# Patient Record
Sex: Female | Born: 1990 | Race: White | Hispanic: No | Marital: Married | Smoking: Former smoker
Health system: Southern US, Community
[De-identification: ages and names within clinical notes are randomized; demographics above are authoritative.]

## PROBLEM LIST (undated history)

## (undated) DIAGNOSIS — O24419 Gestational diabetes mellitus in pregnancy, unspecified control: Secondary | ICD-10-CM

## (undated) DIAGNOSIS — O09299 Supervision of pregnancy with other poor reproductive or obstetric history, unspecified trimester: Secondary | ICD-10-CM

## (undated) DIAGNOSIS — O139 Gestational [pregnancy-induced] hypertension without significant proteinuria, unspecified trimester: Secondary | ICD-10-CM

## (undated) DIAGNOSIS — L309 Dermatitis, unspecified: Secondary | ICD-10-CM

## (undated) DIAGNOSIS — Z8759 Personal history of other complications of pregnancy, childbirth and the puerperium: Secondary | ICD-10-CM

## (undated) HISTORY — DX: Dermatitis, unspecified: L30.9

---

## 2016-06-07 LAB — OB RESULTS CONSOLE ABO/RH: RH Type: POSITIVE

## 2016-06-07 LAB — OB RESULTS CONSOLE ANTIBODY SCREEN: ANTIBODY SCREEN: NEGATIVE

## 2016-06-07 LAB — OB RESULTS CONSOLE PLATELET COUNT: Platelets: 198 10*3/uL

## 2016-06-07 LAB — OB RESULTS CONSOLE GC/CHLAMYDIA
CHLAMYDIA, DNA PROBE: NEGATIVE
Gonorrhea: NEGATIVE

## 2016-06-07 LAB — OB RESULTS CONSOLE HEPATITIS B SURFACE ANTIGEN: HEP B S AG: NEGATIVE

## 2016-06-07 LAB — OB RESULTS CONSOLE HIV ANTIBODY (ROUTINE TESTING): HIV: NONREACTIVE

## 2016-06-07 LAB — OB RESULTS CONSOLE HGB/HCT, BLOOD
HCT: 37 %
HEMOGLOBIN: 12.8 g/dL

## 2016-06-07 LAB — OB RESULTS CONSOLE RUBELLA ANTIBODY, IGM: Rubella: NON-IMMUNE/NOT IMMUNE

## 2016-06-07 LAB — OB RESULTS CONSOLE RPR: RPR: NONREACTIVE

## 2016-09-12 ENCOUNTER — Encounter: Payer: Self-pay | Admitting: Certified Nurse Midwife

## 2016-09-12 ENCOUNTER — Ambulatory Visit (INDEPENDENT_AMBULATORY_CARE_PROVIDER_SITE_OTHER): Payer: 59 | Admitting: Certified Nurse Midwife

## 2016-09-12 VITALS — BP 110/62 | Ht 62.0 in | Wt 160.0 lb

## 2016-09-12 DIAGNOSIS — Z283 Underimmunization status: Secondary | ICD-10-CM

## 2016-09-12 DIAGNOSIS — O9989 Other specified diseases and conditions complicating pregnancy, childbirth and the puerperium: Secondary | ICD-10-CM

## 2016-09-12 DIAGNOSIS — O09899 Supervision of other high risk pregnancies, unspecified trimester: Secondary | ICD-10-CM

## 2016-09-12 DIAGNOSIS — Z3402 Encounter for supervision of normal first pregnancy, second trimester: Secondary | ICD-10-CM

## 2016-09-12 NOTE — Progress Notes (Signed)
New Obstetric Patient H&P    Chief Complaint: "[facility]ring  [facility]"   History of Present Illness: Patient is a 26 y.o. G1P0 Caucasian female, LMP 04/11/2016 presents with husband [name] to [facility] [place]. They will be moving to [place] in the near future. Patient is a Customer service managerreal estate agent.. Based on her  LMP, her EDD is 01/16/17 and her EGA is 6675w0d. Her EDC was confirmed with an 8 week ultrasound.  She had a normal anatomy scan at 18 weeks. Review of her records was accomplished. Labs remarkable for A POS blood type, Rubella non immune, and a negative first trimester test.  CF, SMA, and Fragile X testing was also negative.  Her prenatal care has been uncomplicated thus far.  Her past medical history is notable for eczema   Since her LMP, she admits to the use of tobacco products  No. She is a former smoker. Stopped smoking last year. She claims she has gained   20 pounds since the start of her pregnancy.  There are cats in the home in the home  She admits close contact with children on a regular basis  no  She has had chicken pox in the past yes She has had Tuberculosis exposures, symptoms, or previously tested positive for TB   no Current or past history of domestic violence. no  Genetic Screening/Teratology Counseling: (Includes patient, baby's father, or anyone in either family with:)   1. [facility]'s [facility]= [facility]  no 2. Thalassemia ([place], [place], Mediterranean, or Asian background): MCV<80  no 3. Neural tube defect (meningomyelocele, spina bifida, anencephaly)  no 4. Congenital heart defect  no  5. Down syndrome  no 6. Tay-Sachs (Jewish, [place])  no 7. Canavan's Disease  no 8. Sickle cell disease or trait (African)  no  9. Hemophilia or other blood disorders  no  10. Muscular dystrophy  no  11. Cystic fibrosis  no  12. Huntington's Chorea  no  13. Mental retardation/autism  no 14. Other inherited  genetic or chromosomal disorder  no 15. Maternal metabolic disorder (DM, PKU, etc)  no 16. Patient or FOB with a child with a birth defect not listed above no  16a. Patient or FOB with a birth defect themselves no 17. Recurrent pregnancy loss, or stillbirth  no  18. Any medications since LMP other than prenatal vitamins (include vitamins, supplements, OTC meds, drugs, alcohol)  no 19. Any other genetic/environmental exposure to discuss  no  Infection History:   1. Lives with someone with TB or TB exposed  no  2. Patient or partner has history of genital herpes  no 3. Rash or viral illness since LMP  no 4. History of STI (GC, CT, HPV, syphilis, HIV)  no 5. History of recent travel :  no  Other pertinent information:  yes [name] is husband    Review of Systems:10 point review of systems negative unless otherwise noted in HPI  Past Medical History:  Past Medical History:  Diagnosis Date  . Eczema     Past Surgical History:  Past Surgical History:  Procedure Laterality Date  . NO PAST SURGERIES      Gynecologic History: Patient's last menstrual period was 04/11/2016 (lmp unknown).  Obstetric History: G1P0  Family History:  Family History  Problem Relation Age of Onset  . Breast cancer Neg Hx   . Ovarian cancer Neg Hx  Social History:  Social History   Social History  . Marital status: Married    Spouse name: Deniece Portela  . Number of children: N/A  . Years of education: 57   Occupational History  . Real estate    Social History Main Topics  . Smoking status: Former Smoker    Years: 2.00  . Smokeless tobacco: Never Used     Comment: 1 pack/week-quit 2017  . Alcohol use Yes     Comment: prior to pregnancy  . Drug use: No  . Sexual activity: Yes    Partners: Male   Other Topics Concern  . Not on file   Social History Narrative  . No narrative on file    Allergies:  No Known Allergies  Medications: Prior to Admission medications   Medication Sig  Start Date End Date Taking? Authorizing Provider  Prenatal Vit-Fe Fumarate-FA (MULTIVITAMIN-PRENATAL) 27-0.8 MG TABS tablet Take 1 tablet by mouth daily at 12 noon.   Yes [provider]    Physical Exam Vitals: Blood pressure 110/62, height 5\' 2"  (1.575 m), weight 160 lb (72.6 kg), last menstrual period 04/11/2016.  General: NAD Abdomen: soft, non-tender, FH at 23 cm. FHTs 145. FM palpable Extremities: no edema, erythema, or tenderness Neurologic: Grossly intact Psychiatric: mood appropriate, affect full   Assessment: 26 y.o. G1P0 at [redacted]w[redacted]d presenting to transfer  prenatal care to [facility] Low risk pregnancy Plan: 1) Avoid alcoholic beverages. 2) Patient encouraged not to smoke.  3) Discontinue the use of all non-medicinal drugs and chemicals.  4) Take prenatal vitamins daily.  5) Hospital and practice style delivering at [facility] discussed  6) Patient is asked about travel to areas at risk for the Zika virus, and counseled to avoid travel and exposure to mosquitoes or sexual partners who may have themselves been exposed to the virus. Testing is discussed, and will be ordered as appropriate.  7) Given schedule of childbirth classes at [facility] 8) RTO in 4 weeks for ROB    Patient ID: Michelle Schultz, female   DOB: Apr 22, 1991, 26 y.o.   MRN: [id number]

## 2016-09-12 NOTE — Progress Notes (Signed)
[facility]

## 2016-09-14 DIAGNOSIS — Z98891 History of uterine scar from previous surgery: Secondary | ICD-10-CM | POA: Insufficient documentation

## 2016-09-14 DIAGNOSIS — O09899 Supervision of other high risk pregnancies, unspecified trimester: Secondary | ICD-10-CM | POA: Insufficient documentation

## 2016-09-14 DIAGNOSIS — Z2839 Other underimmunization status: Secondary | ICD-10-CM | POA: Insufficient documentation

## 2016-09-14 DIAGNOSIS — Z283 Underimmunization status: Secondary | ICD-10-CM | POA: Insufficient documentation

## 2016-09-14 DIAGNOSIS — O9989 Other specified diseases and conditions complicating pregnancy, childbirth and the puerperium: Secondary | ICD-10-CM

## 2016-09-14 DIAGNOSIS — O34219 Maternal care for unspecified type scar from previous cesarean delivery: Secondary | ICD-10-CM | POA: Insufficient documentation

## 2016-09-14 NOTE — Progress Notes (Signed)
[place] at [redacted] weeks gestation. See progress note. Prenatal records reviewed: Low risk pregnancy ROB in 4 weeks.

## 2016-09-29 ENCOUNTER — Ambulatory Visit (INDEPENDENT_AMBULATORY_CARE_PROVIDER_SITE_OTHER): Payer: 59 | Admitting: Obstetrics & Gynecology

## 2016-09-29 VITALS — BP 120/80 | Wt 166.0 lb

## 2016-09-29 DIAGNOSIS — Z3A24 24 weeks gestation of pregnancy: Secondary | ICD-10-CM

## 2016-09-29 DIAGNOSIS — Z3402 Encounter for supervision of normal first pregnancy, second trimester: Secondary | ICD-10-CM

## 2016-09-29 NOTE — Progress Notes (Signed)
PNV, FMC, Glucola nv. Breast feeding.  Plans pill or Depo.

## 2016-09-29 NOTE — Patient Instructions (Signed)

## 2016-10-27 ENCOUNTER — Ambulatory Visit (INDEPENDENT_AMBULATORY_CARE_PROVIDER_SITE_OTHER): Payer: 59 | Admitting: Obstetrics and Gynecology

## 2016-10-27 ENCOUNTER — Other Ambulatory Visit: Payer: 59

## 2016-10-27 VITALS — BP 110/70 | Wt 173.0 lb

## 2016-10-27 DIAGNOSIS — Z3A28 28 weeks gestation of pregnancy: Secondary | ICD-10-CM

## 2016-10-27 DIAGNOSIS — Z3402 Encounter for supervision of normal first pregnancy, second trimester: Secondary | ICD-10-CM

## 2016-10-27 DIAGNOSIS — Z3A24 24 weeks gestation of pregnancy: Secondary | ICD-10-CM

## 2016-10-27 NOTE — Progress Notes (Signed)
Pos PNVs, No VB, LOF. Doing well. 28 wks labs today.

## 2016-10-28 ENCOUNTER — Other Ambulatory Visit: Payer: Self-pay | Admitting: Obstetrics & Gynecology

## 2016-10-28 DIAGNOSIS — O24419 Gestational diabetes mellitus in pregnancy, unspecified control: Secondary | ICD-10-CM

## 2016-10-28 LAB — 28 WEEK RH+PANEL
BASOS ABS: 0 10*3/uL (ref 0.0–0.2)
BASOS: 0 %
EOS (ABSOLUTE): 0.1 10*3/uL (ref 0.0–0.4)
EOS: 1 %
GESTATIONAL DIABETES SCREEN: 172 mg/dL — AB (ref 65–139)
HEMATOCRIT: 32.5 % — AB (ref 34.0–46.6)
HIV SCREEN 4TH GENERATION: NONREACTIVE
Hemoglobin: 11 g/dL — ABNORMAL LOW (ref 11.1–15.9)
Immature Grans (Abs): 0.1 10*3/uL (ref 0.0–0.1)
Immature Granulocytes: 1 %
LYMPHS ABS: 1.6 10*3/uL (ref 0.7–3.1)
Lymphs: 19 %
MCH: 29.6 pg (ref 26.6–33.0)
MCHC: 33.8 g/dL (ref 31.5–35.7)
MCV: 87 fL (ref 79–97)
MONOCYTES: 4 %
Monocytes Absolute: 0.3 10*3/uL (ref 0.1–0.9)
NEUTROS ABS: 6.2 10*3/uL (ref 1.4–7.0)
Neutrophils: 75 %
PLATELETS: 136 10*3/uL — AB (ref 150–379)
RBC: 3.72 x10E6/uL — AB (ref 3.77–5.28)
RDW: 13.5 % (ref 12.3–15.4)
RPR: NONREACTIVE
WBC: 8.3 10*3/uL (ref 3.4–10.8)

## 2016-10-28 NOTE — Progress Notes (Signed)
Schedule 3 hour GTT due to abnormal screening lab from this week.  This is to rule out diabetes.  Thank you.

## 2016-10-31 ENCOUNTER — Telehealth: Payer: Self-pay | Admitting: Obstetrics & Gynecology

## 2016-10-31 NOTE — Telephone Encounter (Signed)
Pt is schedule 11/07/16 for lab

## 2016-10-31 NOTE — Telephone Encounter (Signed)
-----   Message from Nadara Mustardobert P Harris, MD sent at 10/28/2016  9:43 AM EDT ----- Schedule 3 hour GTT due to abnormal screening lab from this week.  This is to rule out diabetes.  Thank you.

## 2016-11-07 ENCOUNTER — Other Ambulatory Visit: Payer: 59

## 2016-11-07 DIAGNOSIS — O24419 Gestational diabetes mellitus in pregnancy, unspecified control: Secondary | ICD-10-CM

## 2016-11-08 LAB — GESTATIONAL GLUCOSE TOLERANCE
GLUCOSE 1 HOUR GTT: 162 mg/dL (ref 65–179)
GLUCOSE 2 HOUR GTT: 133 mg/dL (ref 65–154)
GLUCOSE FASTING: 86 mg/dL (ref 65–94)
Glucose, GTT - 3 Hour: 104 mg/dL (ref 65–139)

## 2016-11-11 ENCOUNTER — Ambulatory Visit (INDEPENDENT_AMBULATORY_CARE_PROVIDER_SITE_OTHER): Payer: 59 | Admitting: Obstetrics and Gynecology

## 2016-11-11 VITALS — BP 114/70 | Wt 176.0 lb

## 2016-11-11 DIAGNOSIS — Z3403 Encounter for supervision of normal first pregnancy, third trimester: Secondary | ICD-10-CM

## 2016-11-11 DIAGNOSIS — Z3A30 30 weeks gestation of pregnancy: Secondary | ICD-10-CM

## 2016-11-11 DIAGNOSIS — Z283 Underimmunization status: Secondary | ICD-10-CM

## 2016-11-11 DIAGNOSIS — O9989 Other specified diseases and conditions complicating pregnancy, childbirth and the puerperium: Secondary | ICD-10-CM

## 2016-11-11 DIAGNOSIS — O09899 Supervision of other high risk pregnancies, unspecified trimester: Secondary | ICD-10-CM

## 2016-11-11 NOTE — Progress Notes (Signed)
Prenatal Visit Note Date: 11/11/2016 Clinic: [facility]  Subjective:  Michelle Schultz is a 26 y.o. G1P0 at 7239w4d being seen today for ongoing prenatal care.  She is currently monitored for the following issues for this low-risk pregnancy and has Supervision of low-risk first pregnancy and Rubella non-immune status, antepartum on her problem list.  Patient reports no bleeding, no contractions and no leaking.   Contractions: Not present. Vag. Bleeding: None.  Movement: Present. Denies leaking of fluid.   The following portions of the patient's history were reviewed and updated as appropriate: allergies, current medications, past family history, past medical history, past social history, past surgical history and problem list. Problem list updated.  Objective:   Vitals:   11/11/16 1544  BP: 114/70  Weight: 176 lb (79.8 kg)    Fetal Status: Fetal Heart Rate (bpm): 145 Fundal Height: 30 cm Movement: Present     General:  Alert, oriented and cooperative. Patient is in no acute distress.  Skin: Skin is warm and dry. No rash noted.   Cardiovascular: Normal heart rate noted  Respiratory: Normal respiratory effort, no problems with respiration noted  Abdomen: Soft, gravid, appropriate for gestational age. Pain/Pressure: Absent     Pelvic:  Cervical exam deferred        Extremities: Normal range of motion.  Edema: None  Mental Status: Normal mood and affect. Normal behavior. Normal judgment and thought content.   Urinalysis: Urine Protein: Negative Urine Glucose: Negative  Assessment and Plan:  Pregnancy: G1P0 at 2439w4d  1. Encounter for supervision of low-risk first pregnancy in third trimester 2. Rubella non-immune status, antepartum 3. [redacted] weeks gestation of pregnancy  Preterm labor symptoms and general obstetric precautions including but not limited to vaginal bleeding, contractions, leaking of fluid and fetal movement were reviewed in detail with the patient. Please refer to After  Visit Summary for other counseling recommendations.  Return in about 2 weeks (around 11/25/2016) for Routine Prenatal Appointment.  Thomasene MohairStephen Sharea Guinther, MD 11/11/2016 4:10 PM

## 2016-11-25 ENCOUNTER — Encounter: Payer: 59 | Admitting: Advanced Practice Midwife

## 2016-11-25 ENCOUNTER — Ambulatory Visit (INDEPENDENT_AMBULATORY_CARE_PROVIDER_SITE_OTHER): Payer: 59 | Admitting: Advanced Practice Midwife

## 2016-11-25 VITALS — BP 118/74 | Wt 174.0 lb

## 2016-11-25 DIAGNOSIS — Z3A32 32 weeks gestation of pregnancy: Secondary | ICD-10-CM

## 2016-11-25 NOTE — Progress Notes (Signed)
Doing well. Questions regarding circumcision, TDAP for family members, postdates protocol, size of baby answered. Breastfeeding and [facility]. No LOF, VB.

## 2016-12-09 ENCOUNTER — Ambulatory Visit (INDEPENDENT_AMBULATORY_CARE_PROVIDER_SITE_OTHER): Payer: 59 | Admitting: Obstetrics and Gynecology

## 2016-12-09 VITALS — BP 108/68 | Wt 181.0 lb

## 2016-12-09 DIAGNOSIS — Z3A34 34 weeks gestation of pregnancy: Secondary | ICD-10-CM

## 2016-12-09 DIAGNOSIS — Z3403 Encounter for supervision of normal first pregnancy, third trimester: Secondary | ICD-10-CM

## 2016-12-09 DIAGNOSIS — Z23 Encounter for immunization: Secondary | ICD-10-CM

## 2016-12-09 NOTE — Progress Notes (Signed)
Routine Prenatal Care Visit  Subjective  Michelle Schultz is a 26 y.o. G1P0 at 57w4dbeing seen today for ongoing prenatal care.  She is currently monitored for the following issues for this low-risk pregnancy and has Supervision of low-risk first pregnancy and Rubella non-immune status, antepartum on her problem list.  ----------------------------------------------------------------------------------- Patient reports .   Contractions: Not present. Vag. Bleeding: None.  Movement: Present. Denies leaking of fluid.  ----------------------------------------------------------------------------------- The following portions of the patient's history were reviewed and updated as appropriate: allergies, current medications, past family history, past medical history, past social history, past surgical history and problem list. Problem list updated.  Objective  Blood pressure 108/68, weight 181 lb (82.1 kg), last menstrual period 04/11/2016. Pregravid weight 140 lb (63.5 kg) Total Weight Gain 41 lb (18.6 kg) Urinalysis: Urine Protein: Negative Urine Glucose: Negative  Fetal Status: Fetal Heart Rate (bpm): 135 Fundal Height: 34 cm Movement: Present     General:  Alert, oriented and cooperative. Patient is in no acute distress.  Skin: Skin is warm and dry. No rash noted.   Cardiovascular: Normal heart rate noted  Respiratory: Normal respiratory effort, no problems with respiration noted  Abdomen: Soft, gravid, appropriate for gestational age. Pain/Pressure: Absent     Pelvic:  Cervical exam deferred        Extremities: Normal range of motion.     ental Status: Normal mood and affect. Normal behavior. Normal judgment and thought content.   Assessment   26y.o. G1P0 at 310w4dy  01/16/2017, by Last Menstrual Period presenting for routine prenatal visit  Plan   pregnancy #1 Problems (from 04/17/16 to present)    Problem Noted Resolved   Supervision of low-risk first pregnancy 09/14/2016 by  GuDalia HeadingCNM No   Overview Addendum 12/09/2016  2:11 PM by JaWill BonnetMD     Clinic  Prenatal Labs  Dating LMP=8wk ultrasound Blood type: A/Positive/-- (02/06 0000)   Genetic Screen 1 Screen: neg   AFP:     Quad:     NIPS: Antibody:Negative (02/06 0000)  Anatomic [place]/18/2018 WNL, posterior placenta Rubella: Nonimmune (02/06 0000)  GTT Third trimester: 172, passed 3h gtt all values normal RPR: Nonreactive (02/06 0000)   Flu vaccine  HBsAg: Negative (02/06 0000)   TDaP vaccine  12/09/16                            Rhogam: HIV: Non-reactive (02/06 0000)   Baby Food                                               GBS: (For PCN allergy, check sensitivities)  Contraception  Pap:  Circumcision    Pediatrician    Support Person              Rubella non-immune status, antepartum 09/14/2016 by GuDalia HeadingCNM No   Overview Signed 09/14/2016  8:15 PM by GuDalia HeadingCNM    MMR postpartum        Preterm labor symptoms and general obstetric precautions including but not limited to vaginal bleeding, contractions, leaking of fluid and fetal movement were reviewed in detail with the patient. Please refer to After Visit Summary for other counseling recommendations.   Return in about 2 weeks (around 12/23/2016) for Routine Prenatal Appointment.  [name]  12/09/2016 2:11 PM

## 2016-12-22 ENCOUNTER — Encounter: Payer: 59 | Admitting: Advanced Practice Midwife

## 2016-12-23 ENCOUNTER — Ambulatory Visit (INDEPENDENT_AMBULATORY_CARE_PROVIDER_SITE_OTHER): Payer: 59 | Admitting: Maternal Newborn

## 2016-12-23 VITALS — BP 120/80 | Wt 184.0 lb

## 2016-12-23 DIAGNOSIS — Z283 Underimmunization status: Secondary | ICD-10-CM

## 2016-12-23 DIAGNOSIS — O9989 Other specified diseases and conditions complicating pregnancy, childbirth and the puerperium: Secondary | ICD-10-CM

## 2016-12-23 DIAGNOSIS — Z3A36 36 weeks gestation of pregnancy: Secondary | ICD-10-CM

## 2016-12-23 DIAGNOSIS — Z3403 Encounter for supervision of normal first pregnancy, third trimester: Secondary | ICD-10-CM

## 2016-12-23 DIAGNOSIS — Z2839 Other underimmunization status: Secondary | ICD-10-CM

## 2016-12-23 NOTE — Addendum Note (Signed)
Addended by: Marcelyn Bruins on: 12/23/2016 01:41 PM   Modules accepted: Orders

## 2016-12-23 NOTE — Progress Notes (Signed)
    Routine Prenatal Care Visit  Subjective  Kaleiah Kutzer is a 26 y.o. G1P0 at 60w4dbeing seen today for ongoing prenatal care.  She is currently monitored for the following issues for this low-risk pregnancy and has Supervision of low-risk first pregnancy and Rubella non-immune status, antepartum on her problem list.  ----------------------------------------------------------------------------------- Patient reports no complaints.   Denies leaking of fluid, vaginal bleeding, contractions. Good fetal movement. ----------------------------------------------------------------------------------- The following portions of the patient's history were reviewed and updated as appropriate: allergies, current medications, past family history, past medical history, past social history, past surgical history and problem list. Problem list updated.   Objective  Last menstrual period 04/11/2016. Pregravid weight 140 lb (63.5 kg) Total Weight Gain 44 lb (20 kg) Urinalysis: Urine Protein: Negative Urine Glucose: Negative  Fetal Status: Fetal Heart Rate (bpm): 160 Fundal Height: 35 cm Movement: Present     General:  Alert, oriented and cooperative. Patient is in no acute distress.  Skin: Skin is warm and dry. No rash noted.   Cardiovascular: Normal heart rate noted  Respiratory: Normal respiratory effort, no problems with respiration noted  Abdomen: Soft, gravid, appropriate for gestational age. Pain/Pressure: Absent     Pelvic:  Cervical exam deferred        Extremities: Normal range of motion.  Edema: None  ental Status: Normal mood and affect. Normal behavior. Normal judgment and thought content.     Assessment   26y.o. G1P0 at 330w4dy  01/16/2017, by Last Menstrual Period presenting for routine prenatal visit  Plan   Pregnancy #1 Problems (from 04/17/16 to present)    Problem Noted Resolved   Supervision of low-risk first pregnancy 09/14/2016 by GuDalia HeadingCNM No   Overview  Addendum 12/09/2016  2:11 PM by JaWill BonnetMD     Clinic [facility] Prenatal Labs  Dating LMP=8wk ultrasound Blood type: A/Positive/-- (02/06 0000)   Genetic Screen 1 Screen: neg   AFP:     Quad:     NIPS: Antibody:Negative (02/06 0000)  Anatomic [place]/18/2018 WNL, posterior placenta Rubella: Nonimmune (02/06 0000)  GTT Third trimester: 172, passed 3h gtt all values normal RPR: Nonreactive (02/06 0000)   Flu vaccine  HBsAg: Negative (02/06 0000)   TDaP vaccine  12/09/16                            Rhogam: HIV: Non-reactive (02/06 0000)   Baby Food Breast                                    GBS: (For PCN allergy, check sensitivities)  Contraception Pill/depo Pap:  Circumcision    Pediatrician    Support Person              Rubella non-immune status, antepartum 09/14/2016 by GuDalia HeadingCNM No   Overview Signed 09/14/2016  8:15 PM by GuDalia HeadingCNM    MMR postpartum          Preterm labor symptoms and general obstetric precautions including but not limited to vaginal bleeding, contractions, leaking of fluid and fetal movement were reviewed in detail with the patient. Please refer to After Visit Summary for other counseling recommendations.   Return in about 1 week (around 12/30/2016) for [place]  JaRexene Agent

## 2016-12-25 LAB — STREP GP B NAA: Strep Gp B NAA: POSITIVE — AB

## 2016-12-30 ENCOUNTER — Ambulatory Visit (INDEPENDENT_AMBULATORY_CARE_PROVIDER_SITE_OTHER): Payer: 59 | Admitting: Advanced Practice Midwife

## 2016-12-30 VITALS — BP 128/80 | Wt 186.0 lb

## 2016-12-30 DIAGNOSIS — Z3A37 37 weeks gestation of pregnancy: Secondary | ICD-10-CM

## 2016-12-30 NOTE — Progress Notes (Signed)
ROB

## 2016-12-30 NOTE — Progress Notes (Signed)
Good fetal movement, no complaints. Denies LOF, VB, CTX's. Discussed GBS positive status and protocol. Patient and husband aware of labor precautions. ROB in 1 week.

## 2017-01-06 ENCOUNTER — Ambulatory Visit (INDEPENDENT_AMBULATORY_CARE_PROVIDER_SITE_OTHER): Payer: 59 | Admitting: Maternal Newborn

## 2017-01-06 VITALS — BP 120/80 | Wt 187.0 lb

## 2017-01-06 DIAGNOSIS — Z3403 Encounter for supervision of normal first pregnancy, third trimester: Secondary | ICD-10-CM

## 2017-01-06 DIAGNOSIS — Z3A38 38 weeks gestation of pregnancy: Secondary | ICD-10-CM

## 2017-01-06 DIAGNOSIS — Z283 Underimmunization status: Secondary | ICD-10-CM

## 2017-01-06 DIAGNOSIS — Z2839 Other underimmunization status: Secondary | ICD-10-CM

## 2017-01-06 DIAGNOSIS — O9989 Other specified diseases and conditions complicating pregnancy, childbirth and the puerperium: Secondary | ICD-10-CM

## 2017-01-06 NOTE — Progress Notes (Signed)
Routine Prenatal Care Visit  Subjective  Michelle Schultz is a 26 y.o. G1P0 at [redacted]w[redacted]d being seen today for ongoing prenatal care.  She is currently monitored for the following issues for this low-risk pregnancy and has Supervision of low-risk first pregnancy; Rubella non-immune status, antepartum; and [redacted] weeks gestation of pregnancy on her problem list.  ----------------------------------------------------------------------------------- Patient reports no complaints.   Contractions: Not present. Vag. Bleeding: None.  Movement: Present. Denies leaking of fluid.  ----------------------------------------------------------------------------------- The following portions of the patient's history were reviewed and updated as appropriate: allergies, current medications, past family history, past medical history, past social history, past surgical history and problem list. Problem list updated.   Objective  Last menstrual period 04/11/2016. Pregravid weight 140 lb (63.5 kg) Total Weight Gain 47 lb (21.3 kg)  Fetal Status: Fetal Heart Rate (bpm): 145 Fundal Height: 38 cm Movement: Present     General:  Alert, oriented and cooperative. Patient is in no acute distress.  Skin: Skin is warm and dry. No rash noted.   Cardiovascular: Normal heart rate noted  Respiratory: Normal respiratory effort, no problems with respiration noted  Abdomen: Soft, gravid, appropriate for gestational age. Pain/Pressure: Present     Pelvic:  Cervical exam performed Dilation: Closed Effacement (%): Thick Station: Ballotable  Extremities: Normal range of motion.     ental Status: Normal mood and affect. Normal behavior. Normal judgment and thought content.     Assessment   26 y.o. G1P0 at [redacted]w[redacted]d by  01/16/2017, by Last Menstrual Period presenting for routine prenatal visit  Plan   pregnancy #1 Problems (from 04/17/16 to present)    Problem Noted Resolved   Supervision of low-risk first pregnancy 09/14/2016 by  Gutierrez, Colleen, CNM No   Overview Addendum 01/06/2017 11:12 AM by Schmid, Jacelyn Y, CNM     Clinic [facility] Prenatal Labs  Dating LMP=8wk ultrasound Blood type: A/Positive/-- (02/06 0000)   Genetic Screen 1 Screen: neg   AFP:     Quad:     NIPS: Antibody:Negative (02/06 0000)  Anatomic US 08/17/2016 WNL, posterior placenta Rubella: Nonimmune (02/06 0000)  GTT Third trimester: 172, passed 3h gtt all values normal RPR: Non Reactive (06/28 0940)   Flu vaccine  HBsAg: Negative (02/06 0000)   TDaP vaccine  12/09/16                            Rhogam: N/A HIV: Non-reactive (02/06 0000)   Baby Food Breast                                      GBS:Positive (08/24 1515)(For PCN allergy, check sensitivities)  Contraception Pill/depo Pap:  Circumcision    Pediatrician    Support Person              Rubella non-immune status, antepartum 09/14/2016 by Gutierrez, Colleen, CNM No   Overview Signed 09/14/2016  8:15 PM by Gutierrez, Colleen, CNM    MMR postpartum          Term labor symptoms and general obstetric precautions including but not limited to vaginal bleeding, contractions, leaking of fluid and fetal movement were reviewed in detail with the patient.  Please refer to After Visit Summary for other counseling recommendations.   Return in about 1 week (around 01/13/2017) for ROB.   Jacelyn Schmid, CNM 01/06/2017  1:46 PM 

## 2017-01-12 ENCOUNTER — Ambulatory Visit (INDEPENDENT_AMBULATORY_CARE_PROVIDER_SITE_OTHER): Payer: 59 | Admitting: Maternal Newborn

## 2017-01-12 VITALS — BP 120/80 | Wt 186.0 lb

## 2017-01-12 DIAGNOSIS — Z3A39 39 weeks gestation of pregnancy: Secondary | ICD-10-CM

## 2017-01-12 DIAGNOSIS — Z3403 Encounter for supervision of normal first pregnancy, third trimester: Secondary | ICD-10-CM

## 2017-01-12 NOTE — Progress Notes (Signed)
  Routine Prenatal Care Visit  Subjective  Michelle Schultz is a 26 y.o. G1P0 at [redacted]w[redacted]d being seen today for ongoing prenatal care.  She is currently monitored for the following issues for this low-risk pregnancy and has Supervision of low-risk first pregnancy and Rubella non-immune status, antepartum on her problem list.  ----------------------------------------------------------------------------------- Patient reports no complaints.   Contractions: Not present. Vag. Bleeding: None.  Movement: Present. Denies leaking of fluid.  ----------------------------------------------------------------------------------- The following portions of the patient's history were reviewed and updated as appropriate: allergies, current medications, past family history, past medical history, past social history, past surgical history and problem list. Problem list updated.   Objective  Blood pressure 120/80, weight 186 lb (84.4 kg), last menstrual period 04/11/2016. Pregravid weight 140 lb (63.5 kg) Total Weight Gain 46 lb (20.9 kg) Urinalysis: Urine Protein: Negative Urine Glucose: Negative  Fetal Status: Fetal Heart Rate (bpm): 150 Fundal Height: 39 cm Movement: Present     General:  Alert, oriented and cooperative. Patient is in no acute distress.  Skin: Skin is warm and dry. No rash noted.   Cardiovascular: Normal heart rate noted  Respiratory: Normal respiratory effort, no problems with respiration noted  Abdomen: Soft, gravid, appropriate for gestational age. Pain/Pressure: Absent     Pelvic:  Cervical exam performed Dilation: Closed Effacement (%): 20 Station: -3  Extremities: Normal range of motion.  Edema: Trace  Mental Status: Normal mood and affect. Normal behavior. Normal judgment and thought content.     Assessment   26 y.o. G1P0 at [redacted]w[redacted]d by  01/16/2017, by Last Menstrual Period presenting for routine prenatal visit  Plan   pregnancy #1 Problems (from 04/17/16 to present)    Problem  Noted Resolved   Supervision of low-risk first pregnancy 09/14/2016 by Gutierrez, Colleen, CNM No   Overview Addendum 01/06/2017 11:12 AM by Schmid, Jacelyn Y, CNM     Clinic [facility] Prenatal Labs  Dating LMP=8wk ultrasound Blood type: A/Positive/-- (02/06 0000)   Genetic Screen 1 Screen: neg   AFP:     Quad:     NIPS: Antibody:Negative (02/06 0000)  Anatomic US 08/17/2016 WNL, posterior placenta Rubella: Nonimmune (02/06 0000)  GTT Third trimester: 172, passed 3h gtt all values normal RPR: Non Reactive (06/28 0940)   Flu vaccine  HBsAg: Negative (02/06 0000)   TDaP vaccine  12/09/16                            Rhogam: N/A HIV: Non-reactive (02/06 0000)   Baby Food Breast                                      GBS:Positive (08/24 1515)(For PCN allergy, check sensitivities)  Contraception Pill/depo Pap:  Circumcision    Pediatrician    Support Person              Rubella non-immune status, antepartum 09/14/2016 by Gutierrez, Colleen, CNM No   Overview Signed 09/14/2016  8:15 PM by Gutierrez, Colleen, CNM    MMR postpartum          Term labor symptoms and general obstetric precautions including but not limited to vaginal bleeding, contractions, leaking of fluid and fetal movement were reviewed in detail with the patient.  Average length of first time pregnancy and indications/timing of IOL discussed.  Return in about 1 week (around 01/19/2017) for ROB.  Jacelyn   Patrcia Dolly, CNM 01/12/2017  10:47 AM

## 2017-01-13 ENCOUNTER — Encounter: Payer: 59 | Admitting: Maternal Newborn

## 2017-01-19 ENCOUNTER — Inpatient Hospital Stay (HOSPITAL_COMMUNITY): Payer: 59 | Admitting: Anesthesiology

## 2017-01-19 ENCOUNTER — Ambulatory Visit (INDEPENDENT_AMBULATORY_CARE_PROVIDER_SITE_OTHER): Payer: 59 | Admitting: Maternal Newborn

## 2017-01-19 ENCOUNTER — Encounter (HOSPITAL_COMMUNITY): Payer: Self-pay | Admitting: *Deleted

## 2017-01-19 ENCOUNTER — Inpatient Hospital Stay (HOSPITAL_COMMUNITY)
Admission: AD | Admit: 2017-01-19 | Discharge: 2017-01-24 | DRG: 765 | Disposition: A | Discharge status: Home / Self Care | Payer: 59 | Source: Ambulatory Visit | Attending: Obstetrics & Gynecology | Admitting: Obstetrics & Gynecology

## 2017-01-19 ENCOUNTER — Inpatient Hospital Stay
Admission: EM | Admit: 2017-01-19 | Discharge: 2017-01-19 | DRG: 781 | Disposition: A | Discharge status: Nursing Home (Includes ICF) | Payer: 59 | Attending: Obstetrics and Gynecology | Admitting: Obstetrics and Gynecology

## 2017-01-19 ENCOUNTER — Other Ambulatory Visit: Payer: Self-pay | Admitting: Obstetrics and Gynecology

## 2017-01-19 VITALS — BP 156/120 | Wt 189.0 lb

## 2017-01-19 DIAGNOSIS — O99820 Streptococcus B carrier state complicating pregnancy: Secondary | ICD-10-CM | POA: Diagnosis present

## 2017-01-19 DIAGNOSIS — O1423 HELLP syndrome (HELLP), third trimester: Secondary | ICD-10-CM

## 2017-01-19 DIAGNOSIS — Z87891 Personal history of nicotine dependence: Secondary | ICD-10-CM

## 2017-01-19 DIAGNOSIS — O411230 Chorioamnionitis, third trimester, not applicable or unspecified: Secondary | ICD-10-CM | POA: Diagnosis not present

## 2017-01-19 DIAGNOSIS — R0602 Shortness of breath: Secondary | ICD-10-CM

## 2017-01-19 DIAGNOSIS — O163 Unspecified maternal hypertension, third trimester: Secondary | ICD-10-CM

## 2017-01-19 DIAGNOSIS — Z3A40 40 weeks gestation of pregnancy: Secondary | ICD-10-CM

## 2017-01-19 DIAGNOSIS — D62 Acute posthemorrhagic anemia: Secondary | ICD-10-CM | POA: Diagnosis not present

## 2017-01-19 DIAGNOSIS — Z2839 Other underimmunization status: Secondary | ICD-10-CM

## 2017-01-19 DIAGNOSIS — O98919 Unspecified maternal infectious and parasitic disease complicating pregnancy, unspecified trimester: Secondary | ICD-10-CM | POA: Diagnosis not present

## 2017-01-19 DIAGNOSIS — Z98891 History of uterine scar from previous surgery: Secondary | ICD-10-CM

## 2017-01-19 DIAGNOSIS — Z880 Allergy status to penicillin: Secondary | ICD-10-CM

## 2017-01-19 DIAGNOSIS — O09899 Supervision of other high risk pregnancies, unspecified trimester: Secondary | ICD-10-CM

## 2017-01-19 DIAGNOSIS — D6959 Other secondary thrombocytopenia: Secondary | ICD-10-CM | POA: Diagnosis not present

## 2017-01-19 DIAGNOSIS — O99824 Streptococcus B carrier state complicating childbirth: Secondary | ICD-10-CM | POA: Diagnosis present

## 2017-01-19 DIAGNOSIS — O9989 Other specified diseases and conditions complicating pregnancy, childbirth and the puerperium: Secondary | ICD-10-CM

## 2017-01-19 DIAGNOSIS — Z3403 Encounter for supervision of normal first pregnancy, third trimester: Secondary | ICD-10-CM

## 2017-01-19 DIAGNOSIS — O9912 Other diseases of the blood and blood-forming organs and certain disorders involving the immune mechanism complicating childbirth: Secondary | ICD-10-CM | POA: Diagnosis present

## 2017-01-19 DIAGNOSIS — O1424 HELLP syndrome, complicating childbirth: Principal | ICD-10-CM | POA: Diagnosis present

## 2017-01-19 DIAGNOSIS — O9081 Anemia of the puerperium: Secondary | ICD-10-CM | POA: Diagnosis not present

## 2017-01-19 DIAGNOSIS — R7989 Other specified abnormal findings of blood chemistry: Secondary | ICD-10-CM | POA: Diagnosis not present

## 2017-01-19 DIAGNOSIS — R030 Elevated blood-pressure reading, without diagnosis of hypertension: Secondary | ICD-10-CM | POA: Diagnosis present

## 2017-01-19 DIAGNOSIS — Z283 Underimmunization status: Secondary | ICD-10-CM

## 2017-01-19 DIAGNOSIS — O34219 Maternal care for unspecified type scar from previous cesarean delivery: Secondary | ICD-10-CM

## 2017-01-19 DIAGNOSIS — O324XX0 Maternal care for high head at term, not applicable or unspecified: Secondary | ICD-10-CM | POA: Diagnosis present

## 2017-01-19 LAB — PROTEIN / CREATININE RATIO, URINE
Creatinine, Urine: 48 mg/dL
PROTEIN CREATININE RATIO: 0.27 mg/mg{creat} — AB (ref 0.00–0.15)
Total Protein, Urine: 13 mg/dL

## 2017-01-19 LAB — COMPREHENSIVE METABOLIC PANEL
ALBUMIN: 3.1 g/dL — AB (ref 3.5–5.0)
ALK PHOS: 230 U/L — AB (ref 38–126)
ALT: 16 U/L (ref 14–54)
ALT: 17 U/L (ref 14–54)
AST: 22 U/L (ref 15–41)
AST: 22 U/L (ref 15–41)
Albumin: 3.2 g/dL — ABNORMAL LOW (ref 3.5–5.0)
Alkaline Phosphatase: 201 U/L — ABNORMAL HIGH (ref 38–126)
Anion gap: 10 (ref 5–15)
Anion gap: 12 (ref 5–15)
BILIRUBIN TOTAL: 0.5 mg/dL (ref 0.3–1.2)
BUN: 9 mg/dL (ref 6–20)
BUN: 7 mg/dL (ref 6–20)
CALCIUM: 9 mg/dL (ref 8.9–10.3)
CALCIUM: 8 mg/dL — AB (ref 8.9–10.3)
CHLORIDE: 109 mmol/L (ref 101–111)
CO2: 17 mmol/L — ABNORMAL LOW (ref 22–32)
CO2: 18 mmol/L — AB (ref 22–32)
CREATININE: 0.73 mg/dL (ref 0.44–1.00)
CREATININE: 0.67 mg/dL (ref 0.44–1.00)
Chloride: 108 mmol/L (ref 101–111)
GFR calc Af Amer: >60 mL/min (ref >60)
GFR calc non Af Amer: >60 mL/min (ref >60)
GLUCOSE: 97 mg/dL (ref 65–99)
Glucose, Bld: 88 mg/dL (ref 65–99)
Potassium: 3.7 mmol/L (ref 3.5–5.1)
Potassium: 3.8 mmol/L (ref 3.5–5.1)
SODIUM: 138 mmol/L (ref 135–145)
Sodium: 136 mmol/L (ref 135–145)
TOTAL PROTEIN: 6.7 g/dL (ref 6.5–8.1)
Total Bilirubin: 0.5 mg/dL (ref 0.3–1.2)
Total Protein: 6.7 g/dL (ref 6.5–8.1)

## 2017-01-19 LAB — CBC WITH DIFFERENTIAL/PLATELET
BASOS ABS: 0 10*3/uL (ref 0–0.1)
Basophils Relative: 0 %
EOS PCT: 1 %
Eosinophils Absolute: 0.1 10*3/uL (ref 0–0.7)
HEMATOCRIT: 35.6 % (ref 35.0–47.0)
Hemoglobin: 12.3 g/dL (ref 12.0–16.0)
LYMPHS PCT: 18 %
Lymphs Abs: 1.5 10*3/uL (ref 1.0–3.6)
MCH: 29.6 pg (ref 26.0–34.0)
MCHC: 34.4 g/dL (ref 32.0–36.0)
MCV: 85.9 fL (ref 80.0–100.0)
Monocytes Absolute: 0.5 10*3/uL (ref 0.2–0.9)
Monocytes Relative: 6 %
NEUTROS ABS: 6.2 10*3/uL (ref 1.4–6.5)
Neutrophils Relative %: 75 %
PLATELETS: 93 10*3/uL — AB (ref 150–440)
RBC: 4.15 MIL/uL (ref 3.80–5.20)
RDW: 13.6 % (ref 11.5–14.5)
WBC: 8.3 10*3/uL (ref 3.6–11.0)

## 2017-01-19 LAB — CBC
HEMATOCRIT: 34.6 % — AB (ref 36.0–46.0)
Hemoglobin: 11.7 g/dL — ABNORMAL LOW (ref 12.0–15.0)
MCH: 29.3 pg (ref 26.0–34.0)
MCHC: 33.8 g/dL (ref 30.0–36.0)
MCV: 86.5 fL (ref 78.0–100.0)
Platelets: 122 10*3/uL — ABNORMAL LOW (ref 150–400)
RBC: 4 MIL/uL (ref 3.87–5.11)
RDW: 13.7 % (ref 11.5–15.5)
WBC: 10.5 10*3/uL (ref 4.0–10.5)

## 2017-01-19 LAB — TYPE AND SCREEN
ABO/RH(D): A POS
Antibody Screen: NEGATIVE

## 2017-01-19 LAB — PROTIME-INR
INR: 1.02
Prothrombin Time: 13.4 seconds (ref 11.4–15.2)

## 2017-01-19 LAB — FIBRINOGEN: Fibrinogen: 510 mg/dL — ABNORMAL HIGH (ref 210–475)

## 2017-01-19 LAB — APTT: aPTT: 22 seconds — ABNORMAL LOW (ref 24–36)

## 2017-01-19 LAB — ABO/RH: ABO/RH(D): A POS

## 2017-01-19 MED ORDER — OXYCODONE-ACETAMINOPHEN 5-325 MG PO TABS: 2.0000 | ORAL_TABLET | ORAL | Status: DC | PRN

## 2017-01-19 MED ORDER — FENTANYL CITRATE (PF) 100 MCG/2ML IJ SOLN
50.0000 ug | INTRAMUSCULAR | Status: DC | PRN
Start: 2017-01-19 — End: 2017-01-22

## 2017-01-19 MED ORDER — OXYTOCIN BOLUS FROM INFUSION: 500.0000 mL | Freq: Once | INTRAVENOUS | Status: DC

## 2017-01-19 MED ORDER — ONDANSETRON HCL 4 MG/2ML IJ SOLN: 4.0000 mg | Freq: Four times a day (QID) | INTRAMUSCULAR | Status: DC | PRN

## 2017-01-19 MED ORDER — ACETAMINOPHEN 325 MG PO TABS: 650.0000 mg | ORAL_TABLET | ORAL | Status: DC | PRN

## 2017-01-19 MED ORDER — LIDOCAINE HCL (PF) 1 % IJ SOLN
INTRAMUSCULAR | Status: DC | PRN
  Administered 2017-01-19: 5 mL

## 2017-01-19 MED ORDER — MISOPROSTOL 25 MCG QUARTER TABLET: 25.0000 ug | ORAL_TABLET | ORAL | Status: DC | PRN

## 2017-01-19 MED ORDER — LACTATED RINGERS IV SOLN: 500.0000 mL | INTRAVENOUS | Status: DC | PRN

## 2017-01-19 MED ORDER — SOD CITRATE-CITRIC ACID 500-334 MG/5ML PO SOLN: 30.0000 mL | ORAL | Status: DC | PRN

## 2017-01-19 MED ORDER — LACTATED RINGERS IV SOLN
INTRAVENOUS | Status: DC
  Administered 2017-01-20 – 2017-01-21 (×3): via INTRAVENOUS

## 2017-01-19 MED ORDER — CALCIUM GLUCONATE 10 % IV SOLN
INTRAVENOUS | Status: AC
  Filled 2017-01-19: qty 10

## 2017-01-19 MED ORDER — ONDANSETRON HCL 4 MG/2ML IJ SOLN
4.0000 mg | Freq: Four times a day (QID) | INTRAMUSCULAR | Status: DC | PRN
  Administered 2017-01-20 – 2017-01-21 (×2): 4 mg via INTRAVENOUS
  Filled 2017-01-19 (×2): qty 2

## 2017-01-19 MED ORDER — PENICILLIN G POT IN DEXTROSE 60000 UNIT/ML IV SOLN
3.0000 10*6.[IU] | INTRAVENOUS | Status: DC
  Administered 2017-01-19 – 2017-01-21 (×11): 3 10*6.[IU] via INTRAVENOUS
  Filled 2017-01-19 (×14): qty 50

## 2017-01-19 MED ORDER — EPHEDRINE 5 MG/ML INJ: 10.0000 mg | INTRAVENOUS | Status: DC | PRN

## 2017-01-19 MED ORDER — FENTANYL CITRATE (PF) 100 MCG/2ML IJ SOLN: 100.0000 ug | INTRAMUSCULAR | Status: DC | PRN

## 2017-01-19 MED ORDER — MAGNESIUM SULFATE 40 G IN LACTATED RINGERS - SIMPLE
2.0000 g/h | INTRAVENOUS | Status: DC
  Administered 2017-01-20 – 2017-01-21 (×2): 2 g/h via INTRAVENOUS
  Filled 2017-01-19: qty 40
  Filled 2017-01-19 (×2): qty 500

## 2017-01-19 MED ORDER — DIPHENHYDRAMINE HCL 50 MG/ML IJ SOLN: 12.5000 mg | INTRAMUSCULAR | Status: DC | PRN

## 2017-01-19 MED ORDER — AMMONIA AROMATIC IN INHA
0.3000 mL | Freq: Once | RESPIRATORY_TRACT | Status: DC | PRN
  Filled 2017-01-19: qty 10

## 2017-01-19 MED ORDER — LACTATED RINGERS IV SOLN
500.0000 mL | Freq: Once | INTRAVENOUS | Status: AC
  Administered 2017-01-19: 500 mL via INTRAVENOUS

## 2017-01-19 MED ORDER — MISOPROSTOL 200 MCG PO TABS
800.0000 ug | ORAL_TABLET | Freq: Once | ORAL | Status: DC | PRN
  Filled 2017-01-19: qty 4

## 2017-01-19 MED ORDER — PENICILLIN G POTASSIUM 5000000 UNITS IJ SOLR: 5.0000 10*6.[IU] | Freq: Once | INTRAVENOUS | Status: DC

## 2017-01-19 MED ORDER — OXYTOCIN 40 UNITS IN LACTATED RINGERS INFUSION - SIMPLE MED: 2.5000 [IU]/h | INTRAVENOUS | Status: DC

## 2017-01-19 MED ORDER — TERBUTALINE SULFATE 1 MG/ML IJ SOLN: 0.2500 mg | Freq: Once | INTRAMUSCULAR | Status: DC | PRN

## 2017-01-19 MED ORDER — MISOPROSTOL 50MCG HALF TABLET
50.0000 ug | ORAL_TABLET | ORAL | Status: DC | PRN
  Administered 2017-01-19 – 2017-01-20 (×2): 50 ug via ORAL
  Filled 2017-01-19 (×2): qty 1

## 2017-01-19 MED ORDER — LACTATED RINGERS IV SOLN
INTRAVENOUS | Status: DC
  Administered 2017-01-19: 13:00:00 via INTRAVENOUS

## 2017-01-19 MED ORDER — LIDOCAINE HCL (PF) 1 % IJ SOLN
30.0000 mL | INTRAMUSCULAR | Status: DC | PRN
  Filled 2017-01-19: qty 30

## 2017-01-19 MED ORDER — FENTANYL 2.5 MCG/ML BUPIVACAINE 1/10 % EPIDURAL INFUSION (WH - ANES)
14.0000 mL/h | INTRAMUSCULAR | Status: DC | PRN
  Administered 2017-01-19: 14 mL/h via EPIDURAL
  Administered 2017-01-20: 12 mL/h via EPIDURAL
  Administered 2017-01-20 – 2017-01-21 (×3): 14 mL/h via EPIDURAL
  Filled 2017-01-19 (×3): qty 100

## 2017-01-19 MED ORDER — LABETALOL HCL 5 MG/ML IV SOLN: 20.0000 mg | INTRAVENOUS | Status: DC | PRN

## 2017-01-19 MED ORDER — PENICILLIN G POT IN DEXTROSE 60000 UNIT/ML IV SOLN
3.0000 10*6.[IU] | INTRAVENOUS | Status: DC
  Administered 2017-01-19: 3 10*6.[IU] via INTRAVENOUS
  Filled 2017-01-19 (×7): qty 50

## 2017-01-19 MED ORDER — PENICILLIN G POT IN DEXTROSE 60000 UNIT/ML IV SOLN
3.0000 10*6.[IU] | INTRAVENOUS | Status: DC
  Filled 2017-01-19: qty 50

## 2017-01-19 MED ORDER — LACTATED RINGERS IV SOLN: INTRAVENOUS | Status: DC

## 2017-01-19 MED ORDER — PENICILLIN G POTASSIUM 5000000 UNITS IJ SOLR
5.0000 10*6.[IU] | Freq: Once | INTRAVENOUS | Status: AC
  Administered 2017-01-19: 5 10*6.[IU] via INTRAVENOUS
  Filled 2017-01-19 (×2): qty 5

## 2017-01-19 MED ORDER — FENTANYL 2.5 MCG/ML BUPIVACAINE 1/10 % EPIDURAL INFUSION (WH - ANES)
INTRAMUSCULAR | Status: AC
  Administered 2017-01-19: 14 mL/h via EPIDURAL
  Filled 2017-01-19: qty 100

## 2017-01-19 MED ORDER — SOD CITRATE-CITRIC ACID 500-334 MG/5ML PO SOLN
30.0000 mL | ORAL | Status: DC | PRN
  Administered 2017-01-21: 30 mL via ORAL
  Filled 2017-01-19: qty 15

## 2017-01-19 MED ORDER — PHENYLEPHRINE 40 MCG/ML (10ML) SYRINGE FOR IV PUSH (FOR BLOOD PRESSURE SUPPORT)
PREFILLED_SYRINGE | INTRAVENOUS | Status: AC
  Filled 2017-01-19: qty 20

## 2017-01-19 MED ORDER — PHENYLEPHRINE 40 MCG/ML (10ML) SYRINGE FOR IV PUSH (FOR BLOOD PRESSURE SUPPORT)
80.0000 ug | PREFILLED_SYRINGE | INTRAVENOUS | Status: DC | PRN
  Filled 2017-01-19 (×2): qty 10

## 2017-01-19 MED ORDER — MAGNESIUM SULFATE 50 % IJ SOLN
2.0000 g/h | INTRAVENOUS | Status: DC
  Filled 2017-01-19: qty 80

## 2017-01-19 MED ORDER — OXYCODONE-ACETAMINOPHEN 5-325 MG PO TABS: 1.0000 | ORAL_TABLET | ORAL | Status: DC | PRN

## 2017-01-19 MED ORDER — PHENYLEPHRINE 40 MCG/ML (10ML) SYRINGE FOR IV PUSH (FOR BLOOD PRESSURE SUPPORT): 80.0000 ug | PREFILLED_SYRINGE | INTRAVENOUS | Status: DC | PRN

## 2017-01-19 MED ORDER — ACETAMINOPHEN 325 MG PO TABS
650.0000 mg | ORAL_TABLET | ORAL | Status: DC | PRN
  Administered 2017-01-20 – 2017-01-21 (×2): 650 mg via ORAL
  Filled 2017-01-19 (×3): qty 2

## 2017-01-19 MED ORDER — MAGNESIUM SULFATE BOLUS VIA INFUSION
4.0000 g | Freq: Once | INTRAVENOUS | Status: AC
  Administered 2017-01-19: 4 g via INTRAVENOUS
  Filled 2017-01-19: qty 500

## 2017-01-19 MED ORDER — OXYTOCIN 40 UNITS IN LACTATED RINGERS INFUSION - SIMPLE MED
1.0000 m[IU]/min | INTRAVENOUS | Status: DC
  Filled 2017-01-19: qty 1000

## 2017-01-19 MED ORDER — LIDOCAINE HCL (PF) 1 % IJ SOLN: 30.0000 mL | INTRAMUSCULAR | Status: DC | PRN

## 2017-01-19 MED ORDER — PENICILLIN G POTASSIUM 5000000 UNITS IJ SOLR
5.0000 10*6.[IU] | Freq: Once | INTRAVENOUS | Status: DC
  Filled 2017-01-19: qty 5

## 2017-01-19 MED ORDER — BUTORPHANOL TARTRATE 2 MG/ML IJ SOLN: 1.0000 mg | INTRAMUSCULAR | Status: DC | PRN

## 2017-01-19 MED ORDER — HYDRALAZINE HCL 20 MG/ML IJ SOLN: 10.0000 mg | Freq: Once | INTRAMUSCULAR | Status: DC | PRN

## 2017-01-19 NOTE — Consult Note (Signed)
I spoke with Dr. Bonney Aid and then with the patient and her husband. Because of her medical conditions and her current laboratory results she may not be a candidate to receive an epidural or a spinal at this facility from our team at this time. I feel that she would benefit from being transferred to a higher level of care obstetric facility.  She may be a candidate for an epidural or a spinal at a higher level of care obstetric facility. They all voiced understanding.  Angela Cox MD

## 2017-01-19 NOTE — Anesthesia Procedure Notes (Signed)
Epidural Patient location during procedure: OB  Staffing Anesthesiologist: Phillips Grout Performed: anesthesiologist   Preanesthetic Checklist Completed: patient identified, site marked, surgical consent, pre-op evaluation, timeout performed, IV checked, risks and benefits discussed and monitors and equipment checked  Epidural Patient position: sitting Prep: DuraPrep Patient monitoring: heart rate, continuous pulse ox and blood pressure Approach: right paramedian Location: L3-L4 Injection technique: LOR saline  Needle:  Needle type: Tuohy  Needle gauge: 17 G Needle length: 9 cm and 9 Needle insertion depth: 5 cm Catheter type: closed end flexible Catheter size: 20 Guage Catheter at skin depth: 9 cm Test dose: negative  Assessment Events: blood not aspirated, injection not painful, no injection resistance, negative IV test and no paresthesia  Additional Notes Patient identified. Risks/Benefits/Options discussed with patient including but not limited to bleeding, infection, nerve damage, paralysis, failed block, incomplete pain control, headache, blood pressure changes, nausea, vomiting, reactions to medication both or allergic, itching and postpartum back pain. Confirmed with bedside nurse the patient's most recent platelet count. Confirmed with patient that they are not currently taking any anticoagulation, have any bleeding history or any family history of bleeding disorders. Patient expressed understanding and wished to proceed. All questions were answered. Sterile technique was used throughout the entire procedure. Please see nursing notes for vital signs. Test dose was given through epidural needle and negative. Single pass to LOR.  Oozing of blood from insertion site noted. Dressing applied.

## 2017-01-19 NOTE — Progress Notes (Deleted)
  [facility] IN[facility]CTION ASSESSMENT Michelle Schultz 1991-02-04 Medical record #: [id number] Phone #:   Home Phone [phone]  Mobile [phone]    Prenatal Provider:{Blank single:19197::""[facility]","[facility]","[facility]","[facility]","[facility]","[facility]","***"} Delivering Group:{Blank single:19197::"[facility]","[facility]","[facility]"} Proposed admission date/time:*** Method of induction:{Blank single:19197::"Cytotec","Pitocin"}  Weight: {Blank single:19197::"There were no vitals filed for this visit.","***"} BMI {Blank single:19197::"There is no height or weight on file to calculate BMI.,"***"} HIV {Blank single:19197::"Positive","Negative"} HSV {Blank single:19197::"Positive","Negative"} EDC {Blank single:19197::"Estimated Date of Delivery: 01/16/17","***"}based on:{Blank single:19197::"LMP","US at *** wks","+HCG for 36 weeks","***"}  Gestational age on admission: *** Gravidity/parity:{Blank single:19197::"G1P0","***"}  Cervix Score   0 Position Posterior Midposition Anterior   Consistency Firm Medium Soft   Effacement (%) 0-30 40-50 60-70 >80  Dilation (cm) Closed 1-2 3-4 >5  Baby's station -3 -2 -1 +1, +2   Bishop Score:{NUMBERS;0-15 BY 1:408015}   Medical induction of labor  select indication(s) below Elective induction ?39 weeks multiparous patient ?39 weeks primiparous patient with Bishop score ?7 ?40 weeks primiparous patient   Medical Indications Adapted from ACOG Committee Opinion #560, "Medically Indicated Late Preterm and Early Term Deliveries," 2013.  PLACENTAL / UTERINE ISSUES FETAL ISSUES MATERNAL ISSUES  ? Placenta previa (36.0-37.6) ? Isoimmunization (37.0-38.6) ? Preeclampsia without severe features or gestational HTN (37.0)  ? Suspected accreta (34.0-35.6) ? Growth Restriction Mason Jim) ? Preeclampsia with severe features (34.0)  ? Prior classical CD, uterine window, rupture (36.0-37.6) ? Isolated  (38.0-39.6) ? Chronic HTN (38.0-39.6)  ? Prior myomectomy (37.0-38.6) ? Concurrent findings (34.0-37.6) ? Cholestasis (37.0)  ? Umbilical vein varix (37.0) ? Growth Restriction (Twins) ? Diabetes  ? Placental abruption (chronic) ? Di-Di Isolated (36.0-37.6) ? Pregestational, controlled (39.0)  OBSTETRIC ISSUES ? Di-Di concurrent findings (32.0-34.6) ? Pregestational, uncontrolled (37.0-39.0)  ? Postdates ? (41 weeks) ? Mo-Di isolated (32.0-34.6) ? Pregestational, vascular compromise (37.0- 39.0)  ? PPROM (34.0) ? Multiple Gestation ? Gestational, diet controlled (40.0)  ? Hx of IUFD (39.0 weeks) ? Di-Di (38.0-38.6) ? Gestational, med controlled (39.0)  ? Polyhydramnios, mild/moderate; SDV 8-16 or AFI 25-35 (39.0) ? Mo-Di (36.0-37.6) ? Gestational, uncontrolled (38.0-39.0)  ? Oligohydramnios (36.0-37.6); MVP <2 cm  For indications not listed above, delivery recommendations from maternal-fetal medicine consultant occurred on: Date:*** with Dr. Marland Kitchen for indication of:***  Provider Signature: Oswaldo Conroy Scheduled by:*** Date:01/19/2017 8:30 AM   Call [phone] to finalize the induction date/time  [id number] (07/17)

## 2017-01-19 NOTE — H&P (Signed)
Obstetrics Admission History & Physical  01/19/2017 - 8:15 PM Primary OBGYN: [facility]  Chief Complaint: transfer of care for HELLP  History of Present Illness  26 y.o. G1 @ [redacted]w[redacted]d, with the above CC. Pregnancy complicated by: GBS pos, BMI 30s, gestational thrombocytopenia (136k) at 28wks, normal 3hr  Seen for regular PNV today and noted to have elevated BPs so sent to [facility] for evaluation. There, she ruled in for gestational hypertension and had plts at 43. PC ratio 270 with normal CMP. Anesthesia there felt more comfortable with pt transfer to tertiary care center.   Ms. Michelle Schultz states that she has no s/s of pre-eclampsia. She does feels some contractions  Review of Systems:  as noted in the History of Present Illness.  PMHx:  Past Medical History:  Diagnosis Date  . Eczema    PSHx:  Past Surgical History:  Procedure Laterality Date  . NO PAST SURGERIES     Medications:  No prescriptions prior to admission.     Allergies: has No Known Allergies. OBHx:  OB History  Gravida Para Term Preterm AB Living  1            SAB TAB Ectopic Multiple Live Births               # Outcome Date GA Lbr Len/2nd Weight Sex Delivery Anes PTL Lv  1 Current                         FHx:  Family History  Problem Relation Age of Onset  . Hypertension Mother   . Heart attack Maternal Grandfather   . Heart attack Paternal Grandfather   . Breast cancer Neg Hx   . Ovarian cancer Neg Hx   . Diabetes Neg Hx    Soc Hx:  Social History   Social History  . Marital status: Married    Spouse name: Deniece Portela  . Number of children: N/A  . Years of education: 59   Occupational History  . Real estate    Social History Main Topics  . Smoking status: Former Smoker    Years: 2.00  . Smokeless tobacco: Never Used     Comment: 1 pack/week-quit 2017  . Alcohol use Yes     Comment: prior to pregnancy  . Drug use: No  . Sexual activity: Yes    Partners: Male    Birth control/  protection: None   Other Topics Concern  . Not on file   Social History Narrative  . No narrative on file    Objective    Current Vital Signs 24h Vital Sign Ranges  T 98.1 F (36.7 C) Temp  Avg: 98 F (36.7 C)  Min: 97.8 F (36.6 C)  Max: 98.1 F (36.7 C)  BP (!) 149/102 BP  Min: 130/96  Max: 156/120  HR 89 Pulse  Avg: 91.5  Min: 80  Max: 102  RR 16 Resp  Avg: 17.5  Min: 16  Max: 18  SaO2   Not Delivered SpO2  Avg: 94.4 %  Min: 92 %  Max: 96 %       24 Hour I/O Current Shift I/O  Time Ins Outs No intake/output data recorded. No intake/output data recorded.   Vitals:   01/19/17 1946 01/19/17 1954 01/19/17 2004 01/19/17 2005  BP: (!) 155/101  (!) 149/102 (!) 149/102  Pulse: 90  89 89  Resp: 16     Temp:  98.1 F (  36.7 C)      Temp:  [97.8 F (36.6 C)-98.1 F (36.7 C)] 98.1 F (36.7 C) (09/20 1954) Pulse Rate:  [80-102] 89 (09/20 2005) Resp:  [16-18] 16 (09/20 1946) BP: (130-156)/(94-120) 149/102 (09/20 2005) SpO2:  [92 %-96 %] 95 % (09/20 1850) Weight:  [189 lb (85.7 kg)] 189 lb (85.7 kg) (09/20 1143) No intake/output data recorded. No intake/output data recorded. No intake or output data in the 24 hours ending 01/19/17 2017   Current Vital Signs 24h Vital Sign Ranges  T 98.1 F (36.7 C) Temp  Avg: 98 F (36.7 C)  Min: 97.8 F (36.6 C)  Max: 98.1 F (36.7 C)  BP (!) 149/102 BP  Min: 130/96  Max: 156/120  HR 89 Pulse  Avg: 91.5  Min: 80  Max: 102  RR 16 Resp  Avg: 17.5  Min: 16  Max: 18  SaO2   Not Delivered SpO2  Avg: 94.4 %  Min: 92 %  Max: 96 %       24 Hour I/O Current Shift I/O  Time Ins Outs No intake/output data recorded. No intake/output data recorded.   Patient Vitals for the past 24 hrs:  BP Temp Pulse Resp  01/19/17 2005 (!) 149/102 - 89 -  01/19/17 2004 (!) 149/102 - 89 -  01/19/17 1954 - 98.1 F (36.7 C) - -  01/19/17 1946 (!) 155/101 - 90 16   EFM: 135 baseline, +accels, no decels, mod var  Toco: q5-53m  General: Well nourished,  well developed female in no acute distress.  Skin:  Warm and dry.  Cardiovascular: S1, S2 normal, no murmur, rub or gallop, regular rate and rhythm Respiratory:  Clear to auscultation bilateral. Normal respiratory effort Abdomen: gravid, nttp Neuro/Psych:  Normal mood and affect. 2+brachial GU: foley in place with clear UOP and approx UOP in bag  SVE: FT/long/high-->pt stretched to 1cm. Pelvis feels adequate Leopolds/EFW: cephalic, 3800gm  Labs  A pos/Rub non imm/rpr pending/hiv neg/hepB neg/pap neg 2018/GBS pos/1hr in the 170s with normal 3hr GTT  Recent Labs Lab 01/19/17 1105  WBC 8.3  HGB 12.3  HCT 35.6  PLT 93*    Recent Labs Lab 01/19/17 1105  NA 136  K 3.7  CL 109  CO2 17*  BUN 9  CREATININE 0.73  CALCIUM 9.0  PROT 6.7  BILITOT 0.5  ALKPHOS 201*  ALT 16  AST 22  GLUCOSE 88   PC 270 @ 11am  Radiology none  Assessment & Plan   26 y.o. 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 Will get repeat labs and get serial CBCs and CMPs to ensure stability. Will also get fibrinogen, PTT, PT/INR. Continue Mg at 2/hr, follow UOP and will have anesthesia come see after CBC is back to see about their thresholds for epidural placement, recommendations for "dry" epidural now if plts are okay, etc.  *IOL: cytotec once labs are back *GBS pos: PCN ordered for when pt is in active labor/ROM *Analgesia: no current needs *PPx: SCDs on  Cornelia Copa. MD Attending Center for [facility])

## 2017-01-19 NOTE — Anesthesia Preprocedure Evaluation (Addendum)
Anesthesia Evaluation  Patient identified by MRN, date of birth, ID band Patient awake    Reviewed: Allergy & Precautions, H&P , NPO status , Patient's Chart, lab work & pertinent test results  History of Anesthesia Complications Negative for: history of anesthetic complications  Airway Mallampati: II  TM Distance: >3 FB Neck ROM: full    Dental no notable dental hx. (+) Teeth Intact   Pulmonary neg pulmonary ROS, former smoker,    Pulmonary exam normal breath sounds clear to auscultation       Cardiovascular hypertension, negative cardio ROS Normal cardiovascular exam Rhythm:regular Rate:Normal     Neuro/Psych negative neurological ROS  negative psych ROS   GI/Hepatic negative GI ROS, Neg liver ROS,   Endo/Other  negative endocrine ROS  Renal/GU negative Renal ROS  negative genitourinary   Musculoskeletal   Abdominal   Peds  Hematology negative hematology ROS (+)   Anesthesia Other Findings Will place dry cath for HELLP syndrome  Reproductive/Obstetrics (+) Pregnancy Severe pre eclamsia HELLP syndrome                            Anesthesia Physical Anesthesia Plan  ASA: III and emergent  Anesthesia Plan: Epidural   Post-op Pain Management:    Induction:   PONV Risk Score and Plan: 4 or greater and Ondansetron, Dexamethasone, Midazolam, Scopolamine patch - Pre-op and Propofol infusion  Airway Management Planned: Natural Airway and Nasal Cannula  Additional Equipment:   Intra-op Plan:   Post-operative Plan:   Informed Consent: I have reviewed the patients History and Physical, chart, labs and discussed the procedure including the risks, benefits and alternatives for the proposed anesthesia with the patient or authorized representative who has indicated his/her understanding and acceptance.   Dental advisory given  Plan Discussed with: CRNA, Anesthesiologist and  Surgeon  Anesthesia Plan Comments: (Patient for C/section for arrest of descent/failed vacuum. Will use epidural for C/Section. M. Malen Gauze, MD)       Anesthesia Quick Evaluation

## 2017-01-19 NOTE — Progress Notes (Signed)
C/O No concerns. Desires cx ck.

## 2017-01-19 NOTE — OB Triage Note (Signed)
Michelle Schultz here with elevated BP at office, denies HA, visual changes, swelling, pain, nausea, bleeding. Reports positive fetal movement.

## 2017-01-19 NOTE — Anesthesia Pain Management Evaluation Note (Signed)
  [facility] Pain Management Visit Note  Patient: Michelle Schultz, 26 y.o., female  "Hello I am a member of the anesthesia team at [facility] We have an anesthesia team available at all times to provide care throughout the hospital, including epidural management and anesthesia for C-section. I don't know your plan for the delivery whether it a natural birth, water birth, IV sedation, nitrous supplementation, doula or epidural, but we want to meet your pain goals."   1.Was your pain managed to your expectations on prior hospitalizations?   No prior hospitalizations  2.What is your expectation for pain management during this hospitalization?     Epidural and IV pain meds  3.How can we help you reach that goal? If the pt's platelets are adequate, she would like an epidural.  Record the patient's initial score and the patient's pain goal.   Pain: 0  Pain Goal: 2 [facility] wants you to be able to say your pain was always managed very well.  Hall Birchard 01/19/2017

## 2017-01-19 NOTE — Progress Notes (Signed)
Foley inserted and inflated w/60cc H20.  If pt doesn't have significant response to 1st cyttotec, may d/c foley and SCD's as pt could potentially have an extended IOL and we are able to measure output  Patient Vitals for the past 24 hrs:  BP Temp Pulse Resp SpO2 Height Weight  01/19/17 2306 (!) 147/103 - 86 18 98 % - -  01/19/17 2301 (!) 153/101 - 90 16 98 % - -  01/19/17 2256 (!) 150/100 - 86 16 98 % - -  01/19/17 2254 (!) 156/104 - 89 - - - -  01/19/17 2246 (!) 149/99 - 88 16 97 % - -  01/19/17 2245 (!) 143/96 - 86 - - - -  01/19/17 2242 (!) 147/93 - 88 16 - - -  01/19/17 2240 - - - - 98 % - -  01/19/17 2239 - - - - 98 % - -  01/19/17 2201 (!) 150/108 - 91 18 - - -  01/19/17 2101 (!) 147/100 - 88 16 -  (1.575 m) 85.7 kg (189 lb)  01/19/17 2016 (!) 150/97 - 91 16 - - -  01/19/17 2005 (!) 149/102 - 89 - - - -  01/19/17 2004 (!) 149/102 - 89 - - - -  01/19/17 1954 - 98.1 F (36.7 C) - - - - -  01/19/17 1946 (!) 155/101 - 90 16 - - -

## 2017-01-19 NOTE — Progress Notes (Signed)
Report called to Herbert Seta, charge RN at [facility], pt to be transferred via [facility] to bed 73

## 2017-01-19 NOTE — Progress Notes (Signed)
Routine Prenatal Care Visit  Subjective  Michelle Schultz is a 26 y.o. G1P0 at 53w3dbeing seen today for ongoing prenatal care.  She is currently monitored for the following issues for this low-risk pregnancy and has Supervision of low-risk first pregnancy and Rubella non-immune status, antepartum on her problem list.  ----------------------------------------------------------------------------------- Patient reports pelvic pressure. Denies headaches, visual changes, epigastric pain, worsening edema. Contractions: Not present. Vag. Bleeding: None.  Movement: Present. Denies leaking of fluid.  ----------------------------------------------------------------------------------- The following portions of the patient's history were reviewed and updated as appropriate: allergies, current medications, past family history, past medical history, past social history, past surgical history and problem list. Problem list updated.   Objective  Blood pressure (!) 156/120, weight 189 lb (85.7 kg), last menstrual period 04/11/2016. Pregravid weight 140 lb (63.5 kg) Total Weight Gain 49 lb (22.2 kg) Urinalysis: Urine Protein: Trace Urine Glucose: Negative  Fetal Status: Fetal Heart Rate (bpm): 148   Movement: Present     General:  Alert, oriented and cooperative. Patient is in no acute distress.  Skin: Skin is warm and dry. No rash noted.   Cardiovascular: Normal heart rate noted  Respiratory: Normal respiratory effort, no problems with respiration noted  Abdomen: Soft, gravid, appropriate for gestational age. Pain/Pressure: Present     Pelvic:  Cervical exam performed Dilation: 2.5 Effacement (%): 60 Station: -2  Extremities: Normal range of motion.     Mental Status: Normal mood and affect. Normal behavior. Normal judgment and thought content.     Assessment   26y.o. G1P0 at 460w3dy  01/16/2017, by Last Menstrual Period presenting for routine prenatal visit who has elevated blood pressures today  with no other symptoms of pre-eclampsia.  Plan   pregnancy #1 Problems (from 04/17/16 to present)    Problem Noted Resolved   Supervision of low-risk first pregnancy 09/14/2016 by GuDalia HeadingCNM No   Overview Addendum 01/06/2017 11:12 AM by ScRexene AgentCNManchesterrenatal Labs  Dating LMP=8wk ultrasound Blood type: A/Positive/-- (02/06 0000)   Genetic Screen 1 Screen: neg   AFP:     Quad:     NIPS: Antibody:Negative (02/06 0000)  Anatomic [place]/18/2018 WNL, posterior placenta Rubella: Nonimmune (02/06 0000)  GTT Third trimester: 172, passed 3h gtt all values normal RPR: Non Reactive (06/28 0940)   Flu vaccine  HBsAg: Negative (02/06 0000)   TDaP vaccine  12/09/16                            Rhogam: N/A HIV: Non-reactive (02/06 0000)   Baby Food Breast                                      [id number])(For PCN allergy, check sensitivities)  Contraception Pill/depo Pap:  Circumcision    Pediatrician    Support Person              Rubella non-immune status, antepartum 09/14/2016 by GuDalia HeadingCNM No   Overview Signed 09/14/2016  8:15 PM by GuDalia HeadingCNM    MMR postpartum         Patient sent to L&D for serial BP readings of 140/100 and 156/120.  Induction set for 9/24 at 41 weeks, will follow-up to see if it is still needed or if she is admitted for delivery  today.  Avel Sensor, CNM 01/19/2017  10:13 AM

## 2017-01-19 NOTE — H&P (Signed)
OB History & Physical   History of Present Illness:  Chief Complaint:  "The office sent me because my blood pressure was high!" HPI:  Michelle Schultz is a 26 y.o. G1P0 female with EDC=01/16/2017 at [redacted]w[redacted]d dated by LMP=8week ultrasound.  Her pregnancy has been uncomplicated until she presented in office today with an elevated blood pressure of  156/120, 140/100.  She presents to L&D for evaluation of elevated blood pressure. She denies headaches, visual changes, RUQ abdominal pain, CP, or SOB. Baby has been active. Has been contracting for over a week, but contractions not painful. No vaginal bleeding or leakage of water.   Prenatal care site: Prenatal care begun in [place] and transferred to [facility].    Clinic [facility] Prenatal Labs  Dating LMP=8wk ultrasound Blood type: A/Positive/-- (02/06 0000)   Genetic Screen 1 Screen: neg   AFP:     Quad:     NIPS: Antibody:Negative (02/06 0000)  Anatomic [place] 08/17/2016 WNL, posterior placenta Rubella: Nonimmune (02/06 0000)  GTT Third trimester: 172, passed 3h gtt all values normal RPR: Non Reactive (06/28 0940)   Flu vaccine  HBsAg: Negative (02/06 0000)   TDaP vaccine  12/09/16                            Rhogam: N/A HIV: Non-reactive (02/06 0000)   Baby Food Breast                                      [id number] (08/24 1515)(For PCN allergy, check sensitivities)  Contraception Pill/depo Pap: NIL 06/2016  Circumcision Baby girl   Pediatrician    Support Person            Maternal Medical History:   Past Medical History:  Diagnosis Date  . Eczema     Past Surgical History:  Procedure Laterality Date  . NO PAST SURGERIES      No Known Allergies  Prior to Admission medications   Medication Sig Start Date End Date Taking? Authorizing Provider  Prenatal Vit-Fe Fumarate-FA (MULTIVITAMIN-PRENATAL) 27-0.8 MG TABS tablet Take 1 tablet by mouth daily at 12 noon.    [provider]          Social History: She   reports that she has quit smoking. She quit after 2.00 years of use. She has never used smokeless tobacco. She reports that she drinks alcohol. She reports that she does not use drugs.  Family History: family history includes Heart attack in her maternal grandfather and paternal grandfather; Hypertension in her mother.   Review of Systems: Negative x 10 systems reviewed except as noted in the HPI.      Physical Exam:  Vital Signs: BP (!) 146/100 (BP Location: Left Arm)   Pulse (!) 102   Temp 98 F (36.7 C) (Oral)   Resp 18   Ht  (1.575 m)   Wt 85.7 kg (189 lb)   LMP 04/11/2016 (LMP Unknown)   SpO2 94%   BMI 34.57 kg/m   Range 130-154/96-105  General: WF in no acute distress.  HEENT: normocephalic, atraumatic Heart: regular rate & rhythm.  No murmurs/rubs/gallops Lungs: clear to auscultation bilaterally Abdomen: soft, gravid, non-tender;  EFW: 7 1/2# Pelvic:   External: Normal external female genitalia  Cervix: 0.5/ 60%/-1 to -2  Extremities: non-tender, symmetric, tracepedal edema bilaterally.  DTRs: +3  Neurologic:  Alert & oriented x 3.    Baseline FHR: 145 baseline with accelerations to 170s, moderate variability Toco: mild contractions every 2-8 min apart  Results for orders placed or performed during the hospital encounter of 01/19/17 (from the past 24 hour(s))  Type and screen [facility]     Status: None   Collection Time: 01/19/17 11:05 AM  Result Value Ref Range   ABO/RH(D) A POS    Antibody Screen NEG    Sample Expiration 01/22/2017   CBC with Differential/Platelet     Status: Abnormal   Collection Time: 01/19/17 11:05 AM  Result Value Ref Range   WBC 8.3 3.6 - 11.0 K/uL   RBC 4.15 3.80 - 5.20 MIL/uL   Hemoglobin 12.3 12.0 - 16.0 g/dL   HCT 16.1 09.6 - 04.5 %   MCV 85.9 80.0 - 100.0 fL   MCH 29.6 26.0 - 34.0 pg   MCHC 34.4 32.0 - 36.0 g/dL   RDW 40.9 81.1 - 91.4 %   Platelets 93 (L) 150 - 440 K/uL   Neutrophils Relative % 75 %    Neutro Abs 6.2 1.4 - 6.5 K/uL   Lymphocytes Relative 18 %   Lymphs Abs 1.5 1.0 - 3.6 K/uL   Monocytes Relative 6 %   Monocytes Absolute 0.5 0.2 - 0.9 K/uL   Eosinophils Relative 1 %   Eosinophils Absolute 0.1 0 - 0.7 K/uL   Basophils Relative 0 %   Basophils Absolute 0.0 0 - 0.1 K/uL  Comprehensive metabolic panel     Status: Abnormal   Collection Time: 01/19/17 11:05 AM  Result Value Ref Range   Sodium 136 135 - 145 mmol/L   Potassium 3.7 3.5 - 5.1 mmol/L   Chloride 109 101 - 111 mmol/L   CO2 17 (L) 22 - 32 mmol/L   Glucose, Bld 88 65 - 99 mg/dL   BUN 9 6 - 20 mg/dL   Creatinine, Ser 7.82 0.44 - 1.00 mg/dL   Calcium 9.0 8.9 - 95.6 mg/dL   Total Protein 6.7 6.5 - 8.1 g/dL   Albumin 3.2 (L) 3.5 - 5.0 g/dL   AST 22 15 - 41 U/L   ALT 16 14 - 54 U/L   Alkaline Phosphatase 201 (H) 38 - 126 U/L   Total Bilirubin 0.5 0.3 - 1.2 mg/dL   GFR calc non Af Amer >60 >60 mL/min   GFR calc Af Amer >60 >60 mL/min   Anion gap 10 5 - 15  Protein / creatinine ratio, urine     Status: Abnormal   Collection Time: 01/19/17 11:16 AM  Result Value Ref Range   Creatinine, Urine 48 mg/dL   Total Protein, Urine 13 mg/dL   Protein Creatinine Ratio 0.27 (H) 0.00 - 0.15 mg/mg[Cre]   Assessment:  Michelle Schultz is a 26 y.o. G1P0 female at [redacted]w[redacted]d with severe preeclampsia based on platelets<100K. FWB: Cat 1 tracing    Plan:  1. Admit to Labor & Delivery - discussed diagnosis with patient and treatment of same.  Explained that this is an indication to move toward delivery with induction of labor.  Explained need to start magnesium sulfate for seizure prophyllaxis. Questions  Answered. Also advised would probably also not be a candidate for epidural  anesthesia-can use analgesics IV and N2O2 2.  Start magnesium and Pitocin after lunch. 3.  Regular diet for lunch then clear liquids 4. GBS positive-PCN for PPX.   5. Consents obtained. 6. Notified Dr Bonney Aid of admission and  discussed POM with him.    Farrel Conners  01/19/2017 1:58 PM

## 2017-01-19 NOTE — Discharge Summary (Signed)
Obstetric Discharge Summary Reason for Admission: HELLP syndrome (BP and platetlet count) Prenatal Procedures: NST and magnesium sulfate IV Intrapartum Procedures: [facility]  Hemoglobin  Date Value Ref Range Status  01/19/2017 12.3 12.0 - 16.0 g/dL Final  16/01/9603 54.0 (L) 11.1 - 15.9 g/dL Final  98/03/9146 82.9 g/dL Final   HCT  Date Value Ref Range Status  01/19/2017 35.6 35.0 - 47.0 % Final  06/07/2016 37 % Final   Hematocrit  Date Value Ref Range Status  10/27/2016 32.5 (L) 34.0 - 46.6 % Final   26 year old G1P0 at [redacted]w[redacted]d presenting from clinic today for evaluation of elevated blood pressures.  The patient is asymptomatic with no headaches, vision changes, RUQ or epigastric pain.  Her pregnancy thus far has been uncomplicated other than elevated 28 week 1hr with normal 3-hr.  Weight gain this pregnancy  49 lb (22.2 kg) with only 3lbs in the last week no significant edema.  Evaluation on L&D confirmed mild range blood pressures, normal labs other than thrombocytopenia.  Given the diagnosis of HELLP and the inability to readily obtain platelets anesthesia recommended transfer.  Patient was started on PCN for GBS prophylaxis and started on magnesium sulfate.  Patient preference is [facility].  Physical Exam:  General: alert, appears stated age and no distress Uterine Fundus: gravid, non-tender DVT Evaluation: No evidence of DVT seen on physical exam.  Discharge Diagnoses: HELLP syndrome  Discharge Information: Date: 01/19/2017 Diet: NPO Allergies as of 01/19/2017   No Known Allergies     Medication List    STOP taking these medications   multivitamin-prenatal 27-0.8 MG Tabs tablet       Condition: stable [facility]   [place] 01/19/2017, 5:51 PM

## 2017-01-19 NOTE — Progress Notes (Signed)
Chaplain responded to an OR for pt who wanted to complete Advanced Directives. CH met with pt and family at bedside. CH educated pt on how to complete AD. Pt states she needs time to review material and will contact CH when ready to complete AD. CH to follow up with pt as needed.   01/19/17 1500  Clinical Encounter Type  Visited With Patient and family together  Visit Type Initial;Other (Comment)  Referral From Nurse  Consult/Referral To Chaplain  Spiritual Encounters  Spiritual Needs Literature;Other (Comment)   

## 2017-01-20 LAB — CBC
HCT: 34 % — ABNORMAL LOW (ref 36.0–46.0)
HCT: 33.9 % — ABNORMAL LOW (ref 36.0–46.0)
HEMATOCRIT: 34.5 % — AB (ref 36.0–46.0)
HEMOGLOBIN: 11.6 g/dL — AB (ref 12.0–15.0)
HEMOGLOBIN: 11.5 g/dL — AB (ref 12.0–15.0)
Hemoglobin: 11.8 g/dL — ABNORMAL LOW (ref 12.0–15.0)
MCH: 29.6 pg (ref 26.0–34.0)
MCH: 29.4 pg (ref 26.0–34.0)
MCH: 29.5 pg (ref 26.0–34.0)
MCHC: 34.1 g/dL (ref 30.0–36.0)
MCHC: 34.2 g/dL (ref 30.0–36.0)
MCHC: 33.9 g/dL (ref 30.0–36.0)
MCV: 86.7 fL (ref 78.0–100.0)
MCV: 85.8 fL (ref 78.0–100.0)
MCV: 86.9 fL (ref 78.0–100.0)
Platelets: 104 10*3/uL — ABNORMAL LOW (ref 150–400)
Platelets: 106 10*3/uL — ABNORMAL LOW (ref 150–400)
Platelets: 108 10*3/uL — ABNORMAL LOW (ref 150–400)
RBC: 3.92 MIL/uL (ref 3.87–5.11)
RBC: 4.02 MIL/uL (ref 3.87–5.11)
RBC: 3.9 MIL/uL (ref 3.87–5.11)
RDW: 13.8 % (ref 11.5–15.5)
RDW: 13.7 % (ref 11.5–15.5)
RDW: 13.9 % (ref 11.5–15.5)
WBC: 10 10*3/uL (ref 4.0–10.5)
WBC: 10.3 10*3/uL (ref 4.0–10.5)
WBC: 10.9 10*3/uL — ABNORMAL HIGH (ref 4.0–10.5)

## 2017-01-20 LAB — COMPREHENSIVE METABOLIC PANEL
ALBUMIN: 2.9 g/dL — AB (ref 3.5–5.0)
ALK PHOS: 223 U/L — AB (ref 38–126)
ALT: 14 U/L (ref 14–54)
ALT: 15 U/L (ref 14–54)
ALT: 14 U/L (ref 14–54)
ANION GAP: 12 (ref 5–15)
ANION GAP: 9 (ref 5–15)
AST: 21 U/L (ref 15–41)
AST: 23 U/L (ref 15–41)
AST: 22 U/L (ref 15–41)
Albumin: 3 g/dL — ABNORMAL LOW (ref 3.5–5.0)
Albumin: 2.6 g/dL — ABNORMAL LOW (ref 3.5–5.0)
Alkaline Phosphatase: 220 U/L — ABNORMAL HIGH (ref 38–126)
Alkaline Phosphatase: 231 U/L — ABNORMAL HIGH (ref 38–126)
Anion gap: 8 (ref 5–15)
BILIRUBIN TOTAL: 0.6 mg/dL (ref 0.3–1.2)
BUN: 8 mg/dL (ref 6–20)
BUN: 7 mg/dL (ref 6–20)
BUN: 7 mg/dL (ref 6–20)
CALCIUM: 7.8 mg/dL — AB (ref 8.9–10.3)
CHLORIDE: 106 mmol/L (ref 101–111)
CO2: 21 mmol/L — ABNORMAL LOW (ref 22–32)
CO2: 19 mmol/L — AB (ref 22–32)
CO2: 22 mmol/L (ref 22–32)
CREATININE: 0.76 mg/dL (ref 0.44–1.00)
Calcium: 7.6 mg/dL — ABNORMAL LOW (ref 8.9–10.3)
Calcium: 7.4 mg/dL — ABNORMAL LOW (ref 8.9–10.3)
Chloride: 108 mmol/L (ref 101–111)
Chloride: 104 mmol/L (ref 101–111)
Creatinine, Ser: 0.7 mg/dL (ref 0.44–1.00)
Creatinine, Ser: 0.79 mg/dL (ref 0.44–1.00)
GFR calc Af Amer: >60 mL/min (ref >60)
GFR calc Af Amer: >60 mL/min (ref >60)
GFR calc non Af Amer: >60 mL/min (ref >60)
GFR calc non Af Amer: >60 mL/min (ref >60)
GLUCOSE: 100 mg/dL — AB (ref 65–99)
Glucose, Bld: 116 mg/dL — ABNORMAL HIGH (ref 65–99)
Glucose, Bld: 112 mg/dL — ABNORMAL HIGH (ref 65–99)
POTASSIUM: 4.4 mmol/L (ref 3.5–5.1)
Potassium: 4.1 mmol/L (ref 3.5–5.1)
Potassium: 4.4 mmol/L (ref 3.5–5.1)
SODIUM: 137 mmol/L (ref 135–145)
Sodium: 137 mmol/L (ref 135–145)
Sodium: 135 mmol/L (ref 135–145)
TOTAL PROTEIN: 6.5 g/dL (ref 6.5–8.1)
Total Bilirubin: 0.5 mg/dL (ref 0.3–1.2)
Total Bilirubin: 0.6 mg/dL (ref 0.3–1.2)
Total Protein: 5.9 g/dL — ABNORMAL LOW (ref 6.5–8.1)
Total Protein: 6.3 g/dL — ABNORMAL LOW (ref 6.5–8.1)

## 2017-01-20 LAB — RPR
RPR Ser Ql: NONREACTIVE
RPR: NONREACTIVE

## 2017-01-20 LAB — MAGNESIUM: MAGNESIUM: 5.9 mg/dL — AB (ref 1.7–2.4)

## 2017-01-20 MED ORDER — SODIUM BICARBONATE 8.4 % IV SOLN
INTRAVENOUS | Status: DC | PRN
  Administered 2017-01-20: 3 mL via EPIDURAL
  Administered 2017-01-21: 4 mL via EPIDURAL
  Administered 2017-01-21: 5 mL via EPIDURAL
  Administered 2017-01-21: 4 mL via EPIDURAL

## 2017-01-20 MED ORDER — OXYTOCIN 40 UNITS IN LACTATED RINGERS INFUSION - SIMPLE MED
1.0000 m[IU]/min | INTRAVENOUS | Status: DC
  Administered 2017-01-20: 16 m[IU]/min via INTRAVENOUS
  Administered 2017-01-20: 6 m[IU]/min via INTRAVENOUS
  Administered 2017-01-20: 2 m[IU]/min via INTRAVENOUS
  Administered 2017-01-21: 20 m[IU]/min via INTRAVENOUS
  Filled 2017-01-20 (×2): qty 1000

## 2017-01-20 MED ORDER — TERBUTALINE SULFATE 1 MG/ML IJ SOLN: 0.2500 mg | Freq: Once | INTRAMUSCULAR | Status: DC | PRN

## 2017-01-20 MED ORDER — ZOLPIDEM TARTRATE 5 MG PO TABS
5.0000 mg | ORAL_TABLET | Freq: Every evening | ORAL | Status: DC | PRN
  Administered 2017-01-20: 5 mg via ORAL
  Filled 2017-01-20: qty 1

## 2017-01-20 NOTE — Progress Notes (Signed)
Patient ID: Michelle Schultz, female   DOB: [birth year], 26 y.o.   MRN: [id number]  Feeling back pain despite epidural; IUPC has been replaced x 2  BP 132/91, other VSS FHR 150s, min LTV, 10x10accels, no decels Ctx q 2-3 mins w/ 45mu/min Pit Cx 5+/90/-2  Mag level: 5.9 CBC    Component Value Date/Time   WBC 10.9 (H) 01/20/2017 2031   RBC 3.90 01/20/2017 2031   HGB 11.5 (L) 01/20/2017 2031   HGB 11.0 (L) 10/27/2016 0940   HGB 12.8 06/07/2016   HCT 33.9 (L) 01/20/2017 2031   HCT 32.5 (L) 10/27/2016 0940   HCT 37 06/07/2016   PLT 108 (L) 01/20/2017 2031   PLT 136 (L) 10/27/2016 0940   PLT 198 06/07/2016   MCV 86.9 01/20/2017 2031   MCV 87 10/27/2016 0940   MCH 29.5 01/20/2017 2031   MCHC 33.9 01/20/2017 2031   RDW 13.9 01/20/2017 2031   RDW 13.5 10/27/2016 0940   LYMPHSABS 1.5 01/19/2017 1105   LYMPHSABS 1.6 10/27/2016 0940   MONOABS 0.5 01/19/2017 1105   EOSABS 0.1 01/19/2017 1105   EOSABS 0.1 10/27/2016 0940   BASOSABS 0.0 01/19/2017 1105   BASOSABS 0.0 10/27/2016 0940   CMP     Component Value Date/Time   NA 135 01/20/2017 2031   K 4.4 01/20/2017 2031   CL 104 01/20/2017 2031   CO2 22 01/20/2017 2031   GLUCOSE 100 (H) 01/20/2017 2031   BUN 7 01/20/2017 2031   CREATININE 0.79 01/20/2017 2031   CALCIUM 7.4 (L) 01/20/2017 2031   PROT 6.3 (L) 01/20/2017 2031   ALBUMIN 2.9 (L) 01/20/2017 2031   AST 22 01/20/2017 2031   ALT 14 01/20/2017 2031   ALKPHOS 231 (H) 01/20/2017 2031   BILITOT 0.6 01/20/2017 2031   GFRNONAA >60 01/20/2017 2031   GFRAA >60 01/20/2017 2031     IUP@term  HELLP v gHTN w/ ITP Early labor  - IUPC reinserted again due to dampened waveform of previous one; this one is still dampened. Able to see when ctx are happening- will titrate Pit to keep reg ctx  - Anesthesia to eval epidural  Michelle Schultz, [facility]

## 2017-01-20 NOTE — Progress Notes (Signed)
Pt asked to leave her foley in tonight so she can get some sleep.  Epidural dressing saturated with blood shortly after insertion. Pressure 4x4 placed on top of site by RN and anesthesia was notified.  No orders received .  4X4 stable

## 2017-01-20 NOTE — Progress Notes (Signed)
Labor Progress Note  Michelle Schultz is a 26 y.o. G1P0 at [redacted]w[redacted]d  admitted for induction of labor due to ?HELLP vs gHTN and gestational thrombocytopenia.  S: Comfortable. No concerns voiced. No PIH symptoms.  O:  BP 119/88   Pulse 100   Temp 98.2 F (36.8 C) (Oral)   Resp 18   Ht  (1.575 m)   Wt 85.7 kg (189 lb)   LMP 04/11/2016 (LMP Unknown)   SpO2 98%   BMI 34.57 kg/m   Total I/O In: 1805.5 [P.O.:650; I.V.:1055.5; IV Piggyback:100] Out: 1800 [Urine:1800]  FHT:  FHR: 135 bpm, variability: moderate,  accelerations:  Abscent,  decelerations:  Absent UC:   regular, every 2-4 minutes SVE:   Dilation: 5 Effacement (%): 70 Station: -2 Exam by:: S Moyer  Pitocin @ 18 mu/min  Labs: Lab Results  Component Value Date   WBC 10.3 01/20/2017   HGB 11.8 (L) 01/20/2017   HCT 34.5 (L) 01/20/2017   MCV 85.8 01/20/2017   PLT 106 (L) 01/20/2017    Assessment / Plan: 26 y.o. G1P0 [redacted]w[redacted]d  in latent labor Induction of labor due to HELLP,  on pitocin  Labor: On Pitocin continue to increase, AROM ?HELLP: on Mag,had one severe BP but came down without medications, prn labetalol  Fetal Wellbeing:  Category II Pain Control:  Epidural Anticipated MOD:  NSVD  Expectant management   Caryl Ada, DO OB Fellow 01/20/2017, 4:51 PM

## 2017-01-20 NOTE — Progress Notes (Signed)
Vitals:   01/20/17 0501 01/20/17 0601  BP: 134/90 (!) 151/103  Pulse: 82 88  Resp: 16 18  Temp:  97.9 F (36.6 C)  SpO2:     Results for orders placed or performed during the hospital encounter of 01/19/17 (from the past 24 hour(s))  Type and screen [facility] [place]     Status: None   Collection Time: 01/19/17  8:22 PM  Result Value Ref Range   ABO/RH(D) A POS    Antibody Screen NEG    Sample Expiration 01/22/2017   RPR     Status: None   Collection Time: 01/19/17  8:22 PM  Result Value Ref Range   RPR Ser Ql Non Reactive Non Reactive  Comprehensive metabolic panel     Status: Abnormal   Collection Time: 01/19/17  8:22 PM  Result Value Ref Range   Sodium 138 135 - 145 mmol/L   Potassium 3.8 3.5 - 5.1 mmol/L   Chloride 108 101 - 111 mmol/L   CO2 18 (L) 22 - 32 mmol/L   Glucose, Bld 97 65 - 99 mg/dL   BUN 7 6 - 20 mg/dL   Creatinine, Ser 1.61 0.44 - 1.00 mg/dL   Calcium 8.0 (L) 8.9 - 10.3 mg/dL   Total Protein 6.7 6.5 - 8.1 g/dL   Albumin 3.1 (L) 3.5 - 5.0 g/dL   AST 22 15 - 41 U/L   ALT 17 14 - 54 U/L   Alkaline Phosphatase 230 (H) 38 - 126 U/L   Total Bilirubin 0.5 0.3 - 1.2 mg/dL   GFR calc non Af Amer >60 >60 mL/min   GFR calc Af Amer >60 >60 mL/min   Anion gap 12 5 - 15  APTT     Status: Abnormal   Collection Time: 01/19/17  8:22 PM  Result Value Ref Range   aPTT 22 (L) 24 - 36 seconds  Fibrinogen     Status: Abnormal   Collection Time: 01/19/17  8:22 PM  Result Value Ref Range   Fibrinogen 510 (H) 210 - 475 mg/dL  Protime-INR     Status: None   Collection Time: 01/19/17  8:22 PM  Result Value Ref Range   Prothrombin Time 13.4 11.4 - 15.2 seconds   INR 1.02   ABO/Rh     Status: None   Collection Time: 01/19/17  8:22 PM  Result Value Ref Range   ABO/RH(D) A POS   CBC     Status: Abnormal   Collection Time: 01/19/17  8:23 PM  Result Value Ref Range   WBC 10.5 4.0 - 10.5 K/uL   RBC 4.00 3.87 - 5.11 MIL/uL   Hemoglobin 11.7 (L) 12.0 - 15.0  g/dL   HCT 09.6 (L) 04.5 - 40.9 %   MCV 86.5 78.0 - 100.0 fL   MCH 29.3 26.0 - 34.0 pg   MCHC 33.8 30.0 - 36.0 g/dL   RDW 81.1 91.4 - 78.2 %   Platelets 122 (L) 150 - 400 K/uL  Comprehensive metabolic panel     Status: Abnormal   Collection Time: 01/20/17  2:11 AM  Result Value Ref Range   Sodium 137 135 - 145 mmol/L   Potassium 4.1 3.5 - 5.1 mmol/L   Chloride 108 101 - 111 mmol/L   CO2 21 (L) 22 - 32 mmol/L   Glucose, Bld 116 (H) 65 - 99 mg/dL   BUN 8 6 - 20 mg/dL   Creatinine, Ser 9.56 0.44 - 1.00 mg/dL  Calcium 7.8 (L) 8.9 - 10.3 mg/dL   Total Protein 6.5 6.5 - 8.1 g/dL   Albumin 3.0 (L) 3.5 - 5.0 g/dL   AST 21 15 - 41 U/L   ALT 14 14 - 54 U/L   Alkaline Phosphatase 223 (H) 38 - 126 U/L   Total Bilirubin 0.6 0.3 - 1.2 mg/dL   GFR calc non Af Amer >60 >60 mL/min   GFR calc Af Amer >60 >60 mL/min   Anion gap 8 5 - 15  CBC     Status: Abnormal   Collection Time: 01/20/17  2:11 AM  Result Value Ref Range   WBC 10.0 4.0 - 10.5 K/uL   RBC 3.92 3.87 - 5.11 MIL/uL   Hemoglobin 11.6 (L) 12.0 - 15.0 g/dL   HCT 16.1 (L) 09.6 - 04.5 %   MCV 86.7 78.0 - 100.0 fL   MCH 29.6 26.0 - 34.0 pg   MCHC 34.1 30.0 - 36.0 g/dL   RDW 40.9 81.1 - 91.4 %   Platelets 104 (L) 150 - 400 K/uL   Foley fell out around 0600, cx 5/70/-2.  Ctx q 2-6 minutes, FHR Cat 1. Pt wants to eat some cereal, will start pitocin afterwards.

## 2017-01-20 NOTE — Progress Notes (Signed)
Labor Progress Note  Michelle Schultz is a 26 y.o. G1P0 at [redacted]w[redacted]d  admitted for induction of labor due to ?HELLP vs gHTN and gestational thrombocytopenia.  S: Comfortable. No concerns voiced.    O:  BP 125/89   Pulse 84   Temp 98.1 F (36.7 C) (Oral)   Resp 18   Ht  (1.575 m)   Wt 85.7 kg (189 lb)   LMP 04/11/2016 (LMP Unknown)   SpO2 99%   BMI 34.57 kg/m   Total I/O In: 636.2 [P.O.:200; I.V.:386.2; IV Piggyback:50] Out: 575 [Urine:575]  FHT:  FHR: 135 bpm, variability: moderate,  accelerations:  Present,  decelerations:  Absent UC:   regular, every 2-4 minutes SVE:   Dilation: 5 Effacement (%): 70 Station: -2 Exam by:: S Moyer   Pitocin @ 8 mu/min  Labs: Lab Results  Component Value Date   WBC 10.3 01/20/2017   HGB 11.8 (L) 01/20/2017   HCT 34.5 (L) 01/20/2017   MCV 85.8 01/20/2017   PLT 106 (L) 01/20/2017    Assessment / Plan: 26 y.o. G1P0 [redacted]w[redacted]d  in latent labor Induction of labor due to HELLP,  progressing well on pitocin  Labor: Progressing on Pitocin, will continue to increase then AROM  ?HELLP: on Mag, BPs wnl Fetal Wellbeing:  Category I Pain Control:  Labor support without medications, plan for epidural  Anticipated MOD:  NSVD  Expectant management   Caryl Ada, DO OB Fellow 01/20/2017, 10:35 AM

## 2017-01-21 ENCOUNTER — Inpatient Hospital Stay (HOSPITAL_COMMUNITY): Payer: 59

## 2017-01-21 ENCOUNTER — Encounter (HOSPITAL_COMMUNITY): Payer: Self-pay | Admitting: *Deleted

## 2017-01-21 ENCOUNTER — Encounter (HOSPITAL_COMMUNITY)
Admission: AD | Disposition: A | Discharge status: Home / Self Care | Payer: Self-pay | Source: Ambulatory Visit | Attending: Obstetrics & Gynecology

## 2017-01-21 DIAGNOSIS — O99824 Streptococcus B carrier state complicating childbirth: Secondary | ICD-10-CM

## 2017-01-21 DIAGNOSIS — R7989 Other specified abnormal findings of blood chemistry: Secondary | ICD-10-CM | POA: Diagnosis not present

## 2017-01-21 DIAGNOSIS — Z3A40 40 weeks gestation of pregnancy: Secondary | ICD-10-CM

## 2017-01-21 DIAGNOSIS — O1424 HELLP syndrome, complicating childbirth: Secondary | ICD-10-CM

## 2017-01-21 LAB — COMPREHENSIVE METABOLIC PANEL
ALK PHOS: 226 U/L — AB (ref 38–126)
ALT: 13 U/L — AB (ref 14–54)
AST: 28 U/L (ref 15–41)
Albumin: 2.5 g/dL — ABNORMAL LOW (ref 3.5–5.0)
Anion gap: 15 (ref 5–15)
BUN: 9 mg/dL (ref 6–20)
CALCIUM: 7.6 mg/dL — AB (ref 8.9–10.3)
CHLORIDE: 100 mmol/L — AB (ref 101–111)
CO2: 15 mmol/L — AB (ref 22–32)
CREATININE: 2.17 mg/dL — AB (ref 0.44–1.00)
GFR calc non Af Amer: 30 mL/min — ABNORMAL LOW (ref >60)
GFR, EST AFRICAN AMERICAN: 35 mL/min — AB (ref >60)
Glucose, Bld: 120 mg/dL — ABNORMAL HIGH (ref 65–99)
Potassium: 4.4 mmol/L (ref 3.5–5.1)
Sodium: 130 mmol/L — ABNORMAL LOW (ref 135–145)
Total Bilirubin: 0.8 mg/dL (ref 0.3–1.2)
Total Protein: 6.2 g/dL — ABNORMAL LOW (ref 6.5–8.1)

## 2017-01-21 LAB — CBC
HCT: 35.4 % — ABNORMAL LOW (ref 36.0–46.0)
Hemoglobin: 12 g/dL (ref 12.0–15.0)
MCH: 29.6 pg (ref 26.0–34.0)
MCHC: 33.9 g/dL (ref 30.0–36.0)
MCV: 87.4 fL (ref 78.0–100.0)
PLATELETS: 131 10*3/uL — AB (ref 150–400)
RBC: 4.05 MIL/uL (ref 3.87–5.11)
RDW: 14.2 % (ref 11.5–15.5)
WBC: 20 10*3/uL — AB (ref 4.0–10.5)

## 2017-01-21 LAB — PREPARE RBC (CROSSMATCH)

## 2017-01-21 LAB — MAGNESIUM: MAGNESIUM: 12.3 mg/dL — AB (ref 1.7–2.4)

## 2017-01-21 SURGERY — Surgical Case
Anesthesia: Epidural | Site: Abdomen | Wound class: Clean Contaminated

## 2017-01-21 MED ORDER — LACTATED RINGERS IV SOLN
INTRAVENOUS | Status: DC | PRN
  Administered 2017-01-21: 23:00:00 via INTRAVENOUS

## 2017-01-21 MED ORDER — FUROSEMIDE 10 MG/ML IJ SOLN: 20.0000 mg/h | Freq: Once | INTRAVENOUS | Status: DC

## 2017-01-21 MED ORDER — MORPHINE SULFATE (PF) 0.5 MG/ML IJ SOLN
INTRAMUSCULAR | Status: AC
  Filled 2017-01-21: qty 10

## 2017-01-21 MED ORDER — FENTANYL CITRATE (PF) 100 MCG/2ML IJ SOLN
25.0000 ug | INTRAMUSCULAR | Status: DC | PRN
  Administered 2017-01-22 (×2): 50 ug via INTRAVENOUS

## 2017-01-21 MED ORDER — MEPERIDINE HCL 25 MG/ML IJ SOLN: 6.2500 mg | INTRAMUSCULAR | Status: DC | PRN

## 2017-01-21 MED ORDER — FUROSEMIDE 10 MG/ML IJ SOLN
20.0000 mg | Freq: Once | INTRAMUSCULAR | Status: AC
  Administered 2017-01-21: 20 mg via INTRAVENOUS
  Filled 2017-01-21: qty 2

## 2017-01-21 MED ORDER — CALCIUM GLUCONATE 10 % IV SOLN
INTRAVENOUS | Status: AC
  Administered 2017-01-21: 21:00:00
  Filled 2017-01-21: qty 10

## 2017-01-21 MED ORDER — SCOPOLAMINE 1 MG/3DAYS TD PT72
MEDICATED_PATCH | TRANSDERMAL | Status: AC
  Filled 2017-01-21: qty 1

## 2017-01-21 MED ORDER — CEFAZOLIN SODIUM-DEXTROSE 2-4 GM/100ML-% IV SOLN: 2.0000 g | INTRAVENOUS | Status: DC

## 2017-01-21 MED ORDER — OXYTOCIN 10 UNIT/ML IJ SOLN
INTRAMUSCULAR | Status: AC
  Filled 2017-01-21: qty 4

## 2017-01-21 MED ORDER — SOD CITRATE-CITRIC ACID 500-334 MG/5ML PO SOLN: 30.0000 mL | ORAL | Status: DC

## 2017-01-21 MED ORDER — METOCLOPRAMIDE HCL 5 MG/ML IJ SOLN: 10.0000 mg | Freq: Once | INTRAMUSCULAR | Status: DC | PRN

## 2017-01-21 MED ORDER — LACTATED RINGERS IV SOLN: INTRAVENOUS | Status: DC

## 2017-01-21 MED ORDER — GENTAMICIN SULFATE 40 MG/ML IJ SOLN
140.0000 mg | Freq: Three times a day (TID) | INTRAVENOUS | Status: DC
  Administered 2017-01-21: 140 mg via INTRAVENOUS
  Filled 2017-01-21 (×3): qty 3.5

## 2017-01-21 MED ORDER — OXYTOCIN 10 UNIT/ML IJ SOLN
INTRAVENOUS | Status: DC | PRN
  Administered 2017-01-21: 40 [IU] via INTRAVENOUS

## 2017-01-21 MED ORDER — BUPIVACAINE HCL (PF) 0.5 % IJ SOLN
INTRAMUSCULAR | Status: AC
  Filled 2017-01-21: qty 30

## 2017-01-21 MED ORDER — CEFAZOLIN SODIUM-DEXTROSE 2-3 GM-% IV SOLR
INTRAVENOUS | Status: DC | PRN
  Administered 2017-01-21: 2 g via INTRAVENOUS

## 2017-01-21 MED ORDER — DEXTROSE 5 % IV SOLN
500.0000 mg | Freq: Once | INTRAVENOUS | Status: AC
  Administered 2017-01-21: 500 mg via INTRAVENOUS
  Filled 2017-01-21: qty 500

## 2017-01-21 MED ORDER — MORPHINE SULFATE (PF) 0.5 MG/ML IJ SOLN
INTRAMUSCULAR | Status: DC | PRN
  Administered 2017-01-21: 4 mg via EPIDURAL

## 2017-01-21 MED ORDER — CEFAZOLIN SODIUM-DEXTROSE 2-4 GM/100ML-% IV SOLN
INTRAVENOUS | Status: AC
  Filled 2017-01-21: qty 100

## 2017-01-21 MED ORDER — SODIUM CHLORIDE 0.9 % IV SOLN
Freq: Once | INTRAVENOUS | Status: AC
  Administered 2017-01-21: 22:00:00 via INTRAVENOUS

## 2017-01-21 MED ORDER — ONDANSETRON HCL 4 MG/2ML IJ SOLN
INTRAMUSCULAR | Status: AC
  Filled 2017-01-21: qty 2

## 2017-01-21 MED ORDER — SCOPOLAMINE 1 MG/3DAYS TD PT72
MEDICATED_PATCH | TRANSDERMAL | Status: DC | PRN
  Administered 2017-01-21: 1 via TRANSDERMAL

## 2017-01-21 MED ORDER — SODIUM CHLORIDE 0.9 % IV SOLN: 1.0000 g | Freq: Once | INTRAVENOUS | Status: DC

## 2017-01-21 MED ORDER — ONDANSETRON HCL 4 MG/2ML IJ SOLN
INTRAMUSCULAR | Status: DC | PRN
  Administered 2017-01-21: 4 mg via INTRAVENOUS

## 2017-01-21 MED ORDER — BUPIVACAINE HCL (PF) 0.5 % IJ SOLN
INTRAMUSCULAR | Status: DC | PRN
  Administered 2017-01-21: 30 mL

## 2017-01-21 SURGICAL SUPPLY — 33 items
CHLORAPREP W/TINT 26ML (MISCELLANEOUS) ×3 IMPLANT
CLAMP CORD UMBIL (MISCELLANEOUS) IMPLANT
CLOTH BEACON ORANGE TIMEOUT ST (SAFETY) ×3 IMPLANT
DRSG OPSITE POSTOP 4X10 (GAUZE/BANDAGES/DRESSINGS) ×3 IMPLANT
ELECT REM PT RETURN 9FT ADLT (ELECTROSURGICAL) ×3
ELECTRODE REM PT RTRN 9FT ADLT (ELECTROSURGICAL) ×1 IMPLANT
EXTRACTOR VACUUM M CUP 4 TUBE (SUCTIONS) IMPLANT
EXTRACTOR VACUUM M CUP 4' TUBE (SUCTIONS)
GLOVE BIOGEL PI IND STRL 7.0 (GLOVE) ×3 IMPLANT
GLOVE BIOGEL PI INDICATOR 7.0 (GLOVE) ×6
GLOVE ECLIPSE 7.0 STRL STRAW (GLOVE) ×3 IMPLANT
GOWN STRL REUS W/TWL LRG LVL3 (GOWN DISPOSABLE) ×6 IMPLANT
KIT ABG SYR 3ML LUER SLIP (SYRINGE) ×6 IMPLANT
NEEDLE HYPO 22GX1.5 SAFETY (NEEDLE) ×3 IMPLANT
NEEDLE HYPO 25X5/8 SAFETYGLIDE (NEEDLE) ×6 IMPLANT
NS IRRIG 1000ML POUR BTL (IV SOLUTION) ×3 IMPLANT
PACK C SECTION WH (CUSTOM PROCEDURE TRAY) ×3 IMPLANT
PAD ABD 7.5X8 STRL (GAUZE/BANDAGES/DRESSINGS) ×3 IMPLANT
PAD ABD 8X10 STRL (GAUZE/BANDAGES/DRESSINGS) ×3 IMPLANT
PAD OB MATERNITY 4.3X12.25 (PERSONAL CARE ITEMS) ×3 IMPLANT
PENCIL SMOKE EVAC W/HOLSTER (ELECTROSURGICAL) ×3 IMPLANT
RTRCTR C-SECT PINK 25CM LRG (MISCELLANEOUS) ×3 IMPLANT
SPONGE GAUZE 4X4 12PLY (GAUZE/BANDAGES/DRESSINGS) ×3 IMPLANT
SUT PDS AB 0 CTX 36 PDP370T (SUTURE) IMPLANT
SUT PLAIN 2 0 (SUTURE) ×2
SUT PLAIN 2 0 XLH (SUTURE) IMPLANT
SUT PLAIN ABS 2-0 CT1 27XMFL (SUTURE) ×1 IMPLANT
SUT VIC AB 0 CTX 36 (SUTURE) ×6
SUT VIC AB 0 CTX36XBRD ANBCTRL (SUTURE) ×3 IMPLANT
SUT VIC AB 4-0 KS 27 (SUTURE) ×3 IMPLANT
SYR CONTROL 10ML LL (SYRINGE) ×3 IMPLANT
TOWEL OR 17X24 6PK STRL BLUE (TOWEL DISPOSABLE) ×3 IMPLANT
TRAY FOLEY BAG SILVER LF 14FR (SET/KITS/TRAYS/PACK) ×3 IMPLANT

## 2017-01-21 NOTE — Progress Notes (Signed)
Lab Addendum  Results for orders placed or performed during the hospital encounter of 01/19/17 (from the past 24 hour(s))  Prepare RBC     Status: None   Collection Time: 01/21/17  5:30 PM  Result Value Ref Range   Order Confirmation ORDER PROCESSED BY BLOOD BANK   Magnesium     Status: Abnormal   Collection Time: 01/21/17  7:45 PM  Result Value Ref Range   Magnesium 12.3 (HH) 1.7 - 2.4 mg/dL  Comprehensive metabolic panel     Status: Abnormal   Collection Time: 01/21/17  7:45 PM  Result Value Ref Range   Sodium 130 (L) 135 - 145 mmol/L   Potassium 4.4 3.5 - 5.1 mmol/L   Chloride 100 (L) 101 - 111 mmol/L   CO2 15 (L) 22 - 32 mmol/L   Glucose, Bld 120 (H) 65 - 99 mg/dL   BUN 9 6 - 20 mg/dL   Creatinine, Ser 1.61 (H) 0.44 - 1.00 mg/dL   Calcium 7.6 (L) 8.9 - 10.3 mg/dL   Total Protein 6.2 (L) 6.5 - 8.1 g/dL   Albumin 2.5 (L) 3.5 - 5.0 g/dL   AST 28 15 - 41 U/L   ALT 13 (L) 14 - 54 U/L   Alkaline Phosphatase 226 (H) 38 - 126 U/L   Total Bilirubin 0.8 0.3 - 1.2 mg/dL   GFR calc non Af Amer 30 (L) >60 mL/min   GFR calc Af Amer 35 (L) >60 mL/min   Anion gap 15 5 - 15  CBC     Status: Abnormal   Collection Time: 01/21/17  9:44 PM  Result Value Ref Range   WBC 20.0 (H) 4.0 - 10.5 K/uL   RBC 4.05 3.87 - 5.11 MIL/uL   Hemoglobin 12.0 12.0 - 15.0 g/dL   HCT 09.6 (L) 04.5 - 40.9 %   MCV 87.4 78.0 - 100.0 fL   MCH 29.6 26.0 - 34.0 pg   MCHC 33.9 30.0 - 36.0 g/dL   RDW 81.1 91.4 - 78.2 %   Platelets 131 (L) 150 - 400 K/uL   Cr 2.17, consistent with effects of magnesium sulfate toxicity. Will continue to hold for now.  Recheck tomorrow morning.  Platelets improved to 131K, this is reassuring for upcoming cesarean section.    Jaynie Collins, MD, FACOG Attending Obstetrician & Gynecologist, [facility] for [employer], [facility]

## 2017-01-21 NOTE — Op Note (Signed)
Michelle Schultz PROCEDURE DATE: 01/21/2017  PREOPERATIVE DIAGNOSES: Intrauterine pregnancy at [redacted]w[redacted]d weeks gestation; failure to progress: arrest of descent and failed attempt of vaccum delivery; HELLP syndrome; s/p treatment for high serum magnesium level  POSTOPERATIVE DIAGNOSES: The same  PROCEDURE: Primary Low Transverse Cesarean Section  SURGEON:  Dr. Jaynie Collins  ASSISTANT:  Dr. Raynelle Fanning Degele  ANESTHESIOLOGY TEAM: Anesthesiologist: Phillips Grout, MD; Mal Amabile, MD; Odette Fraction, MD CRNA: Armanda Heritage, CRNA  INDICATIONS: Michelle Schultz is a 26 y.o. G1P1001 at [redacted]w[redacted]d here for cesarean section secondary to the indications listed under preoperative diagnoses; please see preoperative note for further details.  The risks of cesarean section were discussed with the patient including but were not limited to: bleeding which may require transfusion or reoperation; infection which may require antibiotics; injury to bowel, bladder, ureters or other surrounding organs; injury to the fetus; need for additional procedures including hysterectomy in the event of a life-threatening hemorrhage; placental abnormalities wth subsequent pregnancies, incisional problems, thromboembolic phenomenon and other postoperative/anesthesia complications.   The patient concurred with the proposed plan, giving informed written consent for the procedure.    FINDINGS:  Viable female infant in cephalic presentation.  Apgars 5 and 7. Nuchal cord x 1.  Clear amniotic fluid.  Intact placenta, three vessel cord.  Normal uterus, fallopian tubes and ovaries bilaterally.  ANESTHESIA: Epidural  INTRAVENOUS FLUIDS: 600 ml   ESTIMATED BLOOD LOSS: 1130 ml URINE OUTPUT:  150 ml SPECIMENS: Placenta sent to pathology COMPLICATIONS: None immediate  PROCEDURE IN DETAIL:  The patient preoperatively received intravenous antibiotics and had sequential compression devices applied to her lower extremities.  She was then taken  to the operating room where the epidural anesthesia was dosed up to surgical level and was found to be adequate. She was then placed in a dorsal supine position with a leftward tilt, and prepped and draped in a sterile manner.  A foley catheter was placed into her bladder and attached to constant gravity.  After an adequate timeout was performed, a Pfannenstiel skin incision was made with scalpel and carried through to the underlying layer of fascia. The fascia was incised in the midline, and this incision was extended bilaterally using the Mayo scissors.  Kocher clamps were applied to the superior aspect of the fascial incision and the underlying rectus muscles were dissected off bluntly.  A similar process was carried out on the inferior aspect of the fascial incision. The rectus muscles were separated in the midline bluntly and the peritoneum was entered bluntly. Attention was turned to the lower uterine segment where a low transverse hysterotomy was made with a scalpel and extended bilaterally bluntly.  The infant was successfully delivered, the cord was immediately clamped and cut due to concern about infant's status, and the infant was handed over to the awaiting neonatology team. Uterine massage was then administered, and the placenta delivered intact with a three-vessel cord. The uterus was then cleared of clots and debris.  The hysterotomy was closed with 0 Vicryl in a running locked fashion, and an imbricating layer was also placed with 0 Vicryl.  Figure-of-eight 0 Vicryl serosal stitches were placed to help with hemostasis.  The pelvis was cleared of all clot and debris. Hemostasis was confirmed on all surfaces.  The peritoneum was closed with a 0 Vicryl running stitch. The fascia was then closed using 0 Vicryl in a running fashion.  The subcutaneous layer was irrigated, then reapproximated with 2-0 plain gut interrupted stitches, and 30 ml of 0.5%  Marcaine was injected subcutaneously around the incision.   The skin was closed with a 4-0 Vicryl subcuticular stitch. The patient tolerated the procedure well. Sponge, lap, instrument and needle counts were correct x 3.  She was taken to the recovery room in stable condition.    Jaynie Collins, MD, FACOG Attending Obstetrician & Gynecologist Faculty Practice, [facility]

## 2017-01-21 NOTE — Progress Notes (Signed)
Pushing x30 min. Baby likely direct OP. Good maternal effort, but slow movement downward. FHR 150, mod to min - good accels with scalp stim, occ variables, no lates, q3-4 min. Will attempt pushing on side to help roll baby. Burna Cash, MD Family Medicine Resident, PGY-3 01/21/2017 7:35 PM

## 2017-01-21 NOTE — Progress Notes (Signed)
RN received call from Lab of critical lab value.  Order put in for stat mag level blood draw.  Lab was drawn at 1945.  At 2041 Lab still not resulted.  RN called lab to inquire about stat lab value.  2049 Lab called RN back to report critical magnesium value of 12.3.  Looking back at labs, result viewer shows labs resulted at 1945, but were not showing a value until after call was received by RN at 2049

## 2017-01-21 NOTE — Progress Notes (Signed)
Pharmacy Antibiotic Note  Sandee Bernath is a 26 y.o. female admitted on 01/19/2017 with chorioamnionitis.  Pharmacy has been consulted for gentamicin dosing.  Plan: Gentamicin  IV q8hrs.   Height:  (157.5 cm) Weight: 189 lb (85.7 kg) IBW/kg (Calculated) : 50.1  Temp (24hrs), Avg:98.9 F (37.2 C), Min:97.9 F (36.6 C), Max:99.7 F (37.6 C)   Recent Labs Lab 01/19/17 1105 01/19/17 2022 01/19/17 2023 01/20/17 0211 01/20/17 0754 01/20/17 2031  WBC 8.3  --  10.5 10.0 10.3 10.9*  CREATININE 0.73 0.67  --  0.76 0.70 0.79    Estimated Creatinine Clearance: 107.2 mL/min (by C-G formula based on SCr of 0.79 mg/dL).    No Known Allergies  Antimicrobials this admission: Pen G 5 million units IV x1, then  3 million units iv q4hrs   Thank you for allowing pharmacy to be a part of this patient's care.  Wendie Simmer, PharmD, BCPS Clinical Pharmacist

## 2017-01-21 NOTE — Progress Notes (Signed)
Patient called out for increased pressure. Attempted a practice push with anterior lip, but cervix not reducible. Continue labor down. Pit at 38. Hopeful for progression to complete and then will start pushing. Burna Cash, MD Family Medicine Resident, PGY-3 01/21/2017 6:21 PM

## 2017-01-21 NOTE — Progress Notes (Signed)
Indication for operative vaginal delivery:  Failure of cervical descent, maternal exhaustion  Risks of vacuum assistance were discussed in detail, including but not limited to, bleeding, infection, damage to maternal tissues, fetal cephalohematoma, inability to effect vaginal delivery of the head or shoulder dystocia that cannot be resolved by established maneuvers and need for emergency cesarean section.  Patient gave verbal consent.  Patient was examined and found to be fully dilated with fetal station of +2. The soft vacuum soft cup was positioned over the sagittal suture 3 cm anterior to posterior fontanelle.  Pressure was then increased to 500 mmHg, and the patient was instructed to push.  Pulling was administered along the pelvic curve.  3 pulls were administered during 3 pushes, one popoff. There was no significant further descent, so the procedure was aborted.  Cesarean section recommended.  The risks of cesarean section discussed with the patient included but were not limited to: bleeding which may require transfusion or reoperation; infection which may require antibiotics; injury to bowel, bladder, ureters or other surrounding organs; injury to the fetus; need for additional procedures including hysterectomy in the event of a life-threatening hemorrhage; placental abnormalities wth subsequent pregnancies, incisional problems, thromboembolic phenomenon and other postoperative/anesthesia complications. The patient concurred with the proposed plan, giving informed written consent for the procedure.   Anesthesia and OR aware. Preoperative prophylactic antibiotics and SCDs ordered on call to the OR.  To OR when ready.   Jaynie Collins, MD, FACOG Attending Obstetrician & Gynecologist, [facility] for [employer], [facility]

## 2017-01-21 NOTE — Progress Notes (Addendum)
  Patient with increased work of breathing and exhaustion.   BP (!) 156/94   Pulse (!) 103   Temp 99.3 F (37.4 C) (Oral)   Resp 16   Ht  (1.575 m)   Wt 189 lb (85.7 kg)   LMP 04/11/2016 (LMP Unknown)   SpO2 98%   BMI 34.57 kg/m   On exam, mildly coarse breath sounds at the bases. Heart mildly tachycardic, normal rhythm, systolic murmur.  SVE 10/100/1-2+  Magnesium level 12.3   --IV mag was stopped --IV calcium gluconate and IV Lasix 20 mg given --Repeat CBC and CMP ordered --CXR ordered -- showing vascular congestion --Will continue to monitor off mag and recheck labs in about  4 hours. --Dr. Macon Large discussed management options of delivery including vacuum-assisted delivery and cesarean sections. Risk and benefits fo each discussed. Pt opted for vacuum-assisted delivery.   Raynelle Fanning P. Denym Christenberry, MD OB Fellow

## 2017-01-21 NOTE — Progress Notes (Signed)
Patient ID: Summerlynn Glauser, female   DOB: 15-Mar-1991, 26 y.o.   MRN: [id number]  Comfortable w/ epidural; had Pit break x 4h in the night with restart @ 0430; on mag  BP 137/84, other VSS FHR 130s, +accels, no decels Ctx irreg 2-6 mins w/ dampened IUPC waveform on Pit @ 62mu/min Cx deferred, but was 6/90/-1 at restart of Pit  IUP@term  HELLP v gHTN w/ ITP Latent phase vs protracted active phase  -Will continue to increase Pit to get ctx reg; unfortunately we are unable to show adequate labor/MVUs due to IUPC waveform using 3 different catheters - plan to examine cx once ctx more reg  Cam Hai 01/21/2017

## 2017-01-21 NOTE — Progress Notes (Signed)
Comfortable without any sensation of pressure. Family supportive at bedside. VS showing mild range pressures, remains asymptomatic. SVE deferred. Pit running at 20 (had IUPC x3 placed O/N). FHR alternates cat 1 and 2 (occ minimal variability and limited accels), ctx's q4-8 min. Continuing mag; level O/N 5.9. Continue current management. Burna Cash, MD Family Medicine Resident, PGY-3 01/21/2017 12:50 PM

## 2017-01-21 NOTE — Transfer of Care (Signed)
Immediate Anesthesia Transfer of Care Note  Patient: Michelle Schultz  Procedure(s) Performed: Procedure(s): CESAREAN SECTION (N/A)  Patient Location: PACU  Anesthesia Type:Epidural  Level of Consciousness: awake, alert  and oriented  Airway & Oxygen Therapy: Patient Spontanous Breathing  Post-op Assessment: Report given to RN and Post -op Vital signs reviewed and stable  Post vital signs: Reviewed and stable  Last Vitals:  Vitals:   01/21/17 1843 01/21/17 1900  BP:  (!) 156/94  Pulse:  (!) 103  Resp:    Temp: 37.4 C   SpO2:      Last Pain:  Vitals:   01/21/17 1843  TempSrc: Oral  PainSc:       Patients Stated Pain Goal: 3 (01/21/17 0900)  Complications: No apparent anesthesia complications

## 2017-01-21 NOTE — Progress Notes (Signed)
Family remains supportive at bedside. SVE 9.5//100/0 to +1. FHR 130, min to mod - great accel during SVE and scalp stim, occ accels, no decels, q2. Pit running at 32 > increase to 34. Burna Cash, MD Family Medicine Resident, PGY-3 01/21/2017 4:32 PM

## 2017-01-21 NOTE — Progress Notes (Signed)
Labor Progress Note Karlena Luebke is a 26 y.o. G1P0 at [redacted]w[redacted]d presented for questionable HELLP vs gHTN and gestational thrombocytopenia  S: Patient is feeling tired but no complaints otherwise  O:  BP 126/76   Pulse 85   Temp 98 F (36.7 C) (Oral)   Resp 18   Ht  (1.575 m)   Wt 85.7 kg (189 lb)   LMP 04/11/2016 (LMP Unknown)   SpO2 98%   BMI 34.57 kg/m    CVE: Dilation: 7 Effacement (%): 50 Cervical Position: Middle Station: -1 Presentation: Vertex Exam by:: L.mears,RN   A&P: 26 y.o. G1P0 [redacted]w[redacted]d here for IOL for questionable HELLP vs gHTN and gestational thrombocytopenia #Labor: Progressing after restarting pitocin this morning around 0430. Continue to monitor. Recheck in few hours. #Pain: epidural #GBS positive   Rolm Bookbinder, DO 10:37 AM

## 2017-01-22 ENCOUNTER — Encounter (HOSPITAL_COMMUNITY): Payer: Self-pay | Admitting: Obstetrics & Gynecology

## 2017-01-22 LAB — DIC (DISSEMINATED INTRAVASCULAR COAGULATION) PANEL
APTT: 31 s (ref 24–36)
FIBRINOGEN: 479 mg/dL — AB (ref 210–475)
SMEAR REVIEW: NONE SEEN

## 2017-01-22 LAB — BPAM RBC
BLOOD PRODUCT EXPIRATION DATE: 201809252359
Blood Product Expiration Date: 201809252359
UNIT TYPE AND RH: 6200
Unit Type and Rh: 6200

## 2017-01-22 LAB — CBC
HCT: 23.5 % — ABNORMAL LOW (ref 36.0–46.0)
HEMATOCRIT: 26.8 % — AB (ref 36.0–46.0)
HEMOGLOBIN: 9.3 g/dL — AB (ref 12.0–15.0)
Hemoglobin: 8.1 g/dL — ABNORMAL LOW (ref 12.0–15.0)
MCH: 30.4 pg (ref 26.0–34.0)
MCH: 30.1 pg (ref 26.0–34.0)
MCHC: 34.7 g/dL (ref 30.0–36.0)
MCHC: 34.5 g/dL (ref 30.0–36.0)
MCV: 87.6 fL (ref 78.0–100.0)
MCV: 87.4 fL (ref 78.0–100.0)
PLATELETS: 117 10*3/uL — AB (ref 150–400)
Platelets: 131 10*3/uL — ABNORMAL LOW (ref 150–400)
RBC: 3.06 MIL/uL — ABNORMAL LOW (ref 3.87–5.11)
RBC: 2.69 MIL/uL — AB (ref 3.87–5.11)
RDW: 14.4 % (ref 11.5–15.5)
RDW: 14.4 % (ref 11.5–15.5)
WBC: 16.6 10*3/uL — ABNORMAL HIGH (ref 4.0–10.5)
WBC: 12.3 10*3/uL — AB (ref 4.0–10.5)

## 2017-01-22 LAB — TYPE AND SCREEN
ABO/RH(D): A POS
ANTIBODY SCREEN: NEGATIVE
UNIT DIVISION: 0
Unit division: 0

## 2017-01-22 LAB — DIC (DISSEMINATED INTRAVASCULAR COAGULATION)PANEL
D-Dimer, Quant: 17.76 ug/mL-FEU — ABNORMAL HIGH (ref 0.00–0.50)
INR: 1.25
Platelets: 131 10*3/uL — ABNORMAL LOW (ref 150–400)
Prothrombin Time: 15.6 seconds — ABNORMAL HIGH (ref 11.4–15.2)

## 2017-01-22 LAB — COMPREHENSIVE METABOLIC PANEL
ALBUMIN: 2.1 g/dL — AB (ref 3.5–5.0)
ALBUMIN: 2 g/dL — AB (ref 3.5–5.0)
ALK PHOS: 178 U/L — AB (ref 38–126)
ALT: 11 U/L — ABNORMAL LOW (ref 14–54)
ALT: 9 U/L — AB (ref 14–54)
ANION GAP: 13 (ref 5–15)
AST: 30 U/L (ref 15–41)
AST: 23 U/L (ref 15–41)
Alkaline Phosphatase: 153 U/L — ABNORMAL HIGH (ref 38–126)
Anion gap: 9 (ref 5–15)
BILIRUBIN TOTAL: 0.3 mg/dL (ref 0.3–1.2)
BUN: 10 mg/dL (ref 6–20)
BUN: 13 mg/dL (ref 6–20)
CHLORIDE: 101 mmol/L (ref 101–111)
CO2: 18 mmol/L — ABNORMAL LOW (ref 22–32)
CO2: 22 mmol/L (ref 22–32)
CREATININE: 2.19 mg/dL — AB (ref 0.44–1.00)
Calcium: 7.6 mg/dL — ABNORMAL LOW (ref 8.9–10.3)
Calcium: 7.4 mg/dL — ABNORMAL LOW (ref 8.9–10.3)
Chloride: 99 mmol/L — ABNORMAL LOW (ref 101–111)
Creatinine, Ser: 2.29 mg/dL — ABNORMAL HIGH (ref 0.44–1.00)
GFR calc non Af Amer: 30 mL/min — ABNORMAL LOW (ref >60)
GFR, EST AFRICAN AMERICAN: 33 mL/min — AB (ref >60)
GFR, EST AFRICAN AMERICAN: 34 mL/min — AB (ref >60)
GFR, EST NON AFRICAN AMERICAN: 28 mL/min — AB (ref >60)
GLUCOSE: 172 mg/dL — AB (ref 65–99)
GLUCOSE: 94 mg/dL (ref 65–99)
POTASSIUM: 4.8 mmol/L (ref 3.5–5.1)
Potassium: 4.9 mmol/L (ref 3.5–5.1)
SODIUM: 132 mmol/L — AB (ref 135–145)
Sodium: 130 mmol/L — ABNORMAL LOW (ref 135–145)
TOTAL PROTEIN: 5.1 g/dL — AB (ref 6.5–8.1)
Total Bilirubin: 0.3 mg/dL (ref 0.3–1.2)
Total Protein: 4.8 g/dL — ABNORMAL LOW (ref 6.5–8.1)

## 2017-01-22 LAB — MAGNESIUM
MAGNESIUM: 6.7 mg/dL — AB (ref 1.7–2.4)
Magnesium: 8.3 mg/dL (ref 1.7–2.4)

## 2017-01-22 LAB — PREPARE RBC (CROSSMATCH)

## 2017-01-22 MED ORDER — NALBUPHINE HCL 10 MG/ML IJ SOLN: 5.0000 mg | Freq: Once | INTRAMUSCULAR | Status: DC | PRN

## 2017-01-22 MED ORDER — FERROUS SULFATE 325 (65 FE) MG PO TABS
325.0000 mg | ORAL_TABLET | Freq: Two times a day (BID) | ORAL | Status: DC
  Administered 2017-01-22 – 2017-01-24 (×5): 325 mg via ORAL
  Filled 2017-01-22 (×6): qty 1

## 2017-01-22 MED ORDER — NALBUPHINE HCL 10 MG/ML IJ SOLN: 5.0000 mg | INTRAMUSCULAR | Status: DC | PRN

## 2017-01-22 MED ORDER — SODIUM CHLORIDE 0.9% FLUSH
3.0000 mL | INTRAVENOUS | Status: DC | PRN
  Administered 2017-01-23: 3 mL via INTRAVENOUS
  Filled 2017-01-22: qty 3

## 2017-01-22 MED ORDER — SENNOSIDES-DOCUSATE SODIUM 8.6-50 MG PO TABS
2.0000 | ORAL_TABLET | ORAL | Status: DC
  Administered 2017-01-22 – 2017-01-24 (×2): 2 via ORAL
  Filled 2017-01-22 (×2): qty 2

## 2017-01-22 MED ORDER — HYDROMORPHONE HCL 1 MG/ML IJ SOLN
1.0000 mg | INTRAMUSCULAR | Status: DC | PRN
  Administered 2017-01-22: 1 mg via INTRAVENOUS

## 2017-01-22 MED ORDER — OXYCODONE-ACETAMINOPHEN 5-325 MG PO TABS
2.0000 | ORAL_TABLET | ORAL | Status: DC | PRN
  Administered 2017-01-24: 2 via ORAL
  Filled 2017-01-22: qty 2

## 2017-01-22 MED ORDER — IBUPROFEN 600 MG PO TABS: 600.0000 mg | ORAL_TABLET | Freq: Four times a day (QID) | ORAL | Status: DC

## 2017-01-22 MED ORDER — NALOXONE HCL 2 MG/2ML IJ SOSY
1.0000 ug/kg/h | PREFILLED_SYRINGE | INTRAVENOUS | Status: DC | PRN
  Filled 2017-01-22: qty 2

## 2017-01-22 MED ORDER — OXYCODONE-ACETAMINOPHEN 5-325 MG PO TABS
1.0000 | ORAL_TABLET | ORAL | Status: DC | PRN
  Administered 2017-01-22 – 2017-01-24 (×2): 1 via ORAL
  Filled 2017-01-22 (×2): qty 1

## 2017-01-22 MED ORDER — DIPHENHYDRAMINE HCL 25 MG PO CAPS: 25.0000 mg | ORAL_CAPSULE | Freq: Four times a day (QID) | ORAL | Status: DC | PRN

## 2017-01-22 MED ORDER — ONDANSETRON HCL 4 MG/2ML IJ SOLN: 4.0000 mg | Freq: Three times a day (TID) | INTRAMUSCULAR | Status: DC | PRN

## 2017-01-22 MED ORDER — MEASLES, MUMPS & RUBELLA VAC ~~LOC~~ INJ
0.5000 mL | INJECTION | Freq: Once | SUBCUTANEOUS | Status: DC
  Filled 2017-01-22 (×2): qty 0.5

## 2017-01-22 MED ORDER — MISOPROSTOL 200 MCG PO TABS
1000.0000 ug | ORAL_TABLET | Freq: Once | ORAL | Status: AC
  Administered 2017-01-22: 1000 ug via RECTAL

## 2017-01-22 MED ORDER — WITCH HAZEL-GLYCERIN EX PADS: 1.0000 "application " | MEDICATED_PAD | CUTANEOUS | Status: DC | PRN

## 2017-01-22 MED ORDER — LACTATED RINGERS IV SOLN
INTRAVENOUS | Status: DC
  Administered 2017-01-22: 08:00:00 via INTRAVENOUS

## 2017-01-22 MED ORDER — ZOLPIDEM TARTRATE 5 MG PO TABS: 5.0000 mg | ORAL_TABLET | Freq: Every evening | ORAL | Status: DC | PRN

## 2017-01-22 MED ORDER — MENTHOL 3 MG MT LOZG: 1.0000 | LOZENGE | OROMUCOSAL | Status: DC | PRN

## 2017-01-22 MED ORDER — MISOPROSTOL 200 MCG PO TABS
ORAL_TABLET | ORAL | Status: AC
  Administered 2017-01-22: 1000 ug via RECTAL
  Filled 2017-01-22: qty 1

## 2017-01-22 MED ORDER — HYDROMORPHONE HCL 1 MG/ML IJ SOLN
INTRAMUSCULAR | Status: AC
  Filled 2017-01-22: qty 1

## 2017-01-22 MED ORDER — SIMETHICONE 80 MG PO CHEW: 80.0000 mg | CHEWABLE_TABLET | ORAL | Status: DC | PRN

## 2017-01-22 MED ORDER — DIBUCAINE 1 % RE OINT: 1.0000 "application " | TOPICAL_OINTMENT | RECTAL | Status: DC | PRN

## 2017-01-22 MED ORDER — FENTANYL CITRATE (PF) 100 MCG/2ML IJ SOLN
INTRAMUSCULAR | Status: AC
  Filled 2017-01-22: qty 2

## 2017-01-22 MED ORDER — ACETAMINOPHEN 325 MG PO TABS
650.0000 mg | ORAL_TABLET | ORAL | Status: DC | PRN
  Administered 2017-01-22 – 2017-01-23 (×2): 650 mg via ORAL
  Filled 2017-01-22 (×2): qty 2

## 2017-01-22 MED ORDER — TETANUS-DIPHTH-ACELL PERTUSSIS 5-2.5-18.5 LF-MCG/0.5 IM SUSP: 0.5000 mL | Freq: Once | INTRAMUSCULAR | Status: DC

## 2017-01-22 MED ORDER — MAGNESIUM HYDROXIDE 400 MG/5ML PO SUSP: 30.0000 mL | ORAL | Status: DC | PRN

## 2017-01-22 MED ORDER — DIPHENHYDRAMINE HCL 25 MG PO CAPS: 25.0000 mg | ORAL_CAPSULE | ORAL | Status: DC | PRN

## 2017-01-22 MED ORDER — DIPHENHYDRAMINE HCL 50 MG/ML IJ SOLN: 12.5000 mg | INTRAMUSCULAR | Status: DC | PRN

## 2017-01-22 MED ORDER — OXYTOCIN 40 UNITS IN LACTATED RINGERS INFUSION - SIMPLE MED: 2.5000 [IU]/h | INTRAVENOUS | Status: AC

## 2017-01-22 MED ORDER — NALOXONE HCL 0.4 MG/ML IJ SOLN: 0.4000 mg | INTRAMUSCULAR | Status: DC | PRN

## 2017-01-22 MED ORDER — HYDRALAZINE HCL 20 MG/ML IJ SOLN: 10.0000 mg | Freq: Once | INTRAMUSCULAR | Status: DC | PRN

## 2017-01-22 MED ORDER — COCONUT OIL OIL: 1.0000 "application " | TOPICAL_OIL | Status: DC | PRN

## 2017-01-22 MED ORDER — LABETALOL HCL 5 MG/ML IV SOLN: 20.0000 mg | INTRAVENOUS | Status: DC | PRN

## 2017-01-22 MED ORDER — METHYLERGONOVINE MALEATE 0.2 MG/ML IJ SOLN
0.2000 mg | Freq: Once | INTRAMUSCULAR | Status: DC
  Filled 2017-01-22: qty 1

## 2017-01-22 MED ORDER — METHYLERGONOVINE MALEATE 0.2 MG/ML IJ SOLN
0.2000 mg | Freq: Once | INTRAMUSCULAR | Status: AC
  Administered 2017-01-22: 0.2 mg via INTRAMUSCULAR

## 2017-01-22 MED ORDER — PRENATAL MULTIVITAMIN CH
1.0000 | ORAL_TABLET | Freq: Every day | ORAL | Status: DC
  Administered 2017-01-22 – 2017-01-24 (×2): 1 via ORAL
  Filled 2017-01-22 (×2): qty 1

## 2017-01-22 MED ORDER — SIMETHICONE 80 MG PO CHEW
80.0000 mg | CHEWABLE_TABLET | ORAL | Status: DC
  Administered 2017-01-22 – 2017-01-24 (×2): 80 mg via ORAL
  Filled 2017-01-22 (×2): qty 1

## 2017-01-22 MED ORDER — METHYLERGONOVINE MALEATE 0.2 MG/ML IJ SOLN
INTRAMUSCULAR | Status: AC
  Filled 2017-01-22: qty 1

## 2017-01-22 NOTE — Progress Notes (Signed)
RN called OBRR nurse to assess pt

## 2017-01-22 NOTE — Addendum Note (Signed)
Addendum  created 01/22/17 1321 by Angela Adam, CRNA   Charge Capture section accepted, Sign clinical note

## 2017-01-22 NOTE — Progress Notes (Signed)
CRITICAL VALUE ALERT  Critical Value:  Magnesium 6.7  Date & Time Notied:  01/22/17 0940  Provider Notified: Dr. Emelda Fear  Orders Received/Actions taken: No new orders

## 2017-01-22 NOTE — Progress Notes (Deleted)
[facility] called to assess patient

## 2017-01-22 NOTE — Progress Notes (Signed)
To NICU via w/c. Patient stated she preferred to take iron supplement with food and she had not yet received her tray. Instructed her to call nurse when she received her tray.

## 2017-01-22 NOTE — Anesthesia Postprocedure Evaluation (Signed)
Anesthesia Post Note  Patient: Michelle Schultz  Procedure(s) Performed: Procedure(s) (LRB): CESAREAN SECTION (N/A)     Patient location during evaluation: PACU Anesthesia Type: Epidural Level of consciousness: awake and alert and oriented Pain management: pain level controlled Vital Signs Assessment: post-procedure vital signs reviewed and stable Respiratory status: spontaneous breathing, nonlabored ventilation, respiratory function stable and patient connected to nasal cannula oxygen Cardiovascular status: blood pressure returned to baseline and stable Postop Assessment: no headache, no backache, epidural receding and no apparent nausea or vomiting Anesthetic complications: no    Last Vitals:  Vitals:   01/21/17 2336 01/21/17 2345  BP: 125/89 130/88  Pulse: 78 77  Resp:  18  Temp: 36.7 C   SpO2: 99% 100%    Last Pain:  Vitals:   01/21/17 2345  TempSrc:   PainSc: 6    Pain Goal: Patients Stated Pain Goal: 3 (01/21/17 0900)               [name] A.

## 2017-01-22 NOTE — Progress Notes (Signed)
Faculty Practice OB/GYN Attending Note   Subjective:  Patient is without complaints. No further concerning bleeding. Breastfeeding. Baby is doing well in NICU. No OOB yet.  Minimal pain.    Objective:   Patient Vitals for the past 24 hrs:  BP Temp Temp src Pulse Resp SpO2  01/22/17 0830 117/80 98.6 F (37 C) Oral 100 16 97 %  01/22/17 0800 121/84 - - - 15 -  01/22/17 0700 124/81 - - 92 16 100 %  01/22/17 0600 - - - 95 - 98 %  01/22/17 0530 - - - - - 100 %  01/22/17 0500 - - - 89 - 100 %  01/22/17 0430 - - - - 17 100 %  01/22/17 0420 - - - - 15 -  01/22/17 0410 - - - - 13 -  01/22/17 0400 130/82 99.5 F (37.5 C) Oral 92 14 100 %  01/22/17 0350 - - - - 14 -  01/22/17 0330 132/86 - - 94 13 100 %  01/22/17 0315 (!) 129/93 - - 92 14 100 %  01/22/17 0300 132/86 - - 93 15 100 %  01/22/17 0245 132/88 - - 94 15 100 %  01/22/17 0230 127/89 - - 94 14 100 %  01/22/17 0215 (!) 133/93 - - 98 17 100 %  01/22/17 0210 - - - - 17 -  01/22/17 0205 - - - - 19 -  01/22/17 0200 (!) 119/59 98 F (36.7 C) Oral 100 (!) 21 100 %  01/22/17 0155 127/86 - - (!) 101 19 100 %  01/22/17 0015 (!) 141/95 98 F (36.7 C) Oral 81 18 100 %  01/22/17 0000 (!) 134/92 98.1 F (36.7 C) Oral 73 18 100 %  01/21/17 2345 130/88 - - 77 18 100 %  01/21/17 2336 125/89 98.1 F (36.7 C) - 78 - 99 %  01/21/17 2138 - - - - - 97 %  01/21/17 2131 (!) 154/93 - - (!) 103 20 97 %  01/21/17 2126 - - - - - 99 %  01/21/17 2121 - - - - - 100 %  01/21/17 2116 - - - - - 97 %  01/21/17 2111 - - - - - 96 %  01/21/17 2106 - - - - - 100 %  01/21/17 2102 (!) 147/90 - - 99 (!) 22 100 %  01/21/17 2055 - - - - - 98 %  01/21/17 2051 - - - - - 99 %  01/21/17 2046 - - - - - 100 %  01/21/17 2041 - - - - - 100 %  01/21/17 2040 - - - - - 100 %  01/21/17 2037 - - - - - 97 %  01/21/17 2006 - - - - - 97 %  01/21/17 2002 (!) 147/82 - - 99 20 -  01/21/17 1932 (!) 157/92 99.2 F (37.3 C) Oral (!) 107 18 -  01/21/17 1900 (!) 156/94 - - (!)  103 - -  01/21/17 1843 - 99.3 F (37.4 C) Oral - - -  01/21/17 1830 (!) 149/91 - - (!) 103 - -  01/21/17 1800 (!) 148/94 98.3 F (36.8 C) Oral 98 16 -  01/21/17 1730 (!) 149/94 - - (!) 102 16 -  01/21/17 1700 136/79 - - 93 16 -  01/21/17 1630 (!) 146/98 99 F (37.2 C) - 95 16 -  01/21/17 1600 139/83 - - 95 18 -  01/21/17 1530 134/82 - - 99 18 -  01/21/17 1500 Marland Kitchen)  149/101 - - 97 16 -  01/21/17 1430 (!) 154/100 97.9 F (36.6 C) Oral (!) 102 18 -  01/21/17 1400 (!) 144/101 - - 85 18 -  01/21/17 1330 (!) 141/93 98.3 F (36.8 C) Oral 98 16 -  01/21/17 1300 (!) 144/86 - - 95 16 -  01/21/17 1230 (!) 155/103 - - (!) 103 16 -  01/21/17 1200 (!) 143/97 - - 92 18 -  01/21/17 1130 (!) 145/96 99.5 F (37.5 C) Oral 89 16 -  01/21/17 1100 135/76 - - 88 16 -  01/21/17 1030 126/76 - - 85 16 -  01/21/17 1000 (!) 139/93 - - 89 18 -  01/21/17 0930 (!) 143/96 - - 90 16 -    Intake/Output Summary (Last 24 hours) at 01/22/17 0918 Last data filed at 01/22/17 0830  Gross per 24 hour  Intake           2147.5 ml  Output             4170 ml  Net          -2022.5 ml    Gen: NAD HENT: Normocephalic, atraumatic Lungs: Normal respiratory effort Heart: Regular rate noted Abdomen: NT firm fundus, soft Pelvic: Small amount of blood on pad Ext: 2+ DTRs, no edema, no cyanosis, negative Homan's sign   CBC Latest Ref Rng & Units 01/22/2017 01/22/2017 01/21/2017  WBC 4.0 - 10.5 K/uL - 16.6(H) 20.0(H)  Hemoglobin 12.0 - 15.0 g/dL - 9.3(L) 12.0  Hematocrit 36.0 - 46.0 % - 26.8(L) 35.4(L)  Platelets 150 - 400 K/uL 131(L) 131(L) 131(L)   CMP Latest Ref Rng & Units 01/22/2017 01/21/2017 01/20/2017  Glucose 65 - 99 mg/dL 782(N) 562(Z) 308(M)  BUN 6 - 20 mg/dL Creatinine 0.44 - 1.00 mg/dL 5.78(I) 6.96(E) 9.52  Sodium 135 - 145 mmol/L 130(L) 130(L) 135  Potassium 3.5 - 5.1 mmol/L 4.8 4.4 4.4  Chloride 101 - 111 mmol/L 99(L) 100(L) 104  CO2 22 - 32 mmol/L 18(L) 15(L) 22  Calcium 8.9 - 10.3 mg/dL 7.6(L)  7.6(L) 7.4(L)  Total Protein 6.5 - 8.1 g/dL 5.1(L) 6.2(L) 6.3(L)  Total Bilirubin 0.3 - 1.2 mg/dL 0.3 0.8 0.6  Alkaline Phos 38 - 126 U/L 178(H) 226(H) 231(H)  AST 15 - 41 U/L ALT 14 - 54 U/L 11(L) 13(L) 14    Assessment & Plan:  26 y.o. G1P1001 POD#1 s/p cesarean section for FTP after 3 days of induction of labor for HELLP; labor course complicated by prolonged labor course, magnesium toxicity and Triple I, and postpartum course complicated by PPH due to LUS atony. *Anemia  Due to [facility] - Has not been OOB yet, will monitor for symptoms.  Labs pending. May need tranfusion.  She is typed+crossmatched for 3 units of pRBCs. *HELLP - Stable BP.  Therapeutic magnesium level at 0200, will not restart magnesium sulfate.  Cr was still elevated, will continue to monitor. Good UOP. Recheck labs in am. *Triple I - No further s/sx infection for now *Routine postpartum care.    Jaynie Collins, MD, FACOG Attending Obstetrician & Gynecologist Faculty Practice, [facility]

## 2017-01-22 NOTE — Lactation Note (Addendum)
This note was copied from a baby's chart. Lactation Consultation Note  Patient Name: Michelle Schultz [id number] Date: 01/22/2017 Reason for consult: Initial assessment;Term;NICU baby;1st time breastfeeding;Primapara;Other (Comment) (PPH 1800 cc EBL)   Initial consult with first time mom of 10 hour old NICU infant. Mom with PPH of 1800 cc and on MgSO4.   Mom with soft compressible breasts and everted nipples at rest that flatten with areolar compression. Mom reports + breast changes with pregnancy.   Mom is excited to begin pumping for her infant. Mom was taught to hand express and was able to obtain several gtts of colostrum. Mom was set up with DEBP and began pumping on Initiate setting. Enc mom to pump every 2-3 hours for 15 minutes on Initiate setting followed by hand expression. Parents were shown how to assemble, disassemble and clean pump parts. Enc mom to rest between feedings. Enc mom to pump after visiting infant in the NICU.   Providing Breast milk for Your Baby in NICU Booklet given, reviewed pumping schedule, what to expect with pumping, milk coming to volume and breast milk handling and storage for NICU infant. BF Resources handout and LC Brochure given, mom informed of IP Services, mom without questions/concerns at this time. Enc parents to call out for assistance as needed.    Maternal Data Formula Feeding for Exclusion: No Has patient been taught Hand Expression?: Yes Does the patient have breastfeeding experience prior to this delivery?: No  Feeding    LATCH Score                   Interventions Interventions: Hand express  Lactation Tools Discussed/Used Pump Review: Setup, frequency, and cleaning;Milk Storage Initiated by:: Noralee Stain, RN, IBCLC Date initiated:: 01/22/17   Consult Status Consult Status: Follow-up Date: 01/23/17 Follow-up type: In-patient    Michelle Schultz 01/22/2017, 8:39 AM

## 2017-01-22 NOTE — Progress Notes (Signed)
Faculty Practice OB/GYN Attending Note   Subjective:  Called to evaluate patient with increased bleeding per vagina after transfer to postpartum unit. Patient is mentating well, no other concerns. Minimal pain.    Objective:  Blood pressure 119/59, pulse 81, temperature 98.1 F (36.7 C), temperature source Oral, resp. rate 18, height  (1.575 m), weight 189 lb (85.7 kg), last menstrual period 04/11/2016, SpO2 100 %, unknown if currently breastfeeding. Gen: NAD HENT: Normocephalic, atraumatic Lungs: Normal respiratory effort Heart: Regular rate noted Abdomen: NT firm fundus, soft Pelvic: Significant clots expressed from lower uterine segment on bimanual exam (770 ml after measurement) Ext: 2+ DTRs, no edema, no cyanosis, negative Homan's sign  Assessment & Plan:  26 y.o. G1P1001 s/p cesarean section for FTP after 3 days of induction of labor and Triple I, now with PPH due to LUS atony.  Bimanual exam evacuated 770 ml from LUS; total EBL including intraoperative blood loss is now 1900 ml.  Methergine 0.2 mg IM  X 2 doses (20 minutes apart) and Cytotec 1000 mg PR x 1 placed; pitocin IV infusion already infusing. Will check CBC, CMET, DIC panel; she is also typed+crossmatched for 3 units of pRBCs.  Will continue close observation.  Jaynie Collins, MD, FACOG Attending Obstetrician & Gynecologist Faculty Practice, [facility]

## 2017-01-22 NOTE — Anesthesia Postprocedure Evaluation (Signed)
Anesthesia Post Note  Patient: Chartered loss adjuster  Procedure(s) Performed: Procedure(s) (LRB): CESAREAN SECTION (N/A)     Patient location during evaluation: Mother Baby Anesthesia Type: Epidural Level of consciousness: awake and alert, oriented and patient cooperative Pain management: pain level controlled Vital Signs Assessment: post-procedure vital signs reviewed and stable Respiratory status: spontaneous breathing Cardiovascular status: stable Postop Assessment: no headache, epidural receding, patient able to bend at knees and no signs of nausea or vomiting Anesthetic complications: no Comments: Pain score 4.    Last Vitals:  Vitals:   01/22/17 0930 01/22/17 1200  BP: 121/81 120/76  Pulse: (!) 104 99  Resp: 18 18  Temp:  37.2 C  SpO2: 95% 97%    Last Pain:  Vitals:   01/22/17 1212  TempSrc:   PainSc: 4    Pain Goal: Patients Stated Pain Goal: 3 (01/22/17 1212)               Merrilyn Puma

## 2017-01-22 NOTE — Progress Notes (Signed)
4 RN's @ bedside to assess pt and proceed with new orders given from MD

## 2017-01-23 DIAGNOSIS — O98919 Unspecified maternal infectious and parasitic disease complicating pregnancy, unspecified trimester: Secondary | ICD-10-CM | POA: Diagnosis not present

## 2017-01-23 DIAGNOSIS — D62 Acute posthemorrhagic anemia: Secondary | ICD-10-CM | POA: Diagnosis not present

## 2017-01-23 LAB — COMPREHENSIVE METABOLIC PANEL
ALK PHOS: 120 U/L (ref 38–126)
ALT: 10 U/L — AB (ref 14–54)
AST: 18 U/L (ref 15–41)
Albumin: 2 g/dL — ABNORMAL LOW (ref 3.5–5.0)
Anion gap: 5 (ref 5–15)
BILIRUBIN TOTAL: 0.3 mg/dL (ref 0.3–1.2)
BUN: 24 mg/dL — ABNORMAL HIGH (ref 6–20)
CALCIUM: 7.7 mg/dL — AB (ref 8.9–10.3)
CO2: 26 mmol/L (ref 22–32)
CREATININE: 1.42 mg/dL — AB (ref 0.44–1.00)
Chloride: 105 mmol/L (ref 101–111)
GFR, EST AFRICAN AMERICAN: 58 mL/min — AB (ref >60)
GFR, EST NON AFRICAN AMERICAN: 50 mL/min — AB (ref >60)
Glucose, Bld: 92 mg/dL (ref 65–99)
Potassium: 4.7 mmol/L (ref 3.5–5.1)
Sodium: 136 mmol/L (ref 135–145)
TOTAL PROTEIN: 5 g/dL — AB (ref 6.5–8.1)

## 2017-01-23 LAB — CBC
HCT: 19.4 % — ABNORMAL LOW (ref 36.0–46.0)
HEMOGLOBIN: 6.7 g/dL — AB (ref 12.0–15.0)
MCH: 30.3 pg (ref 26.0–34.0)
MCHC: 34.5 g/dL (ref 30.0–36.0)
MCV: 87.8 fL (ref 78.0–100.0)
Platelets: 133 10*3/uL — ABNORMAL LOW (ref 150–400)
RBC: 2.21 MIL/uL — AB (ref 3.87–5.11)
RDW: 14.4 % (ref 11.5–15.5)
WBC: 10.6 10*3/uL — ABNORMAL HIGH (ref 4.0–10.5)

## 2017-01-23 LAB — MAGNESIUM: MAGNESIUM: 3.6 mg/dL — AB (ref 1.7–2.4)

## 2017-01-23 MED ORDER — DIPHENHYDRAMINE HCL 25 MG PO CAPS
25.0000 mg | ORAL_CAPSULE | Freq: Once | ORAL | Status: AC
  Administered 2017-01-23: 25 mg via ORAL
  Filled 2017-01-23: qty 1

## 2017-01-23 MED ORDER — ACETAMINOPHEN 500 MG PO TABS
1000.0000 mg | ORAL_TABLET | Freq: Once | ORAL | Status: AC
  Administered 2017-01-23: 1000 mg via ORAL
  Filled 2017-01-23: qty 2

## 2017-01-23 MED ORDER — ACETAMINOPHEN 500 MG PO TABS
1000.0000 mg | ORAL_TABLET | Freq: Four times a day (QID) | ORAL | Status: DC | PRN
  Administered 2017-01-24: 1000 mg via ORAL
  Filled 2017-01-23 (×2): qty 2

## 2017-01-23 MED ORDER — FUROSEMIDE 10 MG/ML IJ SOLN
20.0000 mg | Freq: Once | INTRAMUSCULAR | Status: AC
  Administered 2017-01-23: 20 mg via INTRAVENOUS
  Filled 2017-01-23: qty 2

## 2017-01-23 MED ORDER — SODIUM CHLORIDE 0.9 % IV SOLN
Freq: Once | INTRAVENOUS | Status: AC
  Administered 2017-01-23 (×2): via INTRAVENOUS

## 2017-01-23 MED ORDER — AMOXICILLIN-POT CLAVULANATE 875-125 MG PO TABS
1.0000 | ORAL_TABLET | Freq: Two times a day (BID) | ORAL | Status: DC
  Administered 2017-01-23 – 2017-01-24 (×3): 1 via ORAL
  Filled 2017-01-23 (×3): qty 1

## 2017-01-23 NOTE — Progress Notes (Signed)
Discussed recommendation for transfusion for patient given hemoglobin of 6.7 (preoperative hemoglobin was 12).  She does feel weak but denies any other symptoms of anemia. Emphasized that is will help with overall recovery, wound healing etc.  She agrees with this plan. Ordered for 3 units of pBRCs, will get premedication with tylenol and Lasix after 2nd unit.  Of note, her RN brought it my attention that she had low grade temperatures of 99.9 and 100.1 recently.  Given her Triple I and  postpartum bimanual exams, she is at risk for continued intrauterine infection or inflammation.  Will start Augmentin DS twice a day and give Tylenol.  Transfusion to start later in this afternoon if she remains stable.  Of note, her other labs are normalizing (Cr now 1.49, Mg 3.6, plts 133k). Baby improving in NICU, his magnesium levels are normalizing too.  BP stable.  Will continue routine postpartum care.  Jaynie Collins, MD, FACOG Attending Obstetrician & Gynecologist, [facility] for [employer], [facility]

## 2017-01-23 NOTE — Progress Notes (Addendum)
Subjective: Postpartum Day 2: Cesarean Delivery complicated by hypermagnesemia preoperative, and uterine lower segment atony with EBL 1800 cc , including the PP hemorrhage of 800 cc clots yesterday morning Patient reports incisional pain, tolerating PO and no problems voiding.    s;he is ambulatory , out in hall, and without dizziness, or fatigue. Her only complaint is incision pain. Objective: Vital signs in last 24 hours: Temp:  [97.8 F (36.6 C)-100.1 F (37.8 C)] 100.1 F (37.8 C) (09/24 0500) Pulse Rate:  [82-106] 94 (09/24 0412) Resp:  [15-18] 15 (09/24 0412) BP: (117-137)/(64-84) 122/64 (09/24 0412) SpO2:  [95 %-98 %] 98 % (09/24 0412)  Physical Exam:  General: alert, cooperative and no distress Lochia: appropriate Uterine Fundus: firm Incision: dressing in place , no bleeding DVT Evaluation: No evidence of DVT seen on physical exam. CBC Latest Ref Rng & Units 01/23/2017 01/22/2017 01/22/2017  WBC 4.0 - 10.5 K/uL 10.6(H) 12.3(H) -  Hemoglobin 12.0 - 15.0 g/dL 6.7(LL) 8.1(L) -  Hematocrit 36.0 - 46.0 % 19.4(L) 23.5(L) -  Platelets 150 - 400 K/uL 133(L) 117(L) 131(L)     Recent Labs  01/22/17 0802 01/23/17 0510  HGB 8.1* 6.7*  HCT 23.5* 19.4*    Assessment/Plan: Status post Cesarean section. Postoperative course complicated by surgical anemia, being tolerated by the patient.   Continue current care, baby still in NICU, will be considered for rooming in this am depending on Mag level of baby.Tilda Burrow 01/23/2017, 7:40 AM

## 2017-01-23 NOTE — Progress Notes (Signed)
Patient screened out for psychosocial assessment since none of the following apply:  Psychosocial stressors documented in mother or baby's chart  Gestation less than 32 weeks  Code at delivery   Infant with anomalies Please contact the Clinical Social Worker if specific needs arise, or by MOB's request.   Arval Brandstetter Boyd-Gilyard, MSW, LCSW Clinical Social Work [phone]  

## 2017-01-23 NOTE — Lactation Note (Signed)
This note was copied from a baby's chart. Lactation Consultation Note  Patient Name: Michelle Schultz [id number] Date: 01/23/2017 Reason for consult: Follow-up assessment;NICU baby;Primapara;Other (Comment) (PPH EBL 1800 cc)   Follow up with mom at infant's bedside. Mom holding infant currently. Mom reports she is pumping and hand expressing and she is not getting colostrum. Enc mom to be patient and to continue to pump and hand express every 2-3 hours. Mom reports she is to receive blood transfusion this afternoon.    Maternal Data Formula Feeding for Exclusion: No Has patient been taught Hand Expression?: Yes Does the patient have breastfeeding experience prior to this delivery?: No  Feeding    LATCH Score                   Interventions    Lactation Tools Discussed/Used Pump Review: Setup, frequency, and cleaning;Milk Storage Initiated by:: Reviewed and encouraged every 2-3 hours   Consult Status Consult Status: Follow-up Date: 01/24/17 Follow-up type: In-patient    Silas Flood Hice 01/23/2017, 11:41 AM

## 2017-01-24 ENCOUNTER — Telehealth: Payer: Self-pay | Admitting: Radiology

## 2017-01-24 ENCOUNTER — Ambulatory Visit: Payer: Self-pay

## 2017-01-24 LAB — BPAM RBC
BLOOD PRODUCT EXPIRATION DATE: 201809272359
BLOOD PRODUCT EXPIRATION DATE: 201809292359
Blood Product Expiration Date: 201809252359
Blood Product Expiration Date: 201809252359
Blood Product Expiration Date: 201809272359
ISSUE DATE / TIME: 201809241522
ISSUE DATE / TIME: 201809241620
ISSUE DATE / TIME: 201809242045
ISSUE DATE / TIME: 201809242313
ISSUE DATE / TIME: 201809242342
UNIT TYPE AND RH: 600
UNIT TYPE AND RH: 6200
UNIT TYPE AND RH: 6200
UNIT TYPE AND RH: 6200
Unit Type and Rh: 6200

## 2017-01-24 LAB — TYPE AND SCREEN
ABO/RH(D): A POS
Antibody Screen: NEGATIVE
UNIT DIVISION: 0
UNIT DIVISION: 0
UNIT DIVISION: 0
Unit division: 0
Unit division: 0

## 2017-01-24 LAB — COMPREHENSIVE METABOLIC PANEL
ALK PHOS: 112 U/L (ref 38–126)
ALT: 20 U/L (ref 14–54)
ANION GAP: 5 (ref 5–15)
AST: 37 U/L (ref 15–41)
Albumin: 2.1 g/dL — ABNORMAL LOW (ref 3.5–5.0)
BUN: 22 mg/dL — ABNORMAL HIGH (ref 6–20)
CO2: 26 mmol/L (ref 22–32)
Calcium: 7.9 mg/dL — ABNORMAL LOW (ref 8.9–10.3)
Chloride: 108 mmol/L (ref 101–111)
Creatinine, Ser: 1.11 mg/dL — ABNORMAL HIGH (ref 0.44–1.00)
Glucose, Bld: 86 mg/dL (ref 65–99)
Potassium: 4.3 mmol/L (ref 3.5–5.1)
SODIUM: 139 mmol/L (ref 135–145)
TOTAL PROTEIN: 5.2 g/dL — AB (ref 6.5–8.1)
Total Bilirubin: 1.2 mg/dL (ref 0.3–1.2)

## 2017-01-24 LAB — CBC
HCT: 27.2 % — ABNORMAL LOW (ref 36.0–46.0)
HEMOGLOBIN: 9.7 g/dL — AB (ref 12.0–15.0)
MCH: 30.2 pg (ref 26.0–34.0)
MCHC: 35.7 g/dL (ref 30.0–36.0)
MCV: 84.7 fL (ref 78.0–100.0)
PLATELETS: 148 10*3/uL — AB (ref 150–400)
RBC: 3.21 MIL/uL — AB (ref 3.87–5.11)
RDW: 14.9 % (ref 11.5–15.5)
WBC: 13.1 10*3/uL — ABNORMAL HIGH (ref 4.0–10.5)

## 2017-01-24 LAB — MAGNESIUM: MAGNESIUM: 2.2 mg/dL (ref 1.7–2.4)

## 2017-01-24 MED ORDER — AMOXICILLIN-POT CLAVULANATE 875-125 MG PO TABS: 1.0000 | ORAL_TABLET | Freq: Two times a day (BID) | ORAL | 0 refills | Status: DC

## 2017-01-24 MED ORDER — FERROUS SULFATE 325 (65 FE) MG PO TABS: 325.0000 mg | ORAL_TABLET | Freq: Two times a day (BID) | ORAL | 3 refills | Status: DC

## 2017-01-24 MED ORDER — OXYCODONE-ACETAMINOPHEN 5-325 MG PO TABS: 1.0000 | ORAL_TABLET | Freq: Four times a day (QID) | ORAL | 0 refills | Status: DC | PRN

## 2017-01-24 MED ORDER — DOCUSATE SODIUM 100 MG PO CAPS
100.0000 mg | ORAL_CAPSULE | Freq: Two times a day (BID) | ORAL | Status: DC | PRN
  Administered 2017-01-24: 100 mg via ORAL
  Filled 2017-01-24: qty 1

## 2017-01-24 MED ORDER — DOCUSATE SODIUM 100 MG PO CAPS: 100.0000 mg | ORAL_CAPSULE | Freq: Two times a day (BID) | ORAL | 2 refills | Status: DC | PRN

## 2017-01-24 NOTE — Progress Notes (Signed)
Postpartum Day 3: Cesarean Delivery for FTP after failed IOL for HELLP complicated by preoperative hypermagnesemia, Triple I and PPH due to lower uterine segment atony with EBL 1900 ml   Subjective: Patient reports mild incisional pain, tolerating PO and no problems voiding.  No flatus yet.  Tolerated blood transfusion overnight ( 3 units pRBCs). Np symptoms of anemia.   Baby doing well in NICU.  Objective: Vital signs in last 24 hours: Temp:  [97.9 F (36.6 C)-98.7 F (37.1 C)] 98.1 F (36.7 C) (09/25 0800) Pulse Rate:  [53-93] 53 (09/25 0800) Resp:  [16-18] 18 (09/25 0800) BP: (118-143)/(74-88) 122/79 (09/25 0800) SpO2:  [96 %-99 %] 97 % (09/25 0800)   Patient Vitals for the past 24 hrs:  BP Temp Temp src Pulse Resp SpO2  01/24/17 0800 122/79 98.1 F (36.7 C) Oral (!) 53 18 97 %  01/24/17 0616 131/77 98.1 F (36.7 C) Oral 65 18 96 %  01/24/17 0135 122/78 97.9 F (36.6 C) Oral 71 18 -  01/23/17 2345 (!) 143/88 98.4 F (36.9 C) - 75 18 99 %  01/23/17 2315 137/78 98.7 F (37.1 C) Oral 82 18 98 %  01/23/17 2120 140/82 98.6 F (37 C) Oral 89 18 -  01/23/17 2045 131/86 98.3 F (36.8 C) Oral 82 18 -  01/23/17 2036 131/86 98.3 F (36.8 C) Oral 82 18 99 %  01/23/17 1812 127/78 98.7 F (37.1 C) Oral 82 18 98 %  01/23/17 1558 127/78 98.5 F (36.9 C) Oral 88 16 -  01/23/17 1515 130/74 98.3 F (36.8 C) Oral 93 16 -  01/23/17 1329 118/82 98.5 F (36.9 C) Oral 79 18 99 %    Intake/Output Summary (Last 24 hours) at 01/24/17 0850 Last data filed at 01/24/17 0230  Gross per 24 hour  Intake             1005 ml  Output             1600 ml  Net             -595 ml    Physical Exam:  General: alert, cooperative and no distress Lochia: appropriate Uterine Fundus: firm Incision: dressing in place , no bleeding DVT Evaluation: No evidence of DVT seen on physical exam. CBC Latest Ref Rng & Units 01/24/2017 01/23/2017 01/22/2017  WBC 4.0 - 10.5 K/uL 13.1(H) 10.6(H) 12.3(H)  Hemoglobin  12.0 - 15.0 g/dL 1.3(Y) 6.7(LL) 8.1(L)  Hematocrit 36.0 - 46.0 % 27.2(L) 19.4(L) 23.5(L)  Platelets 150 - 400 K/uL 148(L) 133(L) 117(L)    CMP Latest Ref Rng & Units 01/24/2017 01/23/2017 01/22/2017  Glucose 65 - 99 mg/dL 86 92 94  BUN 6 - 20 mg/dL 86(V) 78(I) 13  Creatinine 0.44 - 1.00 mg/dL 6.96(E) 9.52(W) 4.13(K)  Sodium 135 - 145 mmol/L 139 136 132(L)  Potassium 3.5 - 5.1 mmol/L 4.3 4.7 4.9  Chloride 101 - 111 mmol/L 108 105 101  CO2 22 - 32 mmol/L Calcium 8.9 - 10.3 mg/dL 7.9(L) 7.7(L) 7.4(L)  Total Protein 6.5 - 8.1 g/dL 5.2(L) 5.0(L) 4.8(L)  Total Bilirubin 0.3 - 1.2 mg/dL 1.2 0.3 0.3  Alkaline Phos 38 - 126 U/L 112 120 153(H)  AST 15 - 41 U/L 37 18 23  ALT 14 - 54 U/L 20 10(L) 9(L)  Normal magnesium levels  Assessment/Plan: Stabilizing well Continue oral iron therapy for anemia Continue Augmentin for Triple I GI motility agents ordered to help with flatus Normalizing labs Encourage  more OOB Plan for discharge tomorrow morning.   Jaynie Collins, MD 01/24/2017, 8:48 AM

## 2017-01-24 NOTE — Lactation Note (Signed)
This note was copied from a baby's chart. Lactation Consultation Note  Patient Name: Michelle Schultz [id number] Date: 01/24/2017 Reason for consult: Follow-up assessment   Baby 70 hours old and been in NICU and now rooming in for discharge tomorrow. Mother has not yet latched baby and she has not been pumping in more than 24 hours. Reviewed hand expression and drops easily expressed. Suggest she hand express before and often with latching and pumping. Baby latched easily in football hold and both breasts. Reviewed basics. Recommend mother post pump 3-4 times per day with DEBP and give volume back to baby. Reviewed milk storage. Reviewed engorgement care and monitoring voids/stools. After feeding baby began cueing again. Mom encouraged to feed baby 8-12 times/24 hours and with feeding cues.  Mother relatched baby in football.  Discussed cluster feeding and praised parents for efforts.   Maternal Data Has patient been taught Hand Expression?: Yes  Feeding Feeding Type: Breast Fed Length of feed: 30 min  LATCH Score Latch: Grasps breast easily, tongue down, lips flanged, rhythmical sucking.  Audible Swallowing: A few with stimulation  Type of Nipple: Everted at rest and after stimulation  Comfort (Breast/Nipple): Soft / non-tender  Hold (Positioning): Assistance needed to correctly position infant at breast and maintain latch.  LATCH Score: 8  Interventions Interventions: Breast feeding basics reviewed;Assisted with latch;Skin to skin;Breast massage;Hand express;Breast compression;Adjust position;Support pillows;Position options;Expressed milk;DEBP  Lactation Tools Discussed/Used     Consult Status Consult Status: Complete    Hardie Pulley 01/24/2017, 9:21 PM

## 2017-01-24 NOTE — Discharge Instructions (Signed)

## 2017-01-24 NOTE — Plan of Care (Signed)
Problem: Education: Goal: Knowledge of condition will improve Outcome: Progressing Pt aware of condition and signs or symptoms to watch for and to notify care givers.   Problem: Coping: Goal: Ability to cope will improve Outcome: Progressing Pt does not exhibit anxiety related to care she is receiving. She is encouraged to ask questions to help her understand rationale for tests and procedures. Goal: Ability to identify and utilize available resources and services will improve Outcome: Progressing Pt working with NICU in understanding care of infant son and participating in breast pumping to provide breast milk for him as well.  Problem: Life Cycle: Goal: Risk for postpartum hemorrhage will decrease Outcome: Progressing Pt lochia flow has returned to expected amounts on post op day 2-3. Goal: Chance of risk for complications during the postpartum period will decrease Outcome: Progressing Pt's condition has improved rapidly with rest, activity as tolerated and medical interventions, (ie. transfusion of three units of PRBC's).

## 2017-01-24 NOTE — Telephone Encounter (Signed)
Left message on cell phone voicemail to call [id number] to schedule appointment with Dr Macon Large for BP check, 1 Wk ROB and PP appointment

## 2017-01-24 NOTE — Discharge Summary (Signed)
OB Discharge Summary     Patient Name: Michelle Schultz DOB: 03-13-1991 MRN: [id number]  Date of admission: 01/19/2017 Delivering MD: Jaynie Collins A   Date of discharge: 01/24/2017  Admitting diagnosis: INDUCTION Intrauterine pregnancy: [redacted]w[redacted]d     Secondary diagnosis:  Principal Problem:   S/P cesarean section for FTP Active Problems:   Rubella non-immune status, antepartum   HELLP syndrome (HELLP), third trimester   High serum magnesium   PPH (postpartum hemorrhage)   Postoperative anemia due to acute blood loss   Intrauterine infection  Additional problems:As above     Discharge diagnosis: Term Pregnancy Delivered, Preeclampsia (severe), Anemia, PPH and Intrauterine infection.                                                                                                Post partum procedures:blood transfusion with 3 units of pRBCs  Augmentation: AROM, Pitocin, Cytotec and Foley Balloon  Complications: Intrauterine Inflammation or infection (Chorioamniotis) and Hemorrhage>1060mL and hypermagnesemia.  Hospital course:  Induction of Labor With Cesarean Section  26 y.o. yo G1P1001 at [redacted]w[redacted]d was admitted to the hospital 01/19/2017 for induction of labor; was a transfer from [facility] with concern for HELLP. Patient had a labor course significant for prolonged labor course (3 days) complicated by magnesium toxicity treated with Lasix and calcium gluconate and intrauterine infection treated with antibiotics. The patient went for cesarean section due to Arrest of Descent and failed attempt of operative vaginal delivery, and delivered a Viable infant.  Details of operation can be found in separate operative; she had increased blood loss during surgery.  After surgery, she had PPH and lost an estimated 1900 ml of blood including intraoperative  Loss.  She did receive 3 units of pRBCs.  Her Cr, which was elevated after Magnesium toxicity and HELLP, normalized over time.  BP remain stable, no  medications were needed. Platelets were stable at 131K. Post transfusion hemoglobin was 9.7, started on oral iron.  Patient was treated with Augmentin for her intrauterine infection. On day of discharge, she was ambulating, tolerating a regular diet, passing flatus, and urinating well. Had no other concerns. Deemed to be stable for outpatient followup.  Patient was discharged home in stable condition on 01/24/17.                                    Physical exam  Vitals:   01/24/17 0135 01/24/17 0616 01/24/17 0800 01/24/17 1200  BP: 122/78 131/77 122/79 130/81  Pulse: 71 65 (!) 53 62  Resp: Temp: 97.9 F (36.6 C) 98.1 F (36.7 C) 98.1 F (36.7 C) 97.9 F (36.6 C)  TempSrc: Oral Oral Oral Oral  SpO2:  96% 97% 99%  Weight:      Height:       General: alert and no distress Lochia: appropriate Uterine Fundus: firm Incision: Healing well with no significant drainage, No significant erythema, Dressing is clean, dry, and intact DVT Evaluation: No evidence of DVT seen on physical exam. Negative  Homan's sign. No cords or calf tenderness. No significant calf/ankle edema. Labs:  CBC Latest Ref Rng & Units 01/24/2017 01/23/2017 01/22/2017  WBC 4.0 - 10.5 K/uL 13.1(H) 10.6(H) 12.3(H)  Hemoglobin 12.0 - 15.0 g/dL 8.1(X) 6.7(LL) 8.1(L)  Hematocrit 36.0 - 46.0 % 27.2(L) 19.4(L) 23.5(L)  Platelets 150 - 400 K/uL 148(L) 133(L) 117(L)     CMP Latest Ref Rng & Units 01/24/2017 01/23/2017 01/22/2017  Glucose 65 - 99 mg/dL 86 92 94  BUN 6 - 20 mg/dL 91(Y) 78(G) 13  Creatinine 0.44 - 1.00 mg/dL 9.56(O) 1.30(Q) 6.57(Q)  Sodium 135 - 145 mmol/L 139 136 132(L)  Potassium 3.5 - 5.1 mmol/L 4.3 4.7 4.9  Chloride 101 - 111 mmol/L 108 105 101  CO2 22 - 32 mmol/L Calcium 8.9 - 10.3 mg/dL 7.9(L) 7.7(L) 7.4(L)  Total Protein 6.5 - 8.1 g/dL 5.2(L) 5.0(L) 4.8(L)  Total Bilirubin 0.3 - 1.2 mg/dL 1.2 0.3 0.3  Alkaline Phos 38 - 126 U/L 112 120 153(H)  AST 15 - 41 U/L 37 18 23  ALT 14 - 54  U/L 20 10(L) 9(L)    Discharge instruction: per After Visit Summary and "Baby and Me Booklet".  After visit meds:  Allergies as of 01/24/2017   No Known Allergies     Medication List    TAKE these medications   amoxicillin-clavulanate 875-125 MG tablet Commonly known as:  AUGMENTIN Take 1 tablet by mouth every 12 (twelve) hours.   docusate sodium 100 MG capsule Commonly known as:  COLACE Take 1 capsule (100 mg total) by mouth 2 (two) times daily as needed.   ferrous sulfate 325 (65 FE) MG tablet Take 1 tablet (325 mg total) by mouth 2 (two) times daily with a meal.   oxyCODONE-acetaminophen 5-325 MG tablet Commonly known as:  PERCOCET/ROXICET Take 1-2 tablets by mouth every 6 (six) hours as needed for moderate pain or severe pain (pain scale 4-7).   prenatal multivitamin Tabs tablet Take 1 tablet by mouth daily at 12 noon.            Discharge Care Instructions        Start     Ordered   01/24/17 0000  amoxicillin-clavulanate (AUGMENTIN) 875-125 MG tablet  Every 12 hours     01/24/17 0844   01/24/17 0000  ferrous sulfate 325 (65 FE) MG tablet  2 times daily with meals     01/24/17 0844   01/24/17 0000  oxyCODONE-acetaminophen (PERCOCET/ROXICET) 5-325 MG tablet  Every 6 hours PRN     01/24/17 0844   01/24/17 0000  docusate sodium (COLACE) 100 MG capsule  2 times daily PRN     01/24/17 0844      Diet: routine diet  Activity: Advance as tolerated. Pelvic rest for 6 weeks.  Follow up Appt:Future Appointments Date Time Provider Department Center  01/31/2017 1:15 PM [facility] NURSE [facility]  02/07/2017 9:45 AM Shaden Higley, Jethro Bastos, MD [facility]  03/02/2017 9:00 AM Zackery Brine, Jethro Bastos, MD [facility]   Follow up Visit:No Follow-up on file.  Postpartum contraception: Progesterone only pills or Depo Provera  Newborn Data: Live born female  Birth Weight: 8 lb 13.1 oz (4000 g) APGAR: 5, 7  Baby Feeding: Breast Disposition:rooming  in   01/24/2017 Jaynie Collins, MD

## 2017-01-31 ENCOUNTER — Ambulatory Visit: Payer: 59

## 2017-01-31 VITALS — BP 120/84 | HR 80

## 2017-01-31 DIAGNOSIS — Z0130 Encounter for examination of blood pressure without abnormal findings: Secondary | ICD-10-CM

## 2017-01-31 NOTE — Progress Notes (Signed)
Patient presented to the office today for blood pressure check. Patient reports no visual problems, headache or dizziness at this time. Patient blood pressure is 120/84 P 80. Patient postpartum appointment has been schedule.

## 2017-02-01 NOTE — Progress Notes (Signed)
Patient seen and assessed by nursing staff.  Agree with documentation and plan.  

## 2017-02-07 ENCOUNTER — Encounter: Payer: 59 | Admitting: Obstetrics & Gynecology

## 2017-02-07 ENCOUNTER — Ambulatory Visit (INDEPENDENT_AMBULATORY_CARE_PROVIDER_SITE_OTHER): Payer: 59 | Admitting: Obstetrics & Gynecology

## 2017-02-07 VITALS — BP 116/78

## 2017-02-07 DIAGNOSIS — Z5189 Encounter for other specified aftercare: Secondary | ICD-10-CM

## 2017-02-07 NOTE — Progress Notes (Signed)
Subjective:     Michelle Schultz is a 26 y.o. G73P1001 female who presents to the clinic 2 weeks status post cesarean section in the setting of HEELP at term; postpartum course complicated by Triple I, hypermagnesia and PPH. Eating a regular diet without difficulty. The patient is not having any pain.  The following portions of the patient's history were reviewed and updated as appropriate: allergies, current medications, past family history, past medical history, past social history, past surgical history and problem list.  Review of Systems Pertinent items noted in HPI and remainder of comprehensive ROS otherwise negative.    Objective:    BP 116/78  General:  alert and no distress  Abdomen: soft, bowel sounds active, non-tender  Incision:   healing well, no drainage, no erythema, no hernia, no seroma, no swelling, no dehiscence, incision well approximated     Assessment:    Doing well postoperatively.   Plan:   1. Continue any current medications. 2. Wound care discussed. 3. Activity restrictions: no lifting more than 15 pounds 4. Anticipated return to work: 4 weeks. 5. Follow up for scheduled PP visit.  Jaynie Collins, MD, FACOG Attending Obstetrician & Gynecologist, [facility] for [employer], [facility]

## 2017-02-07 NOTE — Patient Instructions (Signed)
Return to clinic for any scheduled appointments or for any gynecologic concerns as needed.   

## 2017-03-02 ENCOUNTER — Encounter: Payer: Self-pay | Admitting: Obstetrics & Gynecology

## 2017-03-02 ENCOUNTER — Ambulatory Visit (INDEPENDENT_AMBULATORY_CARE_PROVIDER_SITE_OTHER): Payer: 59 | Admitting: Obstetrics & Gynecology

## 2017-03-02 ENCOUNTER — Encounter: Payer: Self-pay | Admitting: Radiology

## 2017-03-02 NOTE — Progress Notes (Signed)
Post Partum Exam  Michelle BelliniMegan Schultz is a 26 y.o. 511P1001 female who presents for a postpartum visit. She is 6 weeks postpartum following a low cervical transverse Cesarean section. I have fully reviewed the prenatal and intrapartum course; see previous notes for complicated prenatal and intrapartum course. The delivery was at 40.5 gestational weeks.  Anesthesia: epidural. Postpartum course has been unremarkable. Baby's course has been unremarkable. Baby is feeding by bottle - Similac Non-Soy. Bleeding no bleeding. Bowel function is normal. Bladder function is normal. Patient is sexually active.  Stopped having lochia last week and had one episode of unprotected intercourse. Desired contraception method is Depo-Provera injections. Postpartum depression screening: Score = 1.  The following portions of the patient's history were reviewed and updated as appropriate: allergies, current medications, past family history, past medical history, past social history, past surgical history and problem list.  Last pap smear was 09/20/2016 and was normal.  Review of Systems Pertinent items noted in HPI and remainder of comprehensive ROS otherwise negative.    Objective:  Blood pressure 117/79, pulse 70, height 5\' 1"  (1.549 m), weight 162 lb (73.5 kg), not currently breastfeeding.  General:  alert and no distress   Breasts:  deferred  Lungs: clear to auscultation bilaterally  Heart:  regular rate and rhythm  Abdomen: soft, non-tender; bowel sounds normal; no masses,  no organomegaly. Incision C/D/I, no erythema, no drainage        UPT: Negative  Assessment:   Normal postpartum exam. Pap smear not done at today's visit.   Plan:   1. Contraception: Depo-Provera injections.  Declined having this today, will use condoms. Will reutrn in 2 weeks for injection, no unprotected sex until then. Will repeat UPT then.    Michelle CollinsUGONNA  Michelle Tallman, MD, FACOG Attending Obstetrician & Gynecologist, [facility] for  [employer], [facility]

## 2017-03-02 NOTE — Patient Instructions (Signed)
Return to clinic for any scheduled appointments or for any gynecologic concerns as needed.   

## 2017-03-16 ENCOUNTER — Ambulatory Visit: Payer: 59

## 2017-03-16 ENCOUNTER — Other Ambulatory Visit: Payer: Self-pay | Admitting: *Deleted

## 2017-03-16 MED ORDER — NORETHIN ACE-ETH ESTRAD-FE 1-20 MG-MCG(24) PO TABS: 1.0000 | ORAL_TABLET | Freq: Every day | ORAL | 6 refills | Status: DC

## 2017-03-16 NOTE — Telephone Encounter (Signed)
Pt here for Depo injection but decided to switch to OCP. She has been on OCP before, LoEstrin, and had good luck with that. She requested OCP's sent to pharmacy instead of injection today. [facility] if this does not work.

## 2017-11-21 ENCOUNTER — Other Ambulatory Visit: Payer: Self-pay

## 2017-11-21 ENCOUNTER — Telehealth: Payer: Self-pay | Admitting: Obstetrics & Gynecology

## 2017-11-21 MED ORDER — NORETHIN ACE-ETH ESTRAD-FE 1-20 MG-MCG PO TABS: 1.0000 | ORAL_TABLET | Freq: Every day | ORAL | 11 refills | Status: DC

## 2017-11-21 NOTE — Telephone Encounter (Signed)
Insurance will not cover patient Michelle Schultz anymore patient would like to switch to June since it is cover at 100%. Ok per Dr.Pratt to call into pharmacy.

## 2017-11-21 NOTE — Telephone Encounter (Signed)
Pt called states [facility] in [place] charges [phone] for her birth control unless the doctor authorizes it. Pt states she has never had this problem before. Please contact pharmacy to provide what is needed.

## 2017-11-21 NOTE — Telephone Encounter (Signed)
Spoke with patient concerning birth control pills. She has a high deductible and this medications will cost her 188.00. I have ask patient to call her insurance company to get a list of preferred birth control pills they will cover at a 100 percent.

## 2018-02-06 ENCOUNTER — Ambulatory Visit: Payer: 59 | Admitting: Advanced Practice Midwife

## 2018-02-07 ENCOUNTER — Ambulatory Visit: Payer: 59 | Admitting: Family Medicine

## 2018-02-08 ENCOUNTER — Ambulatory Visit (INDEPENDENT_AMBULATORY_CARE_PROVIDER_SITE_OTHER): Payer: 59 | Admitting: Obstetrics and Gynecology

## 2018-02-08 VITALS — BP 139/87 | HR 94 | Wt 159.4 lb

## 2018-02-08 DIAGNOSIS — Z3041 Encounter for surveillance of contraceptive pills: Secondary | ICD-10-CM

## 2018-02-08 DIAGNOSIS — N921 Excessive and frequent menstruation with irregular cycle: Secondary | ICD-10-CM | POA: Diagnosis not present

## 2018-02-08 DIAGNOSIS — Z3202 Encounter for pregnancy test, result negative: Secondary | ICD-10-CM | POA: Diagnosis not present

## 2018-02-08 LAB — POCT URINE PREGNANCY: Preg Test, Ur: NEGATIVE

## 2018-02-08 NOTE — Progress Notes (Signed)
Obstetrics and Gynecology Visit Return Patient Evaluation  Appointment Date: 02/08/2018  Primary Care Provider: Patient, No Pcp Per  OBGYN Clinic: [facility]  Chief Complaint: BTB with OCPs  History of Present Illness:  Nevin Kozuch is a 27 y.o. G1P1 with the above CC. Patient was on Loestrin until having to switch to Junel in July of this due to insurance coverage reasons. Since being on Junel she's had BTB, irregular bleeding with it.   Review of Systems:  as noted in the History of Present Illness.  Medications:  Amelia Burgard had no medications administered during this visit. Current Outpatient Medications  Medication Sig Dispense Refill  . norethindrone-ethinyl estradiol (JUNEL FE 1/20) 1-20 MG-MCG tablet Take 1 tablet by mouth daily. 1 Package 11   No current facility-administered medications for this visit.     Allergies: has No Known Allergies.  Physical Exam:  BP 139/87   Pulse 94   Wt 159 lb 6.4 oz (72.3 kg)   SpO2 99%   BMI 30.12 kg/m  Body mass index is 30.12 kg/m. General appearance: Well nourished, well developed female in no acute distress.   Labs: UPT negative   Assessment: pt stable   Plan:  1. Surveillance for birth control, oral contraceptives - POCT urine pregnancy  2. Breakthrough bleeding on birth control pills Patient interested in potentially trying to get pregnant again in about another year or so. I told her we will try and contact her insurance to see what's covered and see about trying either another EE and norethindrone pill, another type of formulation altogether. I also d/w her re: patch and ring and she is interested in these as well.    RTC: PRN  Cornelia Copa MD Attending Center for [employer] [facility])

## 2018-02-12 ENCOUNTER — Other Ambulatory Visit: Payer: Self-pay | Admitting: Obstetrics and Gynecology

## 2018-02-12 MED ORDER — ETONOGESTREL-ETHINYL ESTRADIOL 0.12-0.015 MG/24HR VA RING: VAGINAL_RING | VAGINAL | 3 refills | Status: DC

## 2018-04-02 ENCOUNTER — Ambulatory Visit (INDEPENDENT_AMBULATORY_CARE_PROVIDER_SITE_OTHER): Payer: 59 | Admitting: *Deleted

## 2018-04-02 VITALS — BP 129/88 | HR 116

## 2018-04-02 DIAGNOSIS — Z3041 Encounter for surveillance of contraceptive pills: Secondary | ICD-10-CM

## 2018-04-02 NOTE — Progress Notes (Signed)
Subjective:  Michelle BelliniMegan Schultz is a 27 y.o. female here for BP check after starting the Nuva ring. Denies any issues or complaints at this time.    Objective:  BP 129/88 HR 116 Appearance alert, well appearing, and in no distress. General exam BP noted to be well controlled today in office.    Assessment:   Blood Pressure:WNL   Plan:  Follow up as needed.

## 2018-04-02 NOTE — Progress Notes (Signed)
I have reviewed the chart and agree with nursing staff's documentation of this patient's encounter.  [place] Bingharlie Anastaisa Wooding, MD 04/02/2018 11:44 AM

## 2019-10-16 ENCOUNTER — Ambulatory Visit: Payer: 59 | Admitting: Family Medicine

## 2019-10-31 ENCOUNTER — Ambulatory Visit: Payer: Self-pay | Admitting: Obstetrics & Gynecology

## 2020-01-14 ENCOUNTER — Other Ambulatory Visit: Payer: Self-pay

## 2020-01-14 ENCOUNTER — Encounter: Payer: Self-pay | Admitting: Obstetrics & Gynecology

## 2020-01-14 ENCOUNTER — Other Ambulatory Visit (HOSPITAL_COMMUNITY)
Admission: RE | Admit: 2020-01-14 | Discharge: 2020-01-14 | Disposition: A | Discharge status: Home / Self Care | Payer: No Typology Code available for payment source | Source: Ambulatory Visit | Attending: Obstetrics & Gynecology | Admitting: Obstetrics & Gynecology

## 2020-01-14 ENCOUNTER — Ambulatory Visit (INDEPENDENT_AMBULATORY_CARE_PROVIDER_SITE_OTHER): Payer: No Typology Code available for payment source | Admitting: Obstetrics & Gynecology

## 2020-01-14 VITALS — BP 143/87 | HR 114 | Ht 62.0 in | Wt 165.2 lb

## 2020-01-14 DIAGNOSIS — Z01419 Encounter for gynecological examination (general) (routine) without abnormal findings: Secondary | ICD-10-CM | POA: Diagnosis present

## 2020-01-14 NOTE — Patient Instructions (Signed)
Preventive Care 21-30 Years Old, Female Preventive care refers to visits with your health care provider and lifestyle choices that can promote health and wellness. This includes:  A yearly physical exam. This may also be called an annual well check.  Regular dental visits and eye exams.  Immunizations.  Screening for certain conditions.  Healthy lifestyle choices, such as eating a healthy diet, getting regular exercise, not using drugs or products that contain nicotine and tobacco, and limiting alcohol use. What can I expect for my preventive care visit? Physical exam Your health care provider will check your:  Height and weight. This may be used to calculate body mass index (BMI), which tells if you are at a healthy weight.  Heart rate and blood pressure.  Skin for abnormal spots. Counseling Your health care provider may ask you questions about your:  Alcohol, tobacco, and drug use.  Emotional well-being.  Home and relationship well-being.  Sexual activity.  Eating habits.  Work and work environment.  Method of birth control.  Menstrual cycle.  Pregnancy history. What immunizations do I need?  Influenza (flu) vaccine  This is recommended every year. Tetanus, diphtheria, and pertussis (Tdap) vaccine  You may need a Td booster every 10 years. Varicella (chickenpox) vaccine  You may need this if you have not been vaccinated. Human papillomavirus (HPV) vaccine  If recommended by your health care provider, you may need three doses over 6 months. Measles, mumps, and rubella (MMR) vaccine  You may need at least one dose of MMR. You may also need a second dose. Meningococcal conjugate (MenACWY) vaccine  One dose is recommended if you are age 19-21 years and a first-year college student living in a residence hall, or if you have one of several medical conditions. You may also need additional booster doses. Pneumococcal conjugate (PCV13) vaccine  You may need  this if you have certain conditions and were not previously vaccinated. Pneumococcal polysaccharide (PPSV23) vaccine  You may need one or two doses if you smoke cigarettes or if you have certain conditions. Hepatitis A vaccine  You may need this if you have certain conditions or if you travel or work in places where you may be exposed to hepatitis A. Hepatitis B vaccine  You may need this if you have certain conditions or if you travel or work in places where you may be exposed to hepatitis B. Haemophilus influenzae type b (Hib) vaccine  You may need this if you have certain conditions. You may receive vaccines as individual doses or as more than one vaccine together in one shot (combination vaccines). Talk with your health care provider about the risks and benefits of combination vaccines. What tests do I need?  Blood tests  Lipid and cholesterol levels. These may be checked every 5 years starting at age 20.  Hepatitis C test.  Hepatitis B test. Screening  Diabetes screening. This is done by checking your blood sugar (glucose) after you have not eaten for a while (fasting).  Sexually transmitted disease (STD) testing.  BRCA-related cancer screening. This may be done if you have a family history of breast, ovarian, tubal, or peritoneal cancers.  Pelvic exam and Pap test. This may be done every 3 years starting at age 21. Starting at age 30, this may be done every 5 years if you have a Pap test in combination with an HPV test. Talk with your health care provider about your test results, treatment options, and if necessary, the need for more tests.   Follow these instructions at home: Eating and drinking   Eat a diet that includes fresh fruits and vegetables, whole grains, lean protein, and low-fat dairy.  Take vitamin and mineral supplements as recommended by your health care provider.  Do not drink alcohol if: ? Your health care provider tells you not to drink. ? You are  pregnant, may be pregnant, or are planning to become pregnant.  If you drink alcohol: ? Limit how much you have to 0-1 drink a day. ? Be aware of how much alcohol is in your drink. In the [place] one drink equals one 12 oz bottle of beer (355 mL), one 5 oz glass of wine (148 mL), or one 1 oz glass of hard liquor (44 mL). Lifestyle  Take daily care of your teeth and gums.  Stay active. Exercise for at least 30 minutes on 5 or more days each week.  Do not use any products that contain nicotine or tobacco, such as cigarettes, e-cigarettes, and chewing tobacco. If you need help quitting, ask your health care provider.  If you are sexually active, practice safe sex. Use a condom or other form of birth control (contraception) in order to prevent pregnancy and STIs (sexually transmitted infections). If you plan to become pregnant, see your health care provider for a preconception visit. What's next?  Visit your health care provider once a year for a well check visit.  Ask your health care provider how often you should have your eyes and teeth checked.  Stay up to date on all vaccines. This information is not intended to replace advice given to you by your health care provider. Make sure you discuss any questions you have with your health care provider. Document Revised: 12/28/2017 Document Reviewed: 12/28/2017 Elsevier Patient Education  2020 [employer].

## 2020-01-14 NOTE — Progress Notes (Signed)
GYNECOLOGY ANNUAL PREVENTATIVE CARE ENCOUNTER NOTE  History:     Michelle Schultz is a 29 y.o. G53P1001 female here for a routine annual gynecologic exam.  Current complaints: none.  She is trying to conceive, has bene trying for less than a year and just started ovulation predictor kits.   Denies abnormal vaginal bleeding, discharge, pelvic pain, problems with intercourse or other gynecologic concerns.    Gynecologic History Patient's last menstrual period was 12/24/2019 (exact date). Contraception: none Last Pap: 2018. Results were: normal  Obstetric History OB History  Gravida Para Term Preterm AB Living  1 1 1     1   SAB TAB Ectopic Multiple Live Births        0 1    # Outcome Date GA Lbr Len/2nd Weight Sex Delivery Anes PTL Lv  1 Term 01/21/17 [redacted]w[redacted]d 14:13 / 03:54 8 lb 13.1 oz (4 kg) M CS-LTranv EPI  LIV    Past Medical History:  Diagnosis Date  . Eczema     Past Surgical History:  Procedure Laterality Date  . CESAREAN SECTION N/A 01/21/2017   Procedure: CESAREAN SECTION;  Surgeon: 01/23/2017, MD;  Location: [facility];  Service: Obstetrics;  Laterality: N/A;    Current Outpatient Medications on File Prior to Visit  Medication Sig Dispense Refill  . etonogestrel-ethinyl estradiol (NUVARING) 0.12-0.015 MG/24HR vaginal ring Insert vaginally and leave in place for 3 consecutive weeks, then remove for 1 week.start when you finish your current birth control pill month (Patient not taking: Reported on 01/14/2020) 3 each 3   No current facility-administered medications on file prior to visit.    No Known Allergies  Social History:  reports that she has quit smoking. She quit after 2.00 years of use. She has never used smokeless tobacco. She reports current alcohol use. She reports that she does not use drugs.  Family History  Problem Relation Age of Onset  . Hypertension Mother   . Heart attack Maternal Grandfather   . Heart attack Paternal Grandfather   .  Breast cancer Neg Hx   . Ovarian cancer Neg Hx   . Diabetes Neg Hx     The following portions of the patient's history were reviewed and updated as appropriate: allergies, current medications, past family history, past medical history, past social history, past surgical history and problem list.  Review of Systems Pertinent items noted in HPI and remainder of comprehensive ROS otherwise negative.  Physical Exam:  BP (!) 143/87   Pulse (!) 114   Ht 5\' 2"  (1.575 m)   Wt 165 lb 3.2 oz (74.9 kg)   LMP 12/24/2019 (Exact Date)   BMI 30.22 kg/m  CONSTITUTIONAL: Well-developed, well-nourished female in no acute distress.  HENT:  Normocephalic, atraumatic, External right and left ear normal. Oropharynx is clear and moist EYES: Conjunctivae and EOM are normal. Pupils are equal, round, and reactive to light. No scleral icterus.  NECK: Normal range of motion, supple, no masses.  Normal thyroid.  SKIN: Skin is warm and dry. No rash noted. Not diaphoretic. No erythema. No pallor. MUSCULOSKELETAL: Normal range of motion. No tenderness.  No cyanosis, clubbing, or edema.  2+ distal pulses. NEUROLOGIC: Alert and oriented to person, place, and time. Normal reflexes, muscle tone coordination.  PSYCHIATRIC: Normal mood and affect. Normal behavior. Normal judgment and thought content. CARDIOVASCULAR: Normal heart rate noted, regular rhythm RESPIRATORY: Clear to auscultation bilaterally. Effort and breath sounds normal, no problems with respiration noted. BREASTS: Symmetric in  size. No masses, tenderness, skin changes, nipple drainage, or lymphadenopathy bilaterally. Performed in the presence of a chaperone. ABDOMEN: Soft, no distention noted.  No tenderness, rebound or guarding.  Well-healed Pfannenstiel incision.  PELVIC: Normal appearing external genitalia and urethral meatus; normal appearing vaginal mucosa and cervix.  No abnormal discharge noted.  Pap smear obtained.  Normal uterine size, no other  palpable masses, no uterine or adnexal tenderness.Freely mobile uterus.   Performed in the presence of a chaperone.   Assessment and Plan:    1. Well woman exam with routine gynecological exam - Cytology - PAP Will follow up results of pap smear and manage accordingly. Told to continue ovulation predictor kits and start taking prenatal vitamins.  If still having problems in a few months or she needs further advice, she can call or be referred to [facility]. Routine preventative health maintenance measures emphasized. Please refer to After Visit Summary for other counseling recommendations.      Jaynie Collins, MD, FACOG Obstetrician & Gynecologist, [facility] for [employer], [facility]

## 2020-01-17 LAB — CYTOLOGY - PAP
Comment: NEGATIVE
Diagnosis: NEGATIVE
High risk HPV: NEGATIVE

## 2020-06-30 ENCOUNTER — Institutional Professional Consult (permissible substitution): Payer: No Typology Code available for payment source | Admitting: Obstetrics & Gynecology

## 2020-07-30 ENCOUNTER — Other Ambulatory Visit: Payer: Self-pay

## 2020-07-30 ENCOUNTER — Encounter: Payer: Self-pay | Admitting: Obstetrics & Gynecology

## 2020-07-30 ENCOUNTER — Ambulatory Visit (INDEPENDENT_AMBULATORY_CARE_PROVIDER_SITE_OTHER): Payer: No Typology Code available for payment source | Admitting: Obstetrics & Gynecology

## 2020-07-30 VITALS — BP 133/90 | HR 112 | Ht 61.0 in | Wt 166.4 lb

## 2020-07-30 DIAGNOSIS — N979 Female infertility, unspecified: Secondary | ICD-10-CM

## 2020-07-30 NOTE — Progress Notes (Signed)
   GYNECOLOGY OFFICE VISIT NOTE  History:   Michelle Schultz is a 30 y.o. 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  Has been trying to get pregnant for over a year. Has used ovulation tracking apps, ovulation predcitor kits, timed/regular intercourse. Has regular monthly menses, also ovulation kits show evidence of ovulation. Taking prenatal vitamins.  She denies any abnormal vaginal discharge, bleeding, pelvic pain or other concerns.    Past Medical History:  Diagnosis Date  . Eczema     Past Surgical History:  Procedure Laterality Date  . CESAREAN SECTION N/A 01/21/2017   Procedure: CESAREAN SECTION;  Surgeon: Tereso Newcomer, MD;  Location: [facility];  Service: Obstetrics;  Laterality: N/A;    The following portions of the patient's history were reviewed and updated as appropriate: allergies, current medications, past family history, past medical history, past social history, past surgical history and problem list.   Health Maintenance:  Normal pap and negative HRHPV on 01/14/2020.  Review of Systems:  Pertinent items noted in HPI and remainder of comprehensive ROS otherwise negative.  Physical Exam:  BP 133/90   Pulse (!) 112   Ht 5\' 1"  (1.549 m)   Wt 166 lb 6.4 oz (75.5 kg)   LMP 07/10/2020 (Exact Date)   BMI 31.44 kg/m  CONSTITUTIONAL: Well-developed, well-nourished female in no acute distress.  HEENT:  Normocephalic, atraumatic. External right and left ear normal. No scleral icterus.  NECK: Normal range of motion, supple, no masses noted on observation SKIN: No rash noted. Not diaphoretic. No erythema. No pallor. MUSCULOSKELETAL: Normal range of motion. No edema noted. NEUROLOGIC: Alert and oriented to person, place, and time. Normal muscle tone coordination. No cranial nerve deficit noted. PSYCHIATRIC: Normal mood and affect. Normal behavior. Normal judgment and  thought content. CARDIOVASCULAR: Normal heart rate noted RESPIRATORY: Effort and breath sounds normal, no problems with respiration noted ABDOMEN: No masses noted. No other overt distention noted.   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 Recommended continued use of ovulation predictor kits, will also check Day 3 FSH and estradiol levels.  HSG ordered, also recommended semen analysis.  Referral to Infertility specialist suggested, she will consider this pending lab and HSG evaluation. Will consider Letrozole therapy depending on results. - Follicle stimulating hormone; Future - Estradiol; Future - DG Hysterogram (HSG); Future - Anti mullerian hormone; Future Routine preventative health maintenance measures emphasized. Please refer to After Visit Summary for other counseling recommendations.   Return in about 12 days (around 08/11/2020) for Day 3 lab appointment for infertility labs.    I spent 20 minutes dedicated to the care of this patient including pre-visit review of records, face to face time with the patient discussing her conditions and treatments and post visit ordering of testing.    10/11/2020, MD, FACOG Obstetrician & Gynecologist, [facility] for [facility]

## 2020-07-30 NOTE — Patient Instructions (Addendum)
Come back on third day of period for labs  Call [facility] about timing of ultrasound and hysterosalpingogram  Husband has to get semen analysis  Research the use of Letrozole aka Femara for ovulation induction for infertility, and how this is superior to Clomid  Ask insurance company about coverage for infertility workup and infertility providers    Hysterosalpingography  Hysterosalpingography is a procedure to look inside a woman's womb (uterus) and fallopian tubes. During this procedure, contrast dye is injected into the uterus through the vagina and cervix. X-rays are then taken. The dye makes the uterus and fallopian tubes show up clearly on the X-rays and may show whether the tubes are blocked, scarred, or if there are any other abnormalities. This procedure may be done:  To determine if there are tumors, scars, or other abnormalities in the uterus.  To determine why a woman is unable to get pregnant or has had repeated pregnancy losses.  To make sure the fallopian tubes are completely blocked a few months after having certain tubal sterilization procedures. Tell a health care provider about:  Any allergies you have.  All medicines you are taking, including vitamins, herbs, eye drops, creams, and over-the-counter medicines.  Any problems you or family members have had with anesthetic medicines.  Any blood disorders you have.  Any surgeries you have had.  Any medical conditions you have.  Whether you are pregnant or may be pregnant.  Whether you have been diagnosed with an STI (sexually transmitted infection) or you think you have an STI. What are the risks? Generally, this is a safe procedure. However, problems may occur, including:  Infection in the lining of the uterus or fallopian tubes.  Allergic reaction to medicines or dyes.  A hole (perforation) in the uterus or fallopian tubes.  Damage to other structures or organs. What happens before the  procedure? Medicines Ask your health care provider about:  Changing or stopping your regular medicines. This is especially important if you are taking diabetes medicines or blood thinners.  Taking medicines such as aspirin and ibuprofen. These medicines can thin your blood. Do not take these medicines unless your health care provider tells you to take them.  Taking over-the-counter medicines, vitamins, herbs, and supplements. General instructions  Schedule the procedure after your menstrual period stops, but before your next ovulation. This is usually between day 5 and day 10 of your last period. Day 1 is the first day of your period.  Plan to have a responsible adult take you home from the hospital or clinic.  Plan to have a responsible adult care for you for the time you are told after you leave the hospital or clinic. This is important.  Empty your bladder before the procedure begins. What happens during the procedure?  You may be given: ? A medicine to help you relax (sedative). ? A medicine to numb the area (local anesthetic). ? An over-the-counter pain medicine.  You will lie down on your back and place your feet into footrests (stirrups).  A device called a speculum will be inserted into your vagina. This allows your health care provider to see inside your vagina through to your cervix.  Your cervix will be washed with a germ-killing soap.  A medicine may be injected into your cervix to numb it.  A thin, flexible tube will be passed through your cervix into your uterus.  Contrast dye will be passed through the tube and into the uterus. This may cause cramping.  Several X-rays will be taken as the contrast dye spreads through the uterus and into the fallopian tubes.  You may be asked to your change position and roll from side to side if needed.  The tube will be removed. The contrast dye will flow out through your vagina. The procedure may vary among health care  providers and hospitals. What can I expect after procedure?  You may have mild cramping and vaginal bleeding. This should go away after a short time.  Most of the contrast dye will flow out of your vagina. This fluid will be sticky and may have blood in it. You may want to wear a sanitary pad. Do not use a tampon.  You have mild dizziness or nausea. Follow these instructions at home:  Do not douche, use tampons, or have sex until your health care provider approves.  Do not take baths, swim, or use a hot tub until your health care provider approves. Take showers instead of baths for 2 weeks, or for as long as told by your health care provider.  If you were given a sedative during the procedure, it can affect you for several hours. Do not drive or operate machinery until your health care provider says that it is safe.  Take over-the-counter and prescription medicines only as told by your health care provider.  It is up to you to get the results of your procedure. Ask your health care provider, or the department that is doing the procedure, when your results will be ready.  Keep all follow-up visits. This is important. Contact a health care provider if:  You have a fever or chills.  You faint.  You have severe cramping.  Your skin has a rash, and is itchy or swollen.  You have a bad-smelling vaginal discharge.  You have vaginal bleeding that lasts for more than 4 days. Get help right away if:  You have nausea and vomiting.  You have heavy vaginal bleeding that soaks more than one pad every hour.  You have severe abdominal pain. Summary  Hysterosalpingography is a procedure in which a woman's uterus and fallopian tubes are examined.  During this procedure, contrast dye is injected into the uterus through the vagina and cervix. X-rays are then taken. The dye helps the uterus and fallopian tubes show up clearly on the X-rays.  Schedule the procedure after your menstrual  period stops, but before your next ovulation. This is usually between day 5 and day 10 of your last period.  After the procedure, you may have mild cramping and vaginal bleeding. This should go away after a short time.  Do not douche, use tampons, or have sex until your health care provider approves. This information is not intended to replace advice given to you by your health care provider. Make sure you discuss any questions you have with your health care provider. Document Revised: 12/18/2019 Document Reviewed: 12/04/2019 Elsevier Patient Education  2021 [employer]   Female Infertility  Female infertility refers to a woman's inability to get pregnant (conceive) after a year of having sex regularly (or after 6 months in women over age 73) without using birth control. Infertility can also mean that a woman is not able to carry a pregnancy to full term. Both women and men can have fertility problems. What are the causes? This condition may be caused by:  Problems with reproductive organs. Infertility can result if a woman: ? Has an abnormally short cervix or a cervix that does  not remain closed during a pregnancy. ? Has a blockage or scarring in the fallopian tubes. ? Has an abnormally shaped uterus. ? Has uterine fibroids. This is a benign mass of tissue or muscle (tumor) that can develop in the uterus. ? Is not ovulating in a regular way.  Certain medical conditions. These may include: ? Polycystic ovary syndrome (PCOS). This is a hormonal disorder that can cause small cysts to grow on the ovaries. This is the most common cause of infertility in women. ? Endometriosis. This is a condition in which the tissue that lines the uterus (endometrium) grows outside of its normal location. ? Cancer and cancer treatments, such as chemotherapy or radiation. ? Premature ovarian failure. This is when ovaries stop producing eggs and hormones before age 24. ? Sexually transmitted diseases, such  as chlamydia or gonorrhea. ? Autoimmune disorders. These are disorders in which the body's defense system (immune system) attacks normal, healthy cells. Infertility can be linked to more than one cause. For some women, the cause of infertility is not known (unexplained infertility). What increases the risk?  Age. A woman's fertility declines with age, especially after her mid-30s.  Being underweight or overweight.  Drinking too much alcohol.  Using drugs such as anabolic steroids, cocaine, and marijuana.  Exercising excessively.  Being exposed to environmental toxins, such as radiation, pesticides, and certain chemicals. What are the signs or symptoms? The main sign of infertility in women is the inability to get pregnant or carry a pregnancy to full term. How is this diagnosed? This condition may be diagnosed by:  Checking whether you are ovulating each month. The tests may include: ? Blood tests to check hormone levels. ? An ultrasound of the ovaries. ? Taking a small tissue that lines the uterus and checking it under a microscope (endometrial biopsy).  Doing additional tests. This is done if ovulation is normal. Tests may include: ? Hysterosalpingography. This X-ray test can show the shape of the uterus and whether the fallopian tubes are open. ? Laparoscopy. This test uses a lighted tube (laparoscope) to look for problems in the fallopian tubes and other organs. ? Transvaginal ultrasound. This imaging test is used to check for abnormalities in the uterus and ovaries. ? Hysteroscopy. This test uses a lighted tube to check for problems in the cervix and the uterus. To be diagnosed with infertility, both partners will have a physical exam. Both partners will also have an extensive medical and sexual history taken. Additional tests may be done. How is this treated? Treatment depends on the cause of infertility. Most cases of infertility in women are treated with medicine or  surgery.  Women may take medicine to: ? Correct ovulation problems. ? Treat other health conditions.  Surgery may be done to: ? Repair damage to the ovaries, fallopian tubes, cervix, or uterus. ? Remove growths from the uterus. ? Remove scar tissue from the uterus, pelvis, or other organs. Assisted reproductive technology (ART) Assisted reproductive technology (ART) refers to all treatments and procedures that combine eggs and sperm outside the body to try to help a couple conceive. ART is often combined with fertility drugs to stimulate ovulation. Sometimes ART is done using eggs retrieved from another woman's body (donor eggs) or from previously frozen fertilized eggs (embryos). There are different types of ART. These include:  Intrauterine insemination (IUI). A long, thin tube is used to place sperm directly into a woman's uterus. This procedure: ? Is effective for infertility caused by sperm problems, including  low sperm count and low motility. ? Can be used in combination with fertility drugs.  In vitro fertilization (IVF). This is done when a woman's fallopian tubes are blocked or when a man has low sperm count. In this procedure: ? Fertility drugs are used to stimulate the ovaries to produce multiple eggs. ? Once mature, these eggs are removed from the body and combined with the sperm to be fertilized. ? The fertilized eggs are then placed into the woman's uterus. Follow these instructions at home:  Take over-the-counter and prescription medicines only as told by your health care provider.  Do not use any products that contain nicotine or tobacco, such as cigarettes and e-cigarettes. If you need help quitting, ask your health care provider.  If you drink alcohol, limit how much you have to 1 drink a day.  Make dietary changes to lose weight or maintain a healthy weight. Work with your health care provider and a dietitian to set a weight-loss goal that is healthy and reasonable  for you.  Seek support from a counselor or support group to talk about your concerns related to infertility. Couples counseling may be helpful for you and your partner.  Practice stress reduction techniques that work well for you, such as regular physical activity, meditation, or deep breathing.  Keep all follow-up visits as told by your health care provider. This is important. Contact a health care provider if you:  Feel that stress is interfering with your life and relationships.  Have side effects from treatments for infertility. Summary  Female infertility refers to a woman's inability to get pregnant (conceive) after a year of having sex regularly (or after 6 months in women over age 31) without using birth control.  To be diagnosed with infertility, both partners will have a physical exam. Both partners will also have an extensive medical and sexual history taken.  Seek support from a counselor or support group to talk about your concerns related to infertility. Couples counseling may be helpful for you and your partner. This information is not intended to replace advice given to you by your health care provider. Make sure you discuss any questions you have with your health care provider. Document Revised: 01/30/2020 Document Reviewed: 12/20/2019 Elsevier Patient Education  2021 [employer]

## 2020-08-10 ENCOUNTER — Other Ambulatory Visit: Payer: No Typology Code available for payment source

## 2020-08-10 ENCOUNTER — Other Ambulatory Visit: Payer: Self-pay

## 2020-08-10 DIAGNOSIS — N979 Female infertility, unspecified: Secondary | ICD-10-CM

## 2020-08-11 ENCOUNTER — Other Ambulatory Visit: Payer: No Typology Code available for payment source

## 2020-08-15 LAB — ESTRADIOL: Estradiol: 34.9 pg/mL

## 2020-08-15 LAB — FOLLICLE STIMULATING HORMONE: FSH: 7.2 m[IU]/mL

## 2020-08-15 LAB — ANTI MULLERIAN HORMONE: ANTI-MULLERIAN HORMONE (AMH): 4.46 ng/mL

## 2020-09-14 ENCOUNTER — Ambulatory Visit
Admission: RE | Admit: 2020-09-14 | Discharge: 2020-09-14 | Disposition: A | Discharge status: Home / Self Care | Payer: No Typology Code available for payment source | Source: Ambulatory Visit | Attending: Obstetrics & Gynecology | Admitting: Obstetrics & Gynecology

## 2020-09-14 DIAGNOSIS — N979 Female infertility, unspecified: Secondary | ICD-10-CM

## 2020-11-19 ENCOUNTER — Ambulatory Visit (INDEPENDENT_AMBULATORY_CARE_PROVIDER_SITE_OTHER): Payer: No Typology Code available for payment source

## 2020-11-19 ENCOUNTER — Other Ambulatory Visit: Payer: Self-pay

## 2020-11-19 ENCOUNTER — Ambulatory Visit (INDEPENDENT_AMBULATORY_CARE_PROVIDER_SITE_OTHER): Payer: No Typology Code available for payment source | Admitting: Obstetrics & Gynecology

## 2020-11-19 ENCOUNTER — Encounter: Payer: Self-pay | Admitting: Obstetrics & Gynecology

## 2020-11-19 ENCOUNTER — Other Ambulatory Visit (HOSPITAL_COMMUNITY)
Admission: RE | Admit: 2020-11-19 | Discharge: 2020-11-19 | Disposition: A | Discharge status: Home / Self Care | Payer: No Typology Code available for payment source | Source: Ambulatory Visit | Attending: Obstetrics & Gynecology | Admitting: Obstetrics & Gynecology

## 2020-11-19 VITALS — BP 137/86 | HR 111 | Wt 161.0 lb

## 2020-11-19 DIAGNOSIS — Z3480 Encounter for supervision of other normal pregnancy, unspecified trimester: Secondary | ICD-10-CM | POA: Diagnosis present

## 2020-11-19 DIAGNOSIS — O09299 Supervision of pregnancy with other poor reproductive or obstetric history, unspecified trimester: Secondary | ICD-10-CM

## 2020-11-19 DIAGNOSIS — O0990 Supervision of high risk pregnancy, unspecified, unspecified trimester: Secondary | ICD-10-CM | POA: Insufficient documentation

## 2020-11-19 DIAGNOSIS — Z3201 Encounter for pregnancy test, result positive: Secondary | ICD-10-CM

## 2020-11-19 DIAGNOSIS — Z3A10 10 weeks gestation of pregnancy: Secondary | ICD-10-CM

## 2020-11-19 DIAGNOSIS — Z8759 Personal history of other complications of pregnancy, childbirth and the puerperium: Secondary | ICD-10-CM

## 2020-11-19 DIAGNOSIS — O1211 Gestational proteinuria, first trimester: Secondary | ICD-10-CM

## 2020-11-19 DIAGNOSIS — O34219 Maternal care for unspecified type scar from previous cesarean delivery: Secondary | ICD-10-CM

## 2020-11-19 DIAGNOSIS — O219 Vomiting of pregnancy, unspecified: Secondary | ICD-10-CM

## 2020-11-19 HISTORY — DX: Supervision of pregnancy with other poor reproductive or obstetric history, unspecified trimester: O09.299

## 2020-11-19 HISTORY — DX: Personal history of other complications of pregnancy, childbirth and the puerperium: Z87.59

## 2020-11-19 MED ORDER — DOXYLAMINE-PYRIDOXINE 10-10 MG PO TBEC: 2.0000 | DELAYED_RELEASE_TABLET | Freq: Every day | ORAL | 5 refills | Status: DC

## 2020-11-19 MED ORDER — PROMETHAZINE HCL 25 MG PO TABS: 25.0000 mg | ORAL_TABLET | Freq: Four times a day (QID) | ORAL | 2 refills | Status: DC | PRN

## 2020-11-19 MED ORDER — ASPIRIN EC 81 MG PO TBEC
81.0000 mg | DELAYED_RELEASE_TABLET | Freq: Every day | ORAL | 2 refills | Status: DC
Start: 2020-11-19 — End: 2021-05-30

## 2020-11-19 NOTE — Patient Instructions (Signed)
Obstetrics: Normal and Problem Pregnancies (7th ed., pp. 102-121). [place], [place]: Elsevier."> Textbook of Family Medicine (9th ed., pp. 365-410). [place], [place]: Elsevier [employer].">  First Trimester of Pregnancy  The first trimester of pregnancy starts on the first day of your last menstrual period until the end of week 12. This is months 1 through 3 of pregnancy. A week after a sperm fertilizes an egg, the egg will implant into the wall of the uterus and begin to develop into a baby. By the end of 12 weeks, all the baby'sorgans will be formed and the baby will be 2-3 inches in size. Body changes during your first trimester Your body goes through many changes during pregnancy. The changes vary andgenerally return to normal after your baby is born. Physical changes You may gain or lose weight. Your breasts may begin to grow larger and become tender. The tissue that surrounds your nipples (areola) may become darker. Dark spots or blotches (chloasma or mask of pregnancy) may develop on your face. You may have changes in your hair. These can include thickening or thinning of your hair or changes in texture. Health changes You may feel nauseous, and you may vomit. You may have heartburn. You may develop headaches. You may develop constipation. Your gums may bleed and may be sensitive to brushing and flossing. Other changes You may tire easily. You may urinate more often. Your menstrual periods will stop. You may have a loss of appetite. You may develop cravings for certain kinds of food. You may have changes in your emotions from day to day. You may have more vivid and strange dreams. Follow these instructions at home: Medicines Follow your health care provider's instructions regarding medicine use. Specific medicines may be either safe or unsafe to take during pregnancy. Do not take any medicines unless told to by your health care provider. Take a prenatal vitamin that contains at least  600 micrograms (mcg) of folic acid. Eating and drinking Eat a healthy diet that includes fresh fruits and vegetables, whole grains, good sources of protein such as meat, eggs, or tofu, and low-fat dairy products. Avoid raw meat and unpasteurized juice, milk, and cheese. These carry germs that can harm you and your baby. If you feel nauseous or you vomit: Eat 4 or 5 small meals a day instead of 3 large meals. Try eating a few soda crackers. Drink liquids between meals instead of during meals. You may need to take these actions to prevent or treat constipation: Drink enough fluid to keep your urine pale yellow. Eat foods that are high in fiber, such as beans, whole grains, and fresh fruits and vegetables. Limit foods that are high in fat and processed sugars, such as fried or sweet foods. Activity Exercise only as directed by your health care provider. Most people can continue their usual exercise routine during pregnancy. Try to exercise for 30 minutes at least 5 days a week. Stop exercising if you develop pain or cramping in the lower abdomen or lower back. Avoid exercising if it is very hot or humid or if you are at high altitude. Avoid heavy lifting. If you choose to, you may have sex unless your health care provider tells you not to. Relieving pain and discomfort Wear a good support bra to relieve breast tenderness. Rest with your legs elevated if you have leg cramps or low back pain. If you develop bulging veins (varicose veins) in your legs: Wear support hose as told by your health care provider. Elevate   your feet for 15 minutes, 3-4 times a day. Limit salt in your diet. Safety Wear your seat belt at all times when driving or riding in a car. Talk with your health care provider if someone is verbally or physically abusive to you. Talk with your health care provider if you are feeling sad or have thoughts of hurting yourself. Lifestyle Do not use hot tubs, steam rooms, or  saunas. Do not douche. Do not use tampons or scented sanitary pads. Do not use herbal remedies, alcohol, illegal drugs, or medicines that are not approved by your health care provider. Chemicals in these products can harm your baby. Do not use any products that contain nicotine or tobacco, such as cigarettes, e-cigarettes, and chewing tobacco. If you need help quitting, ask your health care provider. Avoid cat litter boxes and soil used by cats. These carry germs that can cause birth defects in the baby and possibly loss of the unborn baby (fetus) by miscarriage or stillbirth. General instructions During routine prenatal visits in the first trimester, your health care provider will do a physical exam, perform necessary tests, and ask you how things are going. Keep all follow-up visits. This is important. Ask for help if you have counseling or nutritional needs during pregnancy. Your health care provider can offer advice or refer you to specialists for help with various needs. Schedule a dentist appointment. At home, brush your teeth with a soft toothbrush. Floss gently. Write down your questions. Take them to your prenatal visits. Where to find more information American Pregnancy Association: americanpregnancy.org [employer] of [employer]st[employer]ians and Gynecologists: https://www.todd-brady.net/ Office on [employer] Health: MightyReward.co.nz Contact a health care provider if you have: Dizziness. A fever. Mild pelvic cramps, pelvic pressure, or nagging pain in the abdominal area. Nausea, vomiting, or diarrhea that lasts for 24 hours or longer. A bad-smelling vaginal discharge. Pain when you urinate. Known exposure to a contagious illness, such as chickenpox, measles, Zika virus, HIV, or hepatitis. Get help right away if you have: Spotting or bleeding from your vagina. Severe abdominal cramping or pain. Shortness of breath or chest pain. Any kind of trauma, such as from a fall  or a car crash. New or increased pain, swelling, or redness in an arm or leg. Summary The first trimester of pregnancy starts on the first day of your last menstrual period until the end of week 12 (months 1 through 3). Eating 4 or 5 small meals a day rather than 3 large meals may help to relieve nausea and vomiting. Do not use any products that contain nicotine or tobacco, such as cigarettes, e-cigarettes, and chewing tobacco. If you need help quitting, ask your health care provider. Keep all follow-up visits. This is important. This information is not intended to replace advice given to you by your health care provider. Make sure you discuss any questions you have with your healthcare provider.  Vaginal Birth After Cesarean Delivery  Vaginal birth after cesarean delivery (VBAC) means giving birth vaginally after previously delivering a baby through a cesarean section, or C-section. VBAC maybe a safe option for you, depending on your health and other factors. Discuss VBAC with your health care provider early in your pregnancy, so you can understand the risks, benefits, and options. Having these discussions earlywill give you time to make your birth plan. What increases the chances for a successful VBAC? These factors increase your chances of a successful VBAC: You have had only one previous C-section. You had a low transverse incision for  your C-section. You have had a successful vaginal birth. Your labor starts naturally on or before your due date. You and your baby have had a healthy pregnancy. Your baby is head-down. What happens when I arrive at the birth center or hospital? Once you are in labor and have been admitted into the hospital or birth center, your health care team may: Review your pregnancy history and go over any concerns you have. Talk with you about your birth plan and discuss pain control options. Check your blood pressure, breathing rate, and heart rate. Check your  baby's heartbeat. Monitor your uterus for contractions. Check whether your bag of water (amniotic sac) has broken (ruptured). Insert an IV into one of your veins. You may get fluids and medicine through the IV. Monitoring and exams Your health care team may check your contractions (uterine monitoring) and your baby's heart rate (fetal monitoring). You may need to be monitored for long periods at a time (continuously) with a monitoring device. Your health care provider may also do frequent physical exams. These may include checking how and where your baby is positioned in your uterus and checking your cervix to determine whether it is opening up (dilating) or thinning out (effacing). What happens during labor and delivery? Normal labor and delivery are divided into the following three stages: Stage 1 This is the longest stage of labor. Throughout this stage, you will feel contractions. Contractions are generally mild, infrequent, and irregular at first. They get stronger, more frequent (about every 2-3 minutes), and more regular as you move through this stage. The first stage ends when your cervix is completely dilated to 4 inches (10 cm) and effaced. Stage 2 This stage starts once your cervix is completely dilated and effaced and lasts until you deliver your baby. In this stage, you will feel the urge to push your baby out of your vagina. You may feel stretching and burning pain, especially when the widest part of your baby's head passes through the vaginal opening (crowning). Once your baby is delivered, the umbilical cord will be clamped and cut. Your baby will be placed on your bare chest and covered with a warm blanket. If you are planning to breastfeed, watch your baby for feeding cues, like rooting or sucking. Stage 3 This stage starts immediately after your baby is born and ends after you deliver the placenta. This stage may take anywhere from 5 to 30 minutes. What can I expect after  labor and delivery? After labor is over, you and your baby will be checked to make sure you are both healthy, and you will both be monitored until you are ready to go home. You may be encouraged to stay in the same room with your baby to promote early bonding and successful breastfeeding. Your uterus will be checked and massaged regularly (fundal massage). You may continue to receive fluids and medicines through an IV. You will have some soreness and pain in your abdomen, vagina, and the area of skin between your vaginal opening and your anus (perineum). If an incision was made near your vagina (episiotomy) or if you had some vaginal tearing during delivery, cold compresses may be used to reduce pain and swelling. Follow these instructions at home: Incision care If you have an episiotomy incision, follow instructions from your health care provider about how to take care of your incision. Check your incision area every day for signs of infection. Check for: Redness, swelling, or pain. More fluid or blood. Warmth. Pus  or a bad smell. If directed, put ice on the episiotomy area. To do this: Put ice in a plastic bag. Place a towel between your skin and the bag. Leave the ice on for 20 minutes, 2-3 times a day. Remove the ice if your skin turns bright red. This is very important. If you cannot feel pain, heat, or cold, you have a greater risk of damage to the area. Activity Return to your normal activities as told by your health care provider. Ask your health care provider what activities are safe for you. Avoid sitting for a long time without moving. Get up to take short walks every 1-2 hours. General instructions Take over-the-counter and prescription medicines only as told by your health care provider. Do not take baths, swim, or use a hot tub until your health care provider approves. Ask if you may take showers. You may only be allowed to take sponge baths. It is normal to have vaginal  bleeding after delivery. Wear a sanitary pad for vaginal bleeding and discharge. Keep all follow-up visits. This is important. Where to find more information American Pregnancy Association: americanpregnancy.org [employer]st[employer]ians and Gynecologists: acog.org Summary Vaginal birth after cesarean delivery (VBAC) means giving birth vaginally after previously delivering a baby through a cesarean section, or C-section. Once you are in labor and have been admitted into the hospital or birth center, your health care team may review your pregnancy history and go over any concerns you have. Although most women are able to have a successful VBAC, sometimes it is necessary to have another C-section. Keep all follow-up visits. This is important. This information is not intended to replace advice given to you by your health care provider. Make sure you discuss any questions you have with your healthcare provider. Document Revised: 04/16/2020 Document Reviewed: 04/16/2020 Elsevier Patient Education  2022 Elsevier Inc.  Document Revised: 09/25/2019 Document Reviewed: 08/01/2019 Elsevier Patient Education  2022 [employer].

## 2020-11-19 NOTE — Progress Notes (Signed)
Patient informed that the ultrasound is considered a limited obstetric ultrasound and is not intended to be a complete ultrasound exam.  Patient also informed that the ultrasound is not being completed with the intent of assessing for fetal or placental anomalies or any pelvic abnormalities. Explained that the purpose of today's ultrasound is to assess for fetal heart rate.  Patient acknowledges the purpose of the exam and the limitations of the study.         

## 2020-11-19 NOTE — Progress Notes (Signed)
History:   Michelle Schultz is a 30 y.o. G2P1001 at [redacted]w[redacted]d by LMP being seen today for her first obstetrical visit.  Her previous pregnancy history is significant for intrapartum severe pre-eclampsia /HELLP syndrome and cesarean section for failure to progress/arrest of descent after failed attempt of operative vaginal delivery. Delivery also complicated by PPH of 2 L, received three units of pRBCs.  Patient does intend to breast feed. Accompanied by her husband and son.  Pregnancy history fully reviewed.  Patient reports nausea and vomiting. Desires medication to help.      HISTORY: OB History  Gravida Para Term Preterm AB Living  2 1 1  0 0 1  SAB IAB Ectopic Multiple Live Births  0 0 0 0 1    # Outcome Date GA Lbr Len/2nd Weight Sex Delivery Anes PTL Lv  2 Current           1 Term 01/21/17 [redacted]w[redacted]d 14:13 / 03:54 8 lb 13.1 oz (4 kg) M CS-LTranv EPI  LIV     Name: Hoston,BOY Mialani     Apgar1: 5  Apgar5: 7    Last pap smear was done 01/14/2020 and was normal  Past Medical History:  Diagnosis Date   Eczema    Past Surgical History:  Procedure Laterality Date   CESAREAN SECTION N/A 01/21/2017   Procedure: CESAREAN SECTION;  Surgeon: 01/23/2017, MD;  Location: [facility];  Service: Obstetrics;  Laterality: N/A;   Family History  Problem Relation Age of Onset   Hypertension Mother    Heart attack Maternal Grandfather    Heart attack Paternal Grandfather    Breast cancer Neg Hx    Ovarian cancer Neg Hx    Diabetes Neg Hx    Social History   Tobacco Use   Smoking status: Former    Years: 2.00    Types: Cigarettes   Smokeless tobacco: Never   Tobacco comments:    1 pack/week-quit 2017  Substance Use Topics   Alcohol use: Yes    Comment: prior to pregnancy   Drug use: No   No Known Allergies Current Outpatient Medications on File Prior to Visit  Medication Sig Dispense Refill   Prenatal Vit-Fe Fumarate-FA (PRENATAL MULTIVITAMIN) TABS tablet Take 1 tablet by  mouth daily at 12 noon.     No current facility-administered medications on file prior to visit.    Review of Systems Pertinent items noted in HPI and remainder of comprehensive ROS otherwise negative.  Physical Exam:   Vitals:   11/19/20 1031  BP: 137/86  Pulse: (!) 111  Weight: 161 lb (73 kg)   Fetal Heart Rate (bpm): 160 on ultrasound (see separate report) General: well-developed, well-nourished female in no acute distress  Breasts:  deferred  Skin: normal coloration and turgor, no rashes  Neurologic: oriented, normal, negative, normal mood  Extremities: normal strength, tone, and muscle mass, ROM of all joints is normal  HEENT PERRLA, extraocular movement intact and sclera clear, anicteric  Neck supple and no masses  Cardiovascular: regular rate and rhythm  Respiratory:  no respiratory distress, normal breath sounds  Abdomen: soft, non-tender; bowel sounds normal; no masses,  no organomegaly  Pelvic: deferred      Assessment:    Pregnancy: G2P1001 Patient Active Problem List   Diagnosis Date Noted   Supervision of other normal pregnancy, antepartum 11/19/2020   History of HELLP, currently pregnant 11/19/2020   History of postpartum hemorrhage 11/19/2020   Previous cesarean delivery for failure to  progress, antepartum 09/14/2016     Plan:    1. Nausea and vomiting during pregnancy Antiemetics prescribed as needed. - Doxylamine-Pyridoxine (DICLEGIS) 10-10 MG TBEC; Take 2 tablets by mouth at bedtime. If symptoms persist, add one tablet in the morning and one in the afternoon  Dispense: 100 tablet; Refill: 5 - promethazine (PHENERGAN) 25 MG tablet; Take 1 tablet (25 mg total) by mouth every 6 (six) hours as needed for nausea or vomiting.  Dispense: 30 tablet; Refill: 2  2. Previous cesarean delivery, antepartum Considering RCS given rough delivery last time.   3. History of postpartum hemorrhage Will monitor patient closely during and after delivery.  4. History  of HELLP prior pregnancy, currently pregnant Surveillance labs ordered, ASA prescribed for preeclampsia prophylaxis. Monitor BP. - Comprehensive metabolic panel - Protein / creatinine ratio, urine - aspirin EC 81 MG tablet; Take 1 tablet (81 mg total) by mouth daily. Take after 12 weeks for prevention of preeclampsia later in pregnancy  Dispense: 300 tablet; Refill: 2  5. [redacted] weeks gestation of pregnancy 6. Supervision of other normal pregnancy, antepartum - US OB Limited; Future - CBC/D/Plt+RPR+Rh+ABO+RubIgG... - Culture, OB Urine - Genetic Screening - GC/Chlamydia probe amp ([place])not at [facility] - TSH - Hemoglobin A1c - [place] MFM OB COMP + 14 WK; Future - Babyscripts Schedule Optimization Initial labs drawn. Continue prenatal vitamins. Problem list reviewed and updated. Genetic Screening discussed, NIPS: ordered. Ultrasound discussed; fetal anatomic survey: ordered. Anticipatory guidance about prenatal visits given including labs, ultrasounds, and testing. Discussed usage of Babyscripts and virtual visits as additional source of managing and completing prenatal visits in midst of coronavirus and pandemic.   Encouraged to complete MyChart Registration for her ability to review results, send requests, and have questions addressed.  The nature of  - Center for [facility] with multiple MDs and Advanced Practice Providers was explained to patient; also emphasized that residents, students are part of our team. Routine obstetric precautions reviewed. Encouraged to seek out care at office or emergency room [facility] preferred) for urgent and/or emergent concerns. Return in about 4 weeks (around 12/17/2020) for OFFICE OB VISIT (MD only).     Jaynie Collins, MD, FACOG Obstetrician & Gynecologist, [facility] for [employer], [facility]

## 2020-11-20 DIAGNOSIS — O1211 Gestational proteinuria, first trimester: Secondary | ICD-10-CM | POA: Insufficient documentation

## 2020-11-20 LAB — COMPREHENSIVE METABOLIC PANEL
ALT: 21 IU/L (ref 0–32)
AST: 19 IU/L (ref 0–40)
Albumin/Globulin Ratio: 1.8 (ref 1.2–2.2)
Albumin: 4.6 g/dL (ref 3.8–4.8)
Alkaline Phosphatase: 60 IU/L (ref 44–121)
BUN/Creatinine Ratio: 8 — ABNORMAL LOW (ref 9–23)
BUN: 5 mg/dL — ABNORMAL LOW (ref 6–20)
Bilirubin Total: 0.4 mg/dL (ref 0.0–1.2)
CO2: 18 mmol/L — ABNORMAL LOW (ref 20–29)
Calcium: 9.4 mg/dL (ref 8.7–10.2)
Chloride: 105 mmol/L (ref 96–106)
Creatinine, Ser: 0.63 mg/dL (ref 0.57–1.00)
Globulin, Total: 2.6 g/dL (ref 1.5–4.5)
Glucose: 90 mg/dL (ref 65–99)
Potassium: 3.7 mmol/L (ref 3.5–5.2)
Sodium: 138 mmol/L (ref 134–144)
Total Protein: 7.2 g/dL (ref 6.0–8.5)
eGFR: 122 mL/min/{1.73_m2} (ref >59)

## 2020-11-20 LAB — CBC/D/PLT+RPR+RH+ABO+RUBIGG...
Antibody Screen: NEGATIVE
Basophils Absolute: 0 10*3/uL (ref 0.0–0.2)
Basos: 1 %
EOS (ABSOLUTE): 0.2 10*3/uL (ref 0.0–0.4)
Eos: 2 %
HCV Ab: <0.1 s/co ratio (ref 0.0–0.9)
HIV Screen 4th Generation wRfx: NONREACTIVE
Hematocrit: 40.5 % (ref 34.0–46.6)
Hemoglobin: 13.6 g/dL (ref 11.1–15.9)
Hepatitis B Surface Ag: NEGATIVE
Immature Grans (Abs): 0 10*3/uL (ref 0.0–0.1)
Immature Granulocytes: 0 %
Lymphocytes Absolute: 1.1 10*3/uL (ref 0.7–3.1)
Lymphs: 18 %
MCH: 29.4 pg (ref 26.6–33.0)
MCHC: 33.6 g/dL (ref 31.5–35.7)
MCV: 88 fL (ref 79–97)
Monocytes Absolute: 0.3 10*3/uL (ref 0.1–0.9)
Monocytes: 5 %
Neutrophils Absolute: 4.7 10*3/uL (ref 1.4–7.0)
Neutrophils: 74 %
Platelets: 224 10*3/uL (ref 150–450)
RBC: 4.63 x10E6/uL (ref 3.77–5.28)
RDW: 12.6 % (ref 11.7–15.4)
RPR Ser Ql: NONREACTIVE
Rh Factor: POSITIVE
Rubella Antibodies, IGG: <0.9 index — ABNORMAL LOW (ref >0.99)
WBC: 6.4 10*3/uL (ref 3.4–10.8)

## 2020-11-20 LAB — PROTEIN / CREATININE RATIO, URINE
Creatinine, Urine: 13.9 mg/dL
Protein, Ur: 6.2 mg/dL
Protein/Creat Ratio: 446 mg/g creat — ABNORMAL HIGH (ref 0–200)

## 2020-11-20 LAB — GC/CHLAMYDIA PROBE AMP (~~LOC~~) NOT AT ARMC
Chlamydia: NEGATIVE
Comment: NEGATIVE
Comment: NORMAL
Neisseria Gonorrhea: NEGATIVE

## 2020-11-20 LAB — HEMOGLOBIN A1C
Est. average glucose Bld gHb Est-mCnc: 105 mg/dL
Hgb A1c MFr Bld: 5.3 % (ref 4.8–5.6)

## 2020-11-20 LAB — TSH: TSH: 0.901 u[IU]/mL (ref 0.450–4.500)

## 2020-11-20 LAB — HCV INTERPRETATION

## 2020-11-21 LAB — URINE CULTURE, OB REFLEX

## 2020-11-21 LAB — CULTURE, OB URINE

## 2020-11-30 ENCOUNTER — Telehealth: Payer: Self-pay | Admitting: Radiology

## 2020-11-30 ENCOUNTER — Encounter: Payer: Self-pay | Admitting: Radiology

## 2020-11-30 NOTE — Telephone Encounter (Signed)
Informed her of panorama and horizon results pt will pick up fetal sex

## 2020-11-30 NOTE — Telephone Encounter (Signed)
Left message for her horizon and panorama results

## 2020-12-15 ENCOUNTER — Encounter: Payer: Self-pay | Admitting: Obstetrics & Gynecology

## 2020-12-15 ENCOUNTER — Encounter: Payer: Self-pay | Admitting: Radiology

## 2020-12-17 ENCOUNTER — Other Ambulatory Visit: Payer: Self-pay

## 2020-12-17 ENCOUNTER — Ambulatory Visit (INDEPENDENT_AMBULATORY_CARE_PROVIDER_SITE_OTHER): Payer: No Typology Code available for payment source | Admitting: Family Medicine

## 2020-12-17 VITALS — BP 136/86 | HR 93 | Wt 160.0 lb

## 2020-12-17 DIAGNOSIS — O1211 Gestational proteinuria, first trimester: Secondary | ICD-10-CM

## 2020-12-17 DIAGNOSIS — O34219 Maternal care for unspecified type scar from previous cesarean delivery: Secondary | ICD-10-CM

## 2020-12-17 DIAGNOSIS — O09299 Supervision of pregnancy with other poor reproductive or obstetric history, unspecified trimester: Secondary | ICD-10-CM

## 2020-12-17 DIAGNOSIS — Z3A14 14 weeks gestation of pregnancy: Secondary | ICD-10-CM

## 2020-12-17 DIAGNOSIS — Z3480 Encounter for supervision of other normal pregnancy, unspecified trimester: Secondary | ICD-10-CM

## 2020-12-17 DIAGNOSIS — Z2839 Other underimmunization status: Secondary | ICD-10-CM

## 2020-12-17 NOTE — Progress Notes (Signed)
   PRENATAL VISIT NOTE  Subjective:  Michelle Schultz is a 30 y.o. G2P1001 at [redacted]w[redacted]d being seen today for ongoing prenatal care.  She is currently monitored for the following issues for this high-risk pregnancy and has Previous cesarean delivery for failure to progress, antepartum; Rubella non-immune status, antepartum; Supervision of other normal pregnancy, antepartum; History of HELLP, currently pregnant; History of postpartum hemorrhage; and Proteinuria affecting pregnancy in first trimester on their problem list.  Patient reports no complaints.  Contractions: Not present. Vag. Bleeding: None.   . Denies leaking of fluid.   The following portions of the patient's history were reviewed and updated as appropriate: allergies, current medications, past family history, past medical history, past social history, past surgical history and problem list.   Objective:   Vitals:   12/17/20 1046  BP: 136/86  Pulse: 93  Weight: 160 lb (72.6 kg)    Fetal Status: Fetal Heart Rate (bpm): 146         General:  Alert, oriented and cooperative. Patient is in no acute distress.  Skin: Skin is warm and dry. No rash noted.   Cardiovascular: Normal heart rate noted  Respiratory: Normal respiratory effort, no problems with respiration noted  Abdomen: Soft, gravid, appropriate for gestational age.  Pain/Pressure: Absent     Pelvic: Cervical exam deferred        Extremities: Normal range of motion.  Edema: None  Mental Status: Normal mood and affect. Normal behavior. Normal judgment and thought content.   Assessment and Plan:  Pregnancy: G2P1001 at [redacted]w[redacted]d 1. History of HELLP, currently pregnant On ASA  2. Previous cesarean delivery for failure to progress, antepartum Deciding about delivery this time  3. Supervision of other normal pregnancy, antepartum Anatomy US scheduled  4. Rubella non-immune status, antepartum Needs MMR pp, avoid any measles outbreaks  5. Proteinuria affecting pregnancy in first  trimester Unclear etiology--had some acute renal insufficiency during her HELLP last time as well. Will repeat urine Pr:Cr and if still elevated will send to renal. If has some underlying renal pathology, might explain the HELLP syndrome last pregnancy. Plts are normal now and Cr is WNL.  General obstetric precautions including but not limited to vaginal bleeding, contractions, leaking of fluid and fetal movement were reviewed in detail with the patient. Please refer to After Visit Summary for other counseling recommendations.   Return in 4 weeks (on 01/14/2021).  Future Appointments  Date Time Provider Department Center  01/13/2021  2:40 PM Darlina Rumpf, CNM [facility]    Donnamae Jude, MD

## 2020-12-17 NOTE — Patient Instructions (Signed)

## 2020-12-18 ENCOUNTER — Telehealth: Payer: Self-pay

## 2020-12-18 NOTE — Telephone Encounter (Signed)
Left a message for the pt to call about the date and time for ultrasound at [facility] its 01/21/21 at 10am

## 2021-01-13 ENCOUNTER — Other Ambulatory Visit: Payer: Self-pay

## 2021-01-13 ENCOUNTER — Ambulatory Visit (INDEPENDENT_AMBULATORY_CARE_PROVIDER_SITE_OTHER): Payer: No Typology Code available for payment source | Admitting: Advanced Practice Midwife

## 2021-01-13 VITALS — BP 135/87 | HR 99 | Wt 163.0 lb

## 2021-01-13 DIAGNOSIS — Z3480 Encounter for supervision of other normal pregnancy, unspecified trimester: Secondary | ICD-10-CM

## 2021-01-13 DIAGNOSIS — O09299 Supervision of pregnancy with other poor reproductive or obstetric history, unspecified trimester: Secondary | ICD-10-CM

## 2021-01-13 DIAGNOSIS — O34219 Maternal care for unspecified type scar from previous cesarean delivery: Secondary | ICD-10-CM

## 2021-01-13 NOTE — Progress Notes (Signed)
   PRENATAL VISIT NOTE  Subjective:  Michelle Schultz is a 30 y.o. G2P1001 at [redacted]w[redacted]d being seen today for ongoing prenatal care.  She is currently monitored for the following issues for this high-risk pregnancy and has Previous cesarean delivery for failure to progress, antepartum; Rubella non-immune status, antepartum; Supervision of other normal pregnancy, antepartum; History of HELLP, currently pregnant; History of postpartum hemorrhage; and Proteinuria affecting pregnancy in first trimester on their problem list.  Patient reports no complaints.  Contractions: Not present. Vag. Bleeding: None.  Movement: Present. Denies leaking of fluid.   The following portions of the patient's history were reviewed and updated as appropriate: allergies, current medications, past family history, past medical history, past social history, past surgical history and problem list. Problem list updated.  Objective:   Vitals:   01/13/21 1439  BP: 135/87  Pulse: 99  Weight: 163 lb (73.9 kg)    Fetal Status: Fetal Heart Rate (bpm): 140   Movement: Present     General:  Alert, oriented and cooperative. Patient is in no acute distress.  Skin: Skin is warm and dry. No rash noted.   Cardiovascular: Normal heart rate noted  Respiratory: Normal respiratory effort, no problems with respiration noted  Abdomen: Soft, gravid, appropriate for gestational age.  Pain/Pressure: Absent     Pelvic: Cervical exam deferred        Extremities: Normal range of motion.  Edema: None  Mental Status: Normal mood and affect. Normal behavior. Normal judgment and thought content.   Assessment and Plan:  Pregnancy: G2P1001 at [redacted]w[redacted]d  1. Supervision of other normal pregnancy, antepartum - No complaints, AFP today - Difficulty with [employer] - BP on home cuff: 105/71, P= 81 on 09/07, 113/69 P= 87 on 08/29   2. Hx of preeclampsia, prior pregnancy, currently pregnant - P:Cr collected today per note from Dr. Shawnie Pons last visit.  -  Protein / creatinine ratio, urine   3. Previous cesarean delivery for failure to progress, antepartum - Discuss delivery method next visit  Preterm labor symptoms and general obstetric precautions including but not limited to vaginal bleeding, contractions, leaking of fluid and fetal movement were reviewed in detail with the patient. Please refer to After Visit Summary for other counseling recommendations.  Return in about 4 weeks (around 02/10/2021) for MD.  Future Appointments  Date Time Provider Department Center  01/21/2021 10:00 AM [facility]  02/11/2021  3:50 PM Anyanwu, Jethro Bastos, MD [facility]    Calvert Cantor, CNM

## 2021-01-18 ENCOUNTER — Other Ambulatory Visit: Payer: Self-pay | Admitting: Obstetrics & Gynecology

## 2021-01-18 DIAGNOSIS — O09292 Supervision of pregnancy with other poor reproductive or obstetric history, second trimester: Secondary | ICD-10-CM

## 2021-01-18 DIAGNOSIS — O34219 Maternal care for unspecified type scar from previous cesarean delivery: Secondary | ICD-10-CM

## 2021-01-20 LAB — PROTEIN / CREATININE RATIO, URINE
Creatinine, Urine: 21.1 mg/dL
Protein, Ur: 5.2 mg/dL
Protein/Creat Ratio: 246 mg/g creat — ABNORMAL HIGH (ref 0–200)

## 2021-01-20 LAB — AFP, SERUM, OPEN SPINA BIFIDA
AFP MoM: 0.9
AFP Value: 42 ng/mL
Gest. Age on Collection Date: 18.6 weeks
Maternal Age At EDD: 31.9 yr
OSBR Risk 1 IN: 10000
Test Results:: NEGATIVE
Weight: 163 [lb_av]

## 2021-01-21 ENCOUNTER — Other Ambulatory Visit: Payer: Self-pay

## 2021-01-21 ENCOUNTER — Ambulatory Visit: Payer: No Typology Code available for payment source | Attending: Obstetrics and Gynecology

## 2021-01-21 DIAGNOSIS — Z3A19 19 weeks gestation of pregnancy: Secondary | ICD-10-CM | POA: Diagnosis not present

## 2021-01-21 DIAGNOSIS — O09292 Supervision of pregnancy with other poor reproductive or obstetric history, second trimester: Secondary | ICD-10-CM | POA: Diagnosis not present

## 2021-01-21 DIAGNOSIS — Z3689 Encounter for other specified antenatal screening: Secondary | ICD-10-CM

## 2021-01-21 DIAGNOSIS — Z363 Encounter for antenatal screening for malformations: Secondary | ICD-10-CM | POA: Insufficient documentation

## 2021-01-21 DIAGNOSIS — O34219 Maternal care for unspecified type scar from previous cesarean delivery: Secondary | ICD-10-CM | POA: Insufficient documentation

## 2021-02-11 ENCOUNTER — Other Ambulatory Visit: Payer: Self-pay

## 2021-02-11 ENCOUNTER — Ambulatory Visit (INDEPENDENT_AMBULATORY_CARE_PROVIDER_SITE_OTHER): Payer: No Typology Code available for payment source | Admitting: Obstetrics & Gynecology

## 2021-02-11 ENCOUNTER — Encounter: Payer: Self-pay | Admitting: Obstetrics & Gynecology

## 2021-02-11 VITALS — BP 124/82 | HR 90 | Wt 166.0 lb

## 2021-02-11 DIAGNOSIS — Z3A22 22 weeks gestation of pregnancy: Secondary | ICD-10-CM

## 2021-02-11 DIAGNOSIS — O09299 Supervision of pregnancy with other poor reproductive or obstetric history, unspecified trimester: Secondary | ICD-10-CM

## 2021-02-11 DIAGNOSIS — O0992 Supervision of high risk pregnancy, unspecified, second trimester: Secondary | ICD-10-CM

## 2021-02-11 NOTE — Patient Instructions (Signed)
Return to office for any scheduled appointments. Call the office or go to the MAU at [facility] [place] if:  You begin to have strong, frequent contractions  Your water breaks.  Sometimes it is a big gush of fluid, sometimes it is just a trickle that keeps getting your panties wet or running down your legs  You have vaginal bleeding.  It is normal to have a small amount of spotting if your cervix was checked.   You do not feel your baby moving like normal.  If you do not, get something to eat and drink and lay down and focus on feeling your baby move.   If your baby is still not moving like normal, you should call the office or go to MAU.  Any other obstetric concerns.   

## 2021-02-11 NOTE — Progress Notes (Signed)
PRENATAL VISIT NOTE  Subjective:  Michelle Schultz is a 30 y.o. G2P1001 at [redacted]w[redacted]d being seen today for ongoing prenatal care.  She is currently monitored for the following issues for this high-risk pregnancy and has Previous cesarean delivery for failure to progress, antepartum; Rubella non-immune status, antepartum; Supervision of high-risk pregnancy; History of HELLP, currently pregnant; History of postpartum hemorrhage; and Proteinuria affecting pregnancy in first trimester on their problem list.  Patient reports no complaints.  Contractions: Not present. Vag. Bleeding: None.  Movement: Present. Denies leaking of fluid.   The following portions of the patient's history were reviewed and updated as appropriate: allergies, current medications, past family history, past medical history, past social history, past surgical history and problem list.   Objective:   Vitals:   02/11/21 1556  BP: 124/82  Pulse: 90  Weight: 166 lb (75.3 kg)    Fetal Status: Fetal Heart Rate (bpm): 144 Fundal Height: 23 cm Movement: Present     General:  Alert, oriented and cooperative. Patient is in no acute distress.  Skin: Skin is warm and dry. No rash noted.   Cardiovascular: Normal heart rate noted  Respiratory: Normal respiratory effort, no problems with respiration noted  Abdomen: Soft, gravid, appropriate for gestational age.  Pain/Pressure: Absent     Pelvic: Cervical exam deferred        Extremities: Normal range of motion.  Edema: None  Mental Status: Normal mood and affect. Normal behavior. Normal judgment and thought content.   Imaging: [place] MFM OB DETAIL +14 WK  Result Date: 01/21/2021 ----------------------------------------------------------------------  OBSTETRICS REPORT                       (Signed Final 01/21/2021 01:53 pm) ---------------------------------------------------------------------- Patient Info  ID #:       [id number]                          D.O.B.:  03-Sep-1990 (30 yrs)  Name:        Michelle Schultz                   Visit Date: 01/21/2021 10:16 am ---------------------------------------------------------------------- Performed By  Attending:        Noralee Space MD        Ref. Address:     [address]                                                             [address]  Performed By:     Lorn Junes,           [facility]                    RDMS, RDCS                               [facility]  Referred By:      [facility] ---------------------------------------------------------------------- Orders  #  Description                           Code        Ordered By  1  [place] MFM OB DETAIL +14 WK               [id number]    Jaynie Collins ----------------------------------------------------------------------  #  Order #                     Accession #                Episode #  1  [id number]                   [id number]                 [id number] ---------------------------------------------------------------------- Indications  Encounter for antenatal screening for          Z36.3  malformations  Poor obstetric history: Previous               O09.299  preeclampsia / eclampsia/gestational HTN  Previous cesarean delivery, antepartum         O34.219  [redacted] weeks gestation of pregnancy                Z3A.19 ---------------------------------------------------------------------- Fetal Evaluation  Num Of Fetuses:         1  Fetal Heart Rate(bpm):  141  Cardiac Activity:       Observed  Presentation:           Variable  Placenta:               Posterior  Amniotic Fluid  AFI FV:      Within normal limits                              Largest Pocket(cm)                              4.3 ---------------------------------------------------------------------- Biometry  BPD:      45.7  mm     G. Age:  19w 6d         55  %    CI:        69.36   %    70 - 86                                                           FL/HC:      16.7   %    16.8 - 19.8  HC:      175.2  mm     G. Age:  20w 0d         58  %    HC/AC:      1.21        1.09 - 1.39  AC:      144.6  mm     G. Age:  19w 5d         47  %    FL/BPD:  64.1   %  FL:       29.3  mm     G. Age:  19w 0d         20  %    FL/AC:      20.3   %    20 - 24  CER:        21  mm     G. Age:  20w 0d         75  %  NFT:       5.7  mm  LV:        5.4  mm  CM:        3.9  mm  Est. FW:     296  gm    0 lb 10 oz      33  % ---------------------------------------------------------------------- OB History  Gravidity:    2         Term:   1  Living:       1 ---------------------------------------------------------------------- Gestational Age  LMP:           19w 5d        Date:  09/05/20                 EDD:   06/12/21  U/S Today:     19w 5d                                        EDD:   06/12/21  Best:          19w 5d     Det. By:  LMP  (09/05/20)          EDD:   06/12/21 ---------------------------------------------------------------------- Anatomy  Cranium:               Appears normal         Aortic Arch:            Appears normal  Cavum:                 Appears normal         Ductal Arch:            Appears normal  Ventricles:            Appears normal         Diaphragm:              Appears normal  Choroid Plexus:        Appears normal         Stomach:                Appears normal, left                                                                        sided  Cerebellum:            Appears normal         Abdomen:                Appears normal  Posterior Fossa:       Appears normal  Abdominal Wall:         Appears nml (cord                                                                        insert, abd wall)  Nuchal Fold:           Appears normal         Cord Vessels:           Appears normal (3                                                                        vessel cord)  Face:                  Appears normal         Kidneys:                Appear normal                          (orbits and profile)  Lips:                  Appears normal         Bladder:                Appears normal  Thoracic:              Appears normal         Spine:                  Appears normal  Heart:                 Appears normal         Upper Extremities:      Appears normal                         (4CH, axis, and                         situs)  RVOT:                  Appears normal         Lower Extremities:      Appears normal  LVOT:                  Appears normal  Other:  Fetus appears to be female. ---------------------------------------------------------------------- Targeted Anatomy  Thorax  SVC:                   Appears normal         3 V Trachea View:       Appears normal  3 Vessel View:         Appears normal         IVC:  Appears normal ---------------------------------------------------------------------- Cervix Uterus Adnexa  Cervix  Length:           3.85  cm.  Normal appearance by transabdominal scan.  Right Ovary  Within normal limits.  Left Ovary  Within normal limits. ---------------------------------------------------------------------- Impression  G2 P1. Patient is here for fetal anatomy scan. On cell-free  fetal DNA screening, the risks of aneuploidies are not  increased. MSAFP screening showed low risk for open-neural  tube defects.  Obstetrical history significant for a term cesarean delivery 4  years ago of a female infant weighing 4000 g at birth. Her  pregnancy was complicated by preeclampsia.  She takes low-dose aspirin prophylaxis.  Patient reports no chronic medical conditions.  We performed a fetal anatomy scan. No markers of  aneuploidies or fetal structural defects are seen. Fetal  biometry is consistent with her previously-established dates.  Amniotic fluid is normal and good fetal activity is seen.  Patient understands the limitations of ultrasound in detecting  fetal anomalies.  Placenta is anterior and there is no evidence of previa or  placenta  accrete spectrum. ---------------------------------------------------------------------- Recommendations  -An appointment was made for her to return in 8 weeks for  fetal growth assessment. ----------------------------------------------------------------------                  Noralee Space, MD Electronically Signed Final Report   01/21/2021 01:53 pm ----------------------------------------------------------------------   Assessment and Plan:  Pregnancy: G2P1001 at [redacted]w[redacted]d 1. History of HELLP, currently pregnant Stable BP, continue to monitor  2. [redacted] weeks gestation of pregnancy 3. Supervision of high risk pregnancy in second trimester Preterm labor symptoms and general obstetric precautions including but not limited to vaginal bleeding, contractions, leaking of fluid and fetal movement were reviewed in detail with the patient. Please refer to After Visit Summary for other counseling recommendations.   Return in about 5 weeks (around 03/18/2021) for 2 hr GTT, 3rd trimester labs, TDap, OFFICE OB VISIT (MD only).  Future Appointments  Date Time Provider Department Center  03/17/2021  8:30 AM Reva Bores, MD [facility]  03/18/2021  9:00 AM [facility]    Jaynie Collins, MD

## 2021-03-16 ENCOUNTER — Other Ambulatory Visit: Payer: Self-pay

## 2021-03-16 DIAGNOSIS — O0990 Supervision of high risk pregnancy, unspecified, unspecified trimester: Secondary | ICD-10-CM

## 2021-03-16 DIAGNOSIS — Z8759 Personal history of other complications of pregnancy, childbirth and the puerperium: Secondary | ICD-10-CM

## 2021-03-16 NOTE — Progress Notes (Incomplete)
follow

## 2021-03-17 ENCOUNTER — Ambulatory Visit (INDEPENDENT_AMBULATORY_CARE_PROVIDER_SITE_OTHER): Payer: No Typology Code available for payment source | Admitting: Family Medicine

## 2021-03-17 ENCOUNTER — Other Ambulatory Visit: Payer: Self-pay

## 2021-03-17 VITALS — BP 106/74 | HR 97 | Wt 166.0 lb

## 2021-03-17 DIAGNOSIS — O09292 Supervision of pregnancy with other poor reproductive or obstetric history, second trimester: Secondary | ICD-10-CM

## 2021-03-17 DIAGNOSIS — O34219 Maternal care for unspecified type scar from previous cesarean delivery: Secondary | ICD-10-CM

## 2021-03-17 DIAGNOSIS — O1211 Gestational proteinuria, first trimester: Secondary | ICD-10-CM

## 2021-03-17 DIAGNOSIS — O09899 Supervision of other high risk pregnancies, unspecified trimester: Secondary | ICD-10-CM

## 2021-03-17 DIAGNOSIS — Z23 Encounter for immunization: Secondary | ICD-10-CM

## 2021-03-17 DIAGNOSIS — O1212 Gestational proteinuria, second trimester: Secondary | ICD-10-CM

## 2021-03-17 DIAGNOSIS — O34212 Maternal care for vertical scar from previous cesarean delivery: Secondary | ICD-10-CM

## 2021-03-17 DIAGNOSIS — O0992 Supervision of high risk pregnancy, unspecified, second trimester: Secondary | ICD-10-CM

## 2021-03-17 DIAGNOSIS — O09892 Supervision of other high risk pregnancies, second trimester: Secondary | ICD-10-CM

## 2021-03-17 DIAGNOSIS — Z2839 Other underimmunization status: Secondary | ICD-10-CM

## 2021-03-17 DIAGNOSIS — O09299 Supervision of pregnancy with other poor reproductive or obstetric history, unspecified trimester: Secondary | ICD-10-CM

## 2021-03-17 DIAGNOSIS — Z3A27 27 weeks gestation of pregnancy: Secondary | ICD-10-CM

## 2021-03-17 LAB — CBC
Hematocrit: 31.8 % — ABNORMAL LOW (ref 34.0–46.6)
Hemoglobin: 11.2 g/dL (ref 11.1–15.9)
MCH: 30.6 pg (ref 26.6–33.0)
MCHC: 35.2 g/dL (ref 31.5–35.7)
MCV: 87 fL (ref 79–97)
Platelets: 298 10*3/uL (ref 150–450)
RBC: 3.66 x10E6/uL — ABNORMAL LOW (ref 3.77–5.28)
RDW: 11.8 % (ref 11.7–15.4)
WBC: 7.1 10*3/uL (ref 3.4–10.8)

## 2021-03-17 NOTE — Progress Notes (Signed)
   PRENATAL VISIT NOTE  Subjective:  Michelle Schultz is a 30 y.o. G2P1001 at [redacted]w[redacted]d being seen today for ongoing prenatal care.  She is currently monitored for the following issues for this high-risk pregnancy and has Previous cesarean delivery for failure to progress, antepartum; Rubella non-immune status, antepartum; Supervision of high-risk pregnancy; History of HELLP, currently pregnant; History of postpartum hemorrhage; and Proteinuria affecting pregnancy in first trimester on their problem list.  Patient reports no complaints.  Contractions: Not present. Vag. Bleeding: None.  Movement: Present. Denies leaking of fluid.   The following portions of the patient's history were reviewed and updated as appropriate: allergies, current medications, past family history, past medical history, past social history, past surgical history and problem list.   Objective:   Vitals:   03/17/21 0843  BP: 106/74  Pulse: 97  Weight: 166 lb (75.3 kg)    Fetal Status: Fetal Heart Rate (bpm): 146 Fundal Height: 28 cm Movement: Present     General:  Alert, oriented and cooperative. Patient is in no acute distress.  Skin: Skin is warm and dry. No rash noted.   Cardiovascular: Normal heart rate noted  Respiratory: Normal respiratory effort, no problems with respiration noted  Abdomen: Soft, gravid, appropriate for gestational age.  Pain/Pressure: Absent     Pelvic: Cervical exam deferred        Extremities: Normal range of motion.  Edema: None  Mental Status: Normal mood and affect. Normal behavior. Normal judgment and thought content.   Assessment and Plan:  Pregnancy: G2P1001 at [redacted]w[redacted]d 1. Supervision of high risk pregnancy in second trimester 28 wk labs and TDaP today - Glucose Tolerance, 2 Hours w/1 Hour - CBC - RPR - HIV Antibody (routine testing w rflx)  2. Previous cesarean delivery for failure to progress, antepartum Desires RCS--message sent to scheduler  3. Rubella non-immune status,  antepartum Needs MMR pp  4. Proteinuria affecting pregnancy in first trimester resolved  5. History of HELLP, currently pregnant On ASA--BP is ok today Has f/u growth scheduled  Preterm labor symptoms and general obstetric precautions including but not limited to vaginal bleeding, contractions, leaking of fluid and fetal movement were reviewed in detail with the patient. Please refer to After Visit Summary for other counseling recommendations.   Return in 4 weeks (on 04/14/2021).  Future Appointments  Date Time Provider [place]  03/18/2021  9:00 AM [facility]    Donnamae Jude, MD

## 2021-03-17 NOTE — Patient Instructions (Signed)

## 2021-03-18 ENCOUNTER — Other Ambulatory Visit: Payer: Self-pay

## 2021-03-18 ENCOUNTER — Ambulatory Visit (HOSPITAL_BASED_OUTPATIENT_CLINIC_OR_DEPARTMENT_OTHER): Payer: No Typology Code available for payment source | Admitting: Maternal & Fetal Medicine

## 2021-03-18 ENCOUNTER — Ambulatory Visit: Payer: No Typology Code available for payment source | Attending: Maternal & Fetal Medicine

## 2021-03-18 ENCOUNTER — Encounter: Payer: Self-pay | Admitting: Family Medicine

## 2021-03-18 VITALS — BP 122/82 | HR 109 | Temp 97.8°F | Ht 61.0 in | Wt 166.5 lb

## 2021-03-18 DIAGNOSIS — Z3A27 27 weeks gestation of pregnancy: Secondary | ICD-10-CM | POA: Diagnosis not present

## 2021-03-18 DIAGNOSIS — O24419 Gestational diabetes mellitus in pregnancy, unspecified control: Secondary | ICD-10-CM | POA: Insufficient documentation

## 2021-03-18 DIAGNOSIS — O0992 Supervision of high risk pregnancy, unspecified, second trimester: Secondary | ICD-10-CM | POA: Diagnosis not present

## 2021-03-18 DIAGNOSIS — O34219 Maternal care for unspecified type scar from previous cesarean delivery: Secondary | ICD-10-CM | POA: Insufficient documentation

## 2021-03-18 DIAGNOSIS — O0990 Supervision of high risk pregnancy, unspecified, unspecified trimester: Secondary | ICD-10-CM

## 2021-03-18 DIAGNOSIS — Z8632 Personal history of gestational diabetes: Secondary | ICD-10-CM | POA: Insufficient documentation

## 2021-03-18 DIAGNOSIS — O09292 Supervision of pregnancy with other poor reproductive or obstetric history, second trimester: Secondary | ICD-10-CM | POA: Diagnosis not present

## 2021-03-18 DIAGNOSIS — Z363 Encounter for antenatal screening for malformations: Secondary | ICD-10-CM | POA: Diagnosis not present

## 2021-03-18 DIAGNOSIS — N858 Other specified noninflammatory disorders of uterus: Secondary | ICD-10-CM | POA: Insufficient documentation

## 2021-03-18 DIAGNOSIS — Z8759 Personal history of other complications of pregnancy, childbirth and the puerperium: Secondary | ICD-10-CM | POA: Insufficient documentation

## 2021-03-18 DIAGNOSIS — O09299 Supervision of pregnancy with other poor reproductive or obstetric history, unspecified trimester: Secondary | ICD-10-CM

## 2021-03-18 DIAGNOSIS — O1211 Gestational proteinuria, first trimester: Secondary | ICD-10-CM

## 2021-03-18 HISTORY — DX: Gestational diabetes mellitus in pregnancy, unspecified control: O24.419

## 2021-03-18 LAB — GLUCOSE TOLERANCE, 2 HOURS W/ 1HR
Glucose, 1 hour: 228 mg/dL — ABNORMAL HIGH (ref 70–179)
Glucose, 2 hour: 199 mg/dL — ABNORMAL HIGH (ref 70–152)
Glucose, Fasting: 93 mg/dL — ABNORMAL HIGH (ref 70–91)

## 2021-03-18 LAB — HIV ANTIBODY (ROUTINE TESTING W REFLEX): HIV Screen 4th Generation wRfx: NONREACTIVE

## 2021-03-18 LAB — RPR: RPR Ser Ql: NONREACTIVE

## 2021-03-18 MED ORDER — ACCU-CHEK GUIDE W/DEVICE KIT: 1.0000 | PACK | Freq: Four times a day (QID) | 0 refills | Status: DC

## 2021-03-18 MED ORDER — ACCU-CHEK SOFTCLIX LANCETS MISC: 1.0000 | Freq: Four times a day (QID) | 12 refills | Status: DC

## 2021-03-18 MED ORDER — ACCU-CHEK GUIDE VI STRP
ORAL_STRIP | 12 refills | Status: DC
Start: 2021-03-18 — End: 2021-04-14

## 2021-03-18 NOTE — Progress Notes (Signed)
Michelle Schultz is a 30 yo G2P1 who is here for follow up growth at the request of Dr. Tinnie Gens.  Michelle Schultz has a prior history of 4000g fetus and preeclampsia. Normal interval growth with measurements consistent with dates Good fetal movement and amniotic fluid volume   Interval pregnancy changes include new diagnosis of gestational diabetes. She learned of her results this morning thus diabetic teaching has not been performed.  I discussed the diagnosis, risk and management of gestational diabetes. We discussed complications can include fetal macrosomia, cesarean delivery, maternal or fetal birth trauma, neonatal hypoglycemia with NICU admission and stillbirth. I retierated that these complications are often found in an unmanaged and unmonitored pregnancy.   We discuss the mainstay of treatment include nutrition, exercise and medical therapy if needed. We reviewed basic nutrition goals and the physiology of exercise on glucose management.   In addition, we discussed the goal of FBS 60-90 and 2hr PP < 120 mg/dL.   Lastly, we recommend serial growth exams every 4 weeks with plan for delivery between 37-39 weeks pending fetal testing and maternal blood glucose control. If medical therapy is initiated we recommend weekly to twice weekly testing at 32 week.  Michelle Schultz and her significant other expressed an understanding of our time together.  She is scheduled to return in 4-6 weeks  All questions answered.  I spent 30 minutes with > 50% in face to face consultation.   Damaris Geers J. Kathya Wilz,MD

## 2021-03-23 ENCOUNTER — Ambulatory Visit: Payer: No Typology Code available for payment source | Admitting: Registered"

## 2021-03-23 ENCOUNTER — Other Ambulatory Visit: Payer: Self-pay

## 2021-03-23 ENCOUNTER — Encounter: Payer: No Typology Code available for payment source | Attending: Family Medicine | Admitting: Registered"

## 2021-03-23 DIAGNOSIS — O24419 Gestational diabetes mellitus in pregnancy, unspecified control: Secondary | ICD-10-CM

## 2021-03-23 NOTE — Progress Notes (Signed)
Patient was seen for Gestational Diabetes self-management on 03/23/21  Start time 1422 and End time 1516   Estimated due date: 06/12/21; [redacted]w[redacted]d  Clinical: Medications: reviewed Medical History: revewed Labs: OGTT [phone], A1c 5.3% initial visit 11/19/20   Dietary and Lifestyle History: Patient at visit with spouse. Patient states she felt she has a fairly healthy diet prior to pregnancy. The week prior to the OGTT was having to rely on fast food often, but is not typical for her.  Physical Activity: ADLs, but was previously active, was playing tennis until recently Stress: not assessed Sleep: "plenty"  24 hr Recall:  First Meal: eggs, spinach OR avocado toast Snack: variety of things such as fruit, popcorn, mixed nuts Second meal: chicken wrap with sweet potatoes Snack: Third meal: chicken, asparagus, starch Snack: Beverages: water (~100 oz or more), coffee w/ ~2 Tbs sugar free creamer  NUTRITION INTERVENTION  Nutrition education (E-1) on the following topics:   Initial Follow-up  [x]  []  Definition of Gestational Diabetes [x]  []  Why dietary management is important in controlling blood glucose [x]  []  Effects each nutrient has on blood glucose levels [x]  []  Simple carbohydrates vs complex carbohydrates [x]  []  Fluid intake [x]  []  Creating a balanced meal plan [x]  []  Carbohydrate counting  [x]  []  When to check blood glucose levels [x]  []  Proper blood glucose monitoring techniques [x]  []  Effect of stress and stress reduction techniques  [x]  []  Exercise effect on blood glucose levels, appropriate exercise during pregnancy [x]  []  Importance of limiting caffeine and abstaining from alcohol and smoking [x]  []  Medications used for blood sugar control during pregnancy [x]  []  Hypoglycemia and rule of 15 [x]  []  Postpartum self care  Patient already has a meter, is not testing pre breakfast and 2 hours after each meal. Taught Patient to use meter during visit Postprandial: 117  mg/dL ( min after lunch)  Patient instructed to monitor glucose levels: FBS: 60 - ? 95 mg/dL (some clinics use 90 for cutoff) 1 hour: ? 140 mg/dL 2 hour: ? mg/dL  Patient received handouts: Nutrition Diabetes and Pregnancy Carbohydrate Counting List Glucose log sheet  Patient will be seen for follow-up as needed.

## 2021-03-29 ENCOUNTER — Other Ambulatory Visit: Payer: Self-pay

## 2021-04-14 ENCOUNTER — Ambulatory Visit (INDEPENDENT_AMBULATORY_CARE_PROVIDER_SITE_OTHER): Payer: No Typology Code available for payment source | Admitting: Family Medicine

## 2021-04-14 ENCOUNTER — Other Ambulatory Visit: Payer: Self-pay

## 2021-04-14 VITALS — BP 134/87 | HR 101 | Wt 167.0 lb

## 2021-04-14 DIAGNOSIS — O24419 Gestational diabetes mellitus in pregnancy, unspecified control: Secondary | ICD-10-CM

## 2021-04-14 DIAGNOSIS — O34219 Maternal care for unspecified type scar from previous cesarean delivery: Secondary | ICD-10-CM

## 2021-04-14 DIAGNOSIS — O09899 Supervision of other high risk pregnancies, unspecified trimester: Secondary | ICD-10-CM

## 2021-04-14 DIAGNOSIS — O0993 Supervision of high risk pregnancy, unspecified, third trimester: Secondary | ICD-10-CM

## 2021-04-14 DIAGNOSIS — Z2839 Other underimmunization status: Secondary | ICD-10-CM

## 2021-04-14 DIAGNOSIS — O09299 Supervision of pregnancy with other poor reproductive or obstetric history, unspecified trimester: Secondary | ICD-10-CM

## 2021-04-14 DIAGNOSIS — Z8759 Personal history of other complications of pregnancy, childbirth and the puerperium: Secondary | ICD-10-CM

## 2021-04-14 MED ORDER — ACCU-CHEK GUIDE VI STRP: ORAL_STRIP | 12 refills | Status: DC

## 2021-04-14 NOTE — Progress Notes (Signed)
° °  PRENATAL VISIT NOTE  Subjective:  Michelle Schultz is a 30 y.o. G2P1001 at 58w4dbeing seen today for ongoing prenatal care.  She is currently monitored for the following issues for this high-risk pregnancy and has Previous cesarean delivery for failure to progress, antepartum; Rubella non-immune status, antepartum; Supervision of high-risk pregnancy; History of HELLP, currently pregnant; History of postpartum hemorrhage; Proteinuria affecting pregnancy in first trimester; and Gestational diabetes on their problem list.  Patient reports no complaints.  Contractions: Not present. Vag. Bleeding: None.  Movement: Present. Denies leaking of fluid.   The following portions of the patient's history were reviewed and updated as appropriate: allergies, current medications, past family history, past medical history, past social history, past surgical history and problem list.   Objective:   Vitals:   04/14/21 1453 04/14/21 1458  BP: (!) 143/87 134/87  Pulse: (!) 114 (!) 101  Weight: 167 lb (75.8 kg)     Fetal Status: Fetal Heart Rate (bpm): 141   Movement: Present     General:  Alert, oriented and cooperative. Patient is in no acute distress.  Skin: Skin is warm and dry. No rash noted.   Cardiovascular: Normal heart rate noted  Respiratory: Normal respiratory effort, no problems with respiration noted  Abdomen: Soft, gravid, appropriate for gestational age.  Pain/Pressure: Absent     Pelvic: Cervical exam deferred        Extremities: Normal range of motion.  Edema: None  Mental Status: Normal mood and affect. Normal behavior. Normal judgment and thought content.   Assessment and Plan:  Pregnancy: G2P1001 at [redacted]w[redacted]d. Gestational diabetes mellitus (GDM) in third trimester, gestational diabetes method of control unspecified  Excellent glycemic control - glucose blood (ACCU-CHEK GUIDE) test strip; Use to check blood sugars four times a day was instructed  Dispense: 100 each; Refill: 12  2.  Previous cesarean delivery for failure to progress, antepartum Scheduled for RCS  3. Rubella non-immune status, antepartum Needs MMR pp  4. Supervision of high risk pregnancy in third trimester   5. History of HELLP, currently pregnant Mildly elevated BP x 1, no symptoms, came down--has cuff at home. See BP readings on photo--has been normal. If remains elevated, let usKoreanow.  6. History of postpartum hemorrhage   Preterm labor symptoms and general obstetric precautions including but not limited to vaginal bleeding, contractions, leaking of fluid and fetal movement were reviewed in detail with the patient. Please refer to After Visit Summary for other counseling recommendations.   Return in 2 weeks (on 04/28/2021).  Future Appointments  Date Time Provider [place]12/22/2022 10:00 AM [facility]  2:30 PM PrDonnamae JudeMD [facility]  05/26/2021  1:50 PM PrDonnamae JudeMD [facility]  06/02/2021  1:50 PM PrDonnamae JudeMD [facility]  06/09/2021  1:50 PM AnHarolyn RutherfordUgSallyanne HaversMD [facility]  06/15/2021 11:15 AM PrDonnamae JudeMD [facility]    TaDonnamae JudeMD

## 2021-04-19 ENCOUNTER — Other Ambulatory Visit: Payer: Self-pay

## 2021-04-19 DIAGNOSIS — O34219 Maternal care for unspecified type scar from previous cesarean delivery: Secondary | ICD-10-CM

## 2021-04-19 DIAGNOSIS — O09299 Supervision of pregnancy with other poor reproductive or obstetric history, unspecified trimester: Secondary | ICD-10-CM

## 2021-04-19 DIAGNOSIS — Z8759 Personal history of other complications of pregnancy, childbirth and the puerperium: Secondary | ICD-10-CM

## 2021-04-19 DIAGNOSIS — O24419 Gestational diabetes mellitus in pregnancy, unspecified control: Secondary | ICD-10-CM

## 2021-04-22 ENCOUNTER — Other Ambulatory Visit: Payer: Self-pay

## 2021-04-22 ENCOUNTER — Ambulatory Visit: Payer: No Typology Code available for payment source | Attending: Maternal & Fetal Medicine

## 2021-04-22 ENCOUNTER — Ambulatory Visit (HOSPITAL_BASED_OUTPATIENT_CLINIC_OR_DEPARTMENT_OTHER): Payer: No Typology Code available for payment source | Admitting: Maternal & Fetal Medicine

## 2021-04-22 VITALS — BP 128/88 | HR 99 | Temp 98.0°F | Ht 61.0 in | Wt 168.0 lb

## 2021-04-22 DIAGNOSIS — O09293 Supervision of pregnancy with other poor reproductive or obstetric history, third trimester: Secondary | ICD-10-CM | POA: Diagnosis not present

## 2021-04-22 DIAGNOSIS — Z3A32 32 weeks gestation of pregnancy: Secondary | ICD-10-CM | POA: Diagnosis not present

## 2021-04-22 DIAGNOSIS — O24419 Gestational diabetes mellitus in pregnancy, unspecified control: Secondary | ICD-10-CM

## 2021-04-22 DIAGNOSIS — Z362 Encounter for other antenatal screening follow-up: Secondary | ICD-10-CM | POA: Diagnosis not present

## 2021-04-22 DIAGNOSIS — O34219 Maternal care for unspecified type scar from previous cesarean delivery: Secondary | ICD-10-CM

## 2021-04-22 DIAGNOSIS — O0993 Supervision of high risk pregnancy, unspecified, third trimester: Secondary | ICD-10-CM

## 2021-04-22 DIAGNOSIS — O09299 Supervision of pregnancy with other poor reproductive or obstetric history, unspecified trimester: Secondary | ICD-10-CM

## 2021-04-22 DIAGNOSIS — Z8759 Personal history of other complications of pregnancy, childbirth and the puerperium: Secondary | ICD-10-CM

## 2021-04-22 DIAGNOSIS — O1211 Gestational proteinuria, first trimester: Secondary | ICD-10-CM

## 2021-04-22 DIAGNOSIS — O24410 Gestational diabetes mellitus in pregnancy, diet controlled: Secondary | ICD-10-CM

## 2021-04-22 NOTE — Progress Notes (Signed)
Michelle Schultz is a 30 yo G2P1 who is here at 22 w 5d for a follow up growth due to A1GDM and review of her blood sugars.  She is seen at the request of Tinnie Gens, MD  Normal interval growth with measurements consistent with dates Good fetal movement and amniotic fluid volume   I reviewed Michelle Schultz blood sugars. she has 8 of 12 elevated right around 90-94 on average. We reviewed her diet and exercise habits. She will begin riding her bike at night and continue a balanced carb dinner.  She will report her blood sugars in 1 week.  If her FBS remain elevated I recommend we start metformin at night 500 mg.  All questions answered.  I spent 20 minutes with > 50% in direct consultation.  Novella Olive, MD

## 2021-04-27 ENCOUNTER — Ambulatory Visit (INDEPENDENT_AMBULATORY_CARE_PROVIDER_SITE_OTHER): Payer: No Typology Code available for payment source | Admitting: Obstetrics and Gynecology

## 2021-04-27 ENCOUNTER — Other Ambulatory Visit: Payer: Self-pay

## 2021-04-27 VITALS — BP 129/84 | HR 102 | Wt 168.4 lb

## 2021-04-27 DIAGNOSIS — Z3A33 33 weeks gestation of pregnancy: Secondary | ICD-10-CM

## 2021-04-27 DIAGNOSIS — O24410 Gestational diabetes mellitus in pregnancy, diet controlled: Secondary | ICD-10-CM

## 2021-04-27 DIAGNOSIS — O09299 Supervision of pregnancy with other poor reproductive or obstetric history, unspecified trimester: Secondary | ICD-10-CM

## 2021-04-27 DIAGNOSIS — O132 Gestational [pregnancy-induced] hypertension without significant proteinuria, second trimester: Secondary | ICD-10-CM | POA: Insufficient documentation

## 2021-04-27 DIAGNOSIS — O133 Gestational [pregnancy-induced] hypertension without significant proteinuria, third trimester: Secondary | ICD-10-CM

## 2021-04-27 DIAGNOSIS — O0993 Supervision of high risk pregnancy, unspecified, third trimester: Secondary | ICD-10-CM

## 2021-04-27 DIAGNOSIS — O34219 Maternal care for unspecified type scar from previous cesarean delivery: Secondary | ICD-10-CM

## 2021-04-27 NOTE — Progress Notes (Signed)
° ° ° °  PRENATAL VISIT NOTE  Subjective:  Michelle Schultz is a 30 y.o. G2P1001 at [redacted]w[redacted]d being seen today for ongoing prenatal care.  She is currently monitored for the following issues for this high-risk pregnancy and has Previous cesarean delivery for failure to progress, antepartum; Rubella non-immune status, antepartum; Supervision of high-risk pregnancy; History of HELLP, currently pregnant; History of postpartum hemorrhage; Gestational diabetes; and Transient hypertension of pregnancy in third trimester on their problem list.  Patient reports no complaints.  Contractions: Not present. Vag. Bleeding: None.  Movement: Present. Denies leaking of fluid.   The following portions of the patient's history were reviewed and updated as appropriate: allergies, current medications, past family history, past medical history, past social history, past surgical history and problem list.   Objective:   Vitals:   04/27/21 1404  BP: 129/84  Pulse: (!) 102  Weight: 168 lb 6.4 oz (76.4 kg)    Fetal Status: Fetal Heart Rate (bpm): 146   Movement: Present     General:  Alert, oriented and cooperative. Patient is in no acute distress.  Skin: Skin is warm and dry. No rash noted.   Cardiovascular: Normal heart rate noted  Respiratory: Normal respiratory effort, no problems with respiration noted  Abdomen: Soft, gravid, appropriate for gestational age.  Pain/Pressure: Absent     Pelvic: Cervical exam deferred        Extremities: Normal range of motion.  Edema: None  Mental Status: Normal mood and affect. Normal behavior. Normal judgment and thought content.   Assessment and Plan:  Pregnancy: G2P1001 at [redacted]w[redacted]d 1. Supervision of high risk pregnancy in third trimester Gbs maybe nv  2. [redacted] weeks gestation of pregnancy  3. Previous cesarean delivery for failure to progress, antepartum Scheduled for 2/4 rpt  4. History of HELLP, currently pregnant  5. Transient hypertension of pregnancy in third  trimester Normal today  6. GDMa1 Rare 2h PP that are slightly high but pt makes diet annotations for eating out. Pt to work on. Continue with diet control for now. 12/22: afi 14, ceph, 52%, 2118gm, ac 82% Pt has rpt u/s on 1/26 and pt wondering if she needs this. I told her if diet controlled at nv and FH is normal then okay to cancel.   Preterm labor symptoms and general obstetric precautions including but not limited to vaginal bleeding, contractions, leaking of fluid and fetal movement were reviewed in detail with the patient. Please refer to After Visit Summary for other counseling recommendations.   Return in about 2 weeks (around 05/11/2021) for low risk ob, in person, md or app.  Future Appointments  Date Time Provider Department Center  05/19/2021  2:30 PM Reva Bores, MD [facility]  05/26/2021  1:50 PM Reva Bores, MD [facility]  05/27/2021  4:00 PM [facility]  06/02/2021  1:50 PM Reva Bores, MD [facility]  06/09/2021  1:50 PM Anyanwu, Jethro Bastos, MD [facility]  06/15/2021 11:15 AM Reva Bores, MD [facility]    Sunset Bing, MD

## 2021-05-19 ENCOUNTER — Other Ambulatory Visit: Payer: Self-pay

## 2021-05-19 ENCOUNTER — Ambulatory Visit (INDEPENDENT_AMBULATORY_CARE_PROVIDER_SITE_OTHER): Payer: No Typology Code available for payment source | Admitting: Family Medicine

## 2021-05-19 ENCOUNTER — Other Ambulatory Visit (HOSPITAL_COMMUNITY)
Admission: RE | Admit: 2021-05-19 | Discharge: 2021-05-19 | Disposition: A | Discharge status: Home / Self Care | Payer: No Typology Code available for payment source | Source: Ambulatory Visit | Attending: Family Medicine | Admitting: Family Medicine

## 2021-05-19 VITALS — BP 131/91 | HR 88 | Wt 171.0 lb

## 2021-05-19 DIAGNOSIS — O34219 Maternal care for unspecified type scar from previous cesarean delivery: Secondary | ICD-10-CM

## 2021-05-19 DIAGNOSIS — Z3490 Encounter for supervision of normal pregnancy, unspecified, unspecified trimester: Secondary | ICD-10-CM

## 2021-05-19 DIAGNOSIS — Z2839 Other underimmunization status: Secondary | ICD-10-CM

## 2021-05-19 DIAGNOSIS — O0993 Supervision of high risk pregnancy, unspecified, third trimester: Secondary | ICD-10-CM

## 2021-05-19 DIAGNOSIS — O24410 Gestational diabetes mellitus in pregnancy, diet controlled: Secondary | ICD-10-CM

## 2021-05-19 DIAGNOSIS — O133 Gestational [pregnancy-induced] hypertension without significant proteinuria, third trimester: Secondary | ICD-10-CM

## 2021-05-19 DIAGNOSIS — O09899 Supervision of other high risk pregnancies, unspecified trimester: Secondary | ICD-10-CM

## 2021-05-19 NOTE — Progress Notes (Signed)
° °  PRENATAL VISIT NOTE  Subjective:  Adali Pennings is a 31 y.o. G2P1001 at [redacted]w[redacted]d being seen today for ongoing prenatal care.  She is currently monitored for the following issues for this low-risk pregnancy and has Previous cesarean delivery for failure to progress, antepartum; Rubella non-immune status, antepartum; Supervision of high-risk pregnancy; History of HELLP, currently pregnant; History of postpartum hemorrhage; Gestational diabetes; and Transient hypertension of pregnancy in third trimester on their problem list.  Patient reports no complaints.  Contractions: Not present. Vag. Bleeding: None.  Movement: Present. Denies leaking of fluid.   The following portions of the patient's history were reviewed and updated as appropriate: allergies, current medications, past family history, past medical history, past social history, past surgical history and problem list.   Objective:   Vitals:   05/19/21 1442 05/19/21 1510  BP: (!) 142/92 (!) 131/91  Pulse: 92 88  Weight: 171 lb (77.6 kg)     Fetal Status: Fetal Heart Rate (bpm): 145 Fundal Height: 33 cm Movement: Present  Presentation: Vertex  General:  Alert, oriented and cooperative. Patient is in no acute distress.  Skin: Skin is warm and dry. No rash noted.   Cardiovascular: Normal heart rate noted  Respiratory: Normal respiratory effort, no problems with respiration noted  Abdomen: Soft, gravid, appropriate for gestational age.  Pain/Pressure: Present     Pelvic: Cervical exam deferred Dilation: Closed Effacement (%): 30 Station: -3  Extremities: Normal range of motion.  Edema: None  Mental Status: Normal mood and affect. Normal behavior. Normal judgment and thought content.   Assessment and Plan:  Pregnancy: G2P1001 at [redacted]w[redacted]d 1. Diet controlled gestational diabetes mellitus (GDM) in third trimester    CBGs are well controlled on diet, last growth, WNL  2. Transient hypertension of pregnancy in third trimester Check  labs--she will monitor at home Her BP at home was 103/73 - Protein / creatinine ratio, urine - CBC - Comprehensive metabolic panel  3. Previous cesarean delivery for failure to progress, antepartum For RCS  4. Rubella non-immune status, antepartum Needs MMR pp  5. Supervision of high risk pregnancy in third trimester Cultures today - Cervicovaginal ancillary only( [place]) - Culture, beta strep (group b only)  Preterm labor symptoms and general obstetric precautions including but not limited to vaginal bleeding, contractions, leaking of fluid and fetal movement were reviewed in detail with the patient. Please refer to After Visit Summary for other counseling recommendations.   Return in 1 week (on 05/26/2021).  Future Appointments  Date Time Provider [place]  05/26/2021  1:50 PM Donnamae Jude, MD [facility]  05/27/2021  4:00 PM [facility]  06/02/2021  1:50 PM Donnamae Jude, MD [facility]  06/09/2021  1:50 PM Anyanwu, Sallyanne Havers, MD [facility]  06/15/2021 11:15 AM Donnamae Jude, MD [facility]    Donnamae Jude, MD

## 2021-05-19 NOTE — Progress Notes (Signed)
Pt denies headache and/or visual changes Pt does have blood sugar log with her

## 2021-05-19 NOTE — Patient Instructions (Signed)
Preeclampsia and Eclampsia  Preeclampsia is a serious condition that may develop during pregnancy. This condition involves high blood pressure during pregnancy and causes symptoms such as headaches, vision changes, and increased swelling in the legs, hands, and face. Preeclampsia occurs after 20 weeks of pregnancy. Eclampsia is a seizure that happens from worsening preeclampsia. Diagnosing and managing preeclampsia early is important. If not treated early, it can cause serious problems for mother and baby.  There is no cure for this condition. However, during pregnancy, delivering the baby may be the best treatment for preeclampsia or eclampsia. For most women, symptoms of preeclampsia and eclampsia go away after giving birth. In rare cases, a woman may develop preeclampsia or eclampsia after giving birth. This usually occurs within 48 hours after childbirth but may occur up to 6 weeks after giving birth.  What are the causes?  The cause of this condition is not known.  What increases the risk?  The following factors make you more likely to develop preeclampsia:  Being pregnant for the first time or being pregnant with multiples.  Having had preeclampsia or a condition called hemolysis, elevated liver enzymes, and low platelet count (HELLP)syndrome during a past pregnancy.  Having a family history of preeclampsia.  Being older than age 35.  Being obese.  Becoming pregnant through fertility treatments.  Conditions that reduce blood flow or oxygen to your placenta and baby may also increase your risk. These include:  High blood pressure before, during, or immediately following pregnancy.  Kidney disease.  Diabetes.  Blood clotting disorders.  Autoimmune diseases, such as lupus.  Sleep apnea.  What are the signs or symptoms?  Common symptoms of this condition include:  A severe, throbbing headache that does not go away.  Vision problems, such as blurred or double vision and light sensitivity.  Pain in the stomach,  especially the right upper region.  Pain in the shoulder.  Other symptoms that may develop as the condition gets worse include:  Sudden weight gain because of fluid buildup in the body. This causes swelling of the face, hands, legs, and feet.  Severe nausea and vomiting.  Urinating less than usual.  Shortness of breath.  Seizures.  How is this diagnosed?    Your health care provider will ask you about symptoms and check for signs of preeclampsia during your prenatal visits. You will also have routine tests, including:  Checking your blood pressure.  Urine tests to check for protein.  Blood tests to assess your organ function.  Monitoring your baby's heart rate.  Ultrasounds to check fetal growth.  How is this treated?  You and your health care provider will determine the treatment that is best for you. Treatment may include:  Frequent prenatal visits to check for preeclampsia.  Medicine to lower your blood pressure.  Medicine to prevent seizures.  Low-dose aspirin during your pregnancy.  Staying in the hospital, in severe cases. You will be given medicines to control your blood pressure and the amount of fluids in your body.  Delivering your baby.  Work with your health care provider to manage any chronic health conditions, such as diabetes or kidney problems. Also, work with your health care provider to manage weight gain during pregnancy.  Follow these instructions at home:  Eating and drinking  Drink enough fluid to keep your urine pale yellow.  Avoid caffeine. Caffeine may increase blood pressure and heart rate and lead to dehydration.  Reduce the amount of salt that you eat.  Lifestyle    Do not use any products that contain nicotine or tobacco. These products include cigarettes, chewing tobacco, and vaping devices, such as e-cigarettes. If you need help quitting, ask your health care provider.  Do not use alcohol or drugs.  Avoid stress as much as possible.  Rest and get plenty of sleep.  General  instructions    Take over-the-counter and prescription medicines only as told by your health care provider.  When lying down, lie on your left side. This keeps pressure off your major blood vessels.  When sitting or lying down, raise (elevate) your feet. Try putting pillows underneath your lower legs.  Exercise regularly. Ask your health care provider what kinds of exercise are best for you.  Check your blood pressure as often as recommended by your health care provider.  Keep all prenatal and follow-up visits. This is important.  Contact a health care provider if:  You have symptoms that may need treatment or closer monitoring. These include:  Headaches.  Stomach pain or nausea and vomiting.  Shoulder pain.  Vision problems, such as spots in front of your eyes or blurry vision.  Sudden weight gain or increased swelling in your face, hands, legs, and feet.  Increased anxiety or feeling of impending doom.  Signs or symptoms of labor.  Get help right away if:  You have any of the following symptoms:  A seizure.  Shortness of breath or trouble breathing.  Trouble speaking or slurred speech.  Fainting.  Chest pain.  These symptoms may represent a serious problem that is an emergency. Do not wait to see if the symptoms will go away. Get medical help right away. Call your local emergency services (911 in the [place] Do not drive yourself to the hospital.  Summary  Preeclampsia is a serious condition that may develop during pregnancy.  Diagnosing and treating preeclampsia early is very important.  Keep all prenatal and follow-up visits. This is important.  Get help right away if you have a seizure, shortness of breath or trouble breathing, trouble speaking or slurred speech, chest pain, or fainting.  This information is not intended to replace advice given to you by your health care provider. Make sure you discuss any questions you have with your health care provider.  Document Revised: 01/09/2020 Document Reviewed:  01/09/2020  Elsevier Patient Education  2022 [employer]

## 2021-05-20 LAB — COMPREHENSIVE METABOLIC PANEL
ALT: 12 IU/L (ref 0–32)
AST: 15 IU/L (ref 0–40)
Albumin/Globulin Ratio: 1.5 (ref 1.2–2.2)
Albumin: 3.7 g/dL — ABNORMAL LOW (ref 3.8–4.8)
Alkaline Phosphatase: 192 IU/L — ABNORMAL HIGH (ref 44–121)
BUN/Creatinine Ratio: 15 (ref 9–23)
BUN: 11 mg/dL (ref 6–20)
Bilirubin Total: <0.2 mg/dL (ref 0.0–1.2)
CO2: 19 mmol/L — ABNORMAL LOW (ref 20–29)
Calcium: 9.2 mg/dL (ref 8.7–10.2)
Chloride: 106 mmol/L (ref 96–106)
Creatinine, Ser: 0.73 mg/dL (ref 0.57–1.00)
Globulin, Total: 2.4 g/dL (ref 1.5–4.5)
Glucose: 92 mg/dL (ref 70–99)
Potassium: 3.7 mmol/L (ref 3.5–5.2)
Sodium: 138 mmol/L (ref 134–144)
Total Protein: 6.1 g/dL (ref 6.0–8.5)
eGFR: 113 mL/min/{1.73_m2} (ref >59)

## 2021-05-20 LAB — PROTEIN / CREATININE RATIO, URINE
Creatinine, Urine: 119.3 mg/dL
Protein, Ur: 9.5 mg/dL
Protein/Creat Ratio: 80 mg/g creat (ref 0–200)

## 2021-05-20 LAB — CBC
Hematocrit: 35.8 % (ref 34.0–46.6)
Hemoglobin: 12.3 g/dL (ref 11.1–15.9)
MCH: 30.6 pg (ref 26.6–33.0)
MCHC: 34.4 g/dL (ref 31.5–35.7)
MCV: 89 fL (ref 79–97)
Platelets: 151 10*3/uL (ref 150–450)
RBC: 4.02 x10E6/uL (ref 3.77–5.28)
RDW: 12.9 % (ref 11.7–15.4)
WBC: 7.9 10*3/uL (ref 3.4–10.8)

## 2021-05-21 LAB — CERVICOVAGINAL ANCILLARY ONLY
Chlamydia: NEGATIVE
Comment: NEGATIVE
Comment: NORMAL
Neisseria Gonorrhea: NEGATIVE

## 2021-05-22 LAB — CULTURE, BETA STREP (GROUP B ONLY): Strep Gp B Culture: NEGATIVE

## 2021-05-24 NOTE — Patient Instructions (Signed)
Jeanet Toal  05/24/2021   Your procedure is scheduled on:  06/05/2021  Arrive at 41 at Entrance C on [address] at [facility]  and [employer]. You are invited to use the FREE valet parking or use the Visitor's parking deck.  Pick up the phone at the desk and dial [phone].  Call this number if you have problems the morning of surgery: [phone]  Remember:   Do not eat food:(After Midnight) Desps de medianoche.  Do not drink clear liquids: (After Midnight) Desps de medianoche.  Take these medicines the morning of surgery with A SIP OF WATER:  none   Do not wear jewelry, make-up or nail polish.  Do not wear lotions, powders, or perfumes. Do not wear deodorant.  Do not shave 48 hours prior to surgery.  Do not bring valuables to the hospital.  [facility] is not   responsible for any belongings or valuables brought to the hospital.  Contacts, dentures or bridgework may not be worn into surgery.  Leave suitcase in the car. After surgery it may be brought to your room.  For patients admitted to the hospital, checkout time is 11:00 AM the day of              discharge.      Please read over the following fact sheets that you were given:     Preparing for Surgery

## 2021-05-25 ENCOUNTER — Other Ambulatory Visit: Payer: Self-pay

## 2021-05-25 ENCOUNTER — Telehealth (HOSPITAL_COMMUNITY): Payer: Self-pay | Admitting: *Deleted

## 2021-05-25 ENCOUNTER — Encounter (HOSPITAL_COMMUNITY): Payer: Self-pay

## 2021-05-25 ENCOUNTER — Ambulatory Visit (INDEPENDENT_AMBULATORY_CARE_PROVIDER_SITE_OTHER): Payer: No Typology Code available for payment source | Admitting: Obstetrics & Gynecology

## 2021-05-25 VITALS — BP 137/89 | HR 98 | Wt 172.0 lb

## 2021-05-25 DIAGNOSIS — O34219 Maternal care for unspecified type scar from previous cesarean delivery: Secondary | ICD-10-CM

## 2021-05-25 DIAGNOSIS — O24419 Gestational diabetes mellitus in pregnancy, unspecified control: Secondary | ICD-10-CM

## 2021-05-25 DIAGNOSIS — O09299 Supervision of pregnancy with other poor reproductive or obstetric history, unspecified trimester: Secondary | ICD-10-CM

## 2021-05-25 DIAGNOSIS — Z3A37 37 weeks gestation of pregnancy: Secondary | ICD-10-CM

## 2021-05-25 DIAGNOSIS — O0993 Supervision of high risk pregnancy, unspecified, third trimester: Secondary | ICD-10-CM

## 2021-05-25 DIAGNOSIS — O24410 Gestational diabetes mellitus in pregnancy, diet controlled: Secondary | ICD-10-CM

## 2021-05-25 DIAGNOSIS — Z8759 Personal history of other complications of pregnancy, childbirth and the puerperium: Secondary | ICD-10-CM

## 2021-05-25 DIAGNOSIS — O133 Gestational [pregnancy-induced] hypertension without significant proteinuria, third trimester: Secondary | ICD-10-CM

## 2021-05-25 NOTE — Progress Notes (Signed)
° °  PRENATAL VISIT NOTE  Subjective:  Michelle Schultz is a 31 y.o. G2P1001 at [redacted]w[redacted]d being seen today for ongoing prenatal care.  She is currently monitored for the following issues for this high-risk pregnancy and has Previous cesarean delivery for failure to progress, antepartum; Rubella non-immune status, antepartum; Supervision of high-risk pregnancy; History of HELLP, currently pregnant; History of postpartum hemorrhage; Gestational diabetes; and Transient hypertension of pregnancy in third trimester on their problem list.  Patient reports no complaints. Patient denies any headaches, visual symptoms, RUQ/epigastric pain or other concerning symptoms.  Contractions: Not present. Vag. Bleeding: None.  Movement: Present. Denies leaking of fluid.   The following portions of the patient's history were reviewed and updated as appropriate: allergies, current medications, past family history, past medical history, past social history, past surgical history and problem list.   Objective:   Vitals:   05/25/21 1314  BP: 137/89  Pulse: 98  Weight: 172 lb (78 kg)    Fetal Status: Fetal Heart Rate (bpm): 137   Movement: Present     General:  Alert, oriented and cooperative. Patient is in no acute distress.  Skin: Skin is warm and dry. No rash noted.   Cardiovascular: Normal heart rate noted  Respiratory: Normal respiratory effort, no problems with respiration noted  Abdomen: Soft, gravid, appropriate for gestational age.  Pain/Pressure: Present     Pelvic: Cervical exam deferred        Extremities: Normal range of motion.  Edema: None  Mental Status: Normal mood and affect. Normal behavior. Normal judgment and thought content.   Assessment and Plan:  Pregnancy: G2P1001 at [redacted]w[redacted]d 1. Diet controlled gestational diabetes mellitus (GDM) in third trimester Stable BS, continue diet control  2. Previous cesarean delivery for failure to progress, antepartum Scheduled for RCS at 39 weeks, aware it may  need to be moved up for elevated BP or other concerns.  3. History of HELLP, currently pregnant 4. Transient hypertension of pregnancy in third trimester Borderline BP today, normal BP at home 120s/70-80s.  No symptoms.  Preeclampsia precautions reviewed. If BP elevated again (was elevated on 05/19/21), delivery will be indicated. Will have BP checked during MFM scan tomorrow.  5. [redacted] weeks gestation of pregnancy 6. Supervision of high risk pregnancy in third trimester Negative pelvic cultures.  Labor symptoms and general obstetric precautions including but not limited to vaginal bleeding, contractions, leaking of fluid and fetal movement were reviewed in detail with the patient. Please refer to After Visit Summary for other counseling recommendations.   Return in about 1 week (around 06/01/2021) for OB visits as scheduled.  Future Appointments  Date Time Provider [place]  05/27/2021  4:00 PM [facility]  06/03/2021  9:00 AM [facility] PAT 1 [facility] None  06/03/2021 11:35 AM Aletha Halim, MD [facility]  06/09/2021  1:50 PM Jasminne Mealy, Sallyanne Havers, MD [facility]  06/15/2021  1:30 PM Caren Macadam, MD [facility]    Verita Schneiders, MD

## 2021-05-25 NOTE — Telephone Encounter (Signed)
Preadmission screen  

## 2021-05-26 ENCOUNTER — Encounter: Payer: No Typology Code available for payment source | Admitting: Family Medicine

## 2021-05-27 ENCOUNTER — Ambulatory Visit (HOSPITAL_BASED_OUTPATIENT_CLINIC_OR_DEPARTMENT_OTHER): Payer: No Typology Code available for payment source | Admitting: Obstetrics and Gynecology

## 2021-05-27 ENCOUNTER — Other Ambulatory Visit: Payer: Self-pay | Admitting: Obstetrics and Gynecology

## 2021-05-27 ENCOUNTER — Ambulatory Visit: Payer: No Typology Code available for payment source | Attending: Obstetrics and Gynecology

## 2021-05-27 ENCOUNTER — Other Ambulatory Visit: Payer: Self-pay

## 2021-05-27 VITALS — BP 134/92 | HR 110 | Temp 97.7°F | Ht 61.0 in | Wt 173.0 lb

## 2021-05-27 DIAGNOSIS — O133 Gestational [pregnancy-induced] hypertension without significant proteinuria, third trimester: Secondary | ICD-10-CM | POA: Diagnosis not present

## 2021-05-27 DIAGNOSIS — Z8759 Personal history of other complications of pregnancy, childbirth and the puerperium: Secondary | ICD-10-CM

## 2021-05-27 DIAGNOSIS — O0993 Supervision of high risk pregnancy, unspecified, third trimester: Secondary | ICD-10-CM

## 2021-05-27 DIAGNOSIS — N858 Other specified noninflammatory disorders of uterus: Secondary | ICD-10-CM | POA: Diagnosis not present

## 2021-05-27 DIAGNOSIS — O24419 Gestational diabetes mellitus in pregnancy, unspecified control: Secondary | ICD-10-CM | POA: Diagnosis not present

## 2021-05-27 DIAGNOSIS — O34219 Maternal care for unspecified type scar from previous cesarean delivery: Secondary | ICD-10-CM

## 2021-05-27 DIAGNOSIS — Z363 Encounter for antenatal screening for malformations: Secondary | ICD-10-CM | POA: Diagnosis not present

## 2021-05-27 DIAGNOSIS — O24410 Gestational diabetes mellitus in pregnancy, diet controlled: Secondary | ICD-10-CM | POA: Insufficient documentation

## 2021-05-27 DIAGNOSIS — Z3A37 37 weeks gestation of pregnancy: Secondary | ICD-10-CM | POA: Insufficient documentation

## 2021-05-27 DIAGNOSIS — O09293 Supervision of pregnancy with other poor reproductive or obstetric history, third trimester: Secondary | ICD-10-CM | POA: Insufficient documentation

## 2021-05-27 DIAGNOSIS — O09299 Supervision of pregnancy with other poor reproductive or obstetric history, unspecified trimester: Secondary | ICD-10-CM

## 2021-05-27 MED ORDER — SOD CITRATE-CITRIC ACID 500-334 MG/5ML PO SOLN: 30.0000 mL | ORAL | Status: DC

## 2021-05-27 MED ORDER — CEFAZOLIN SODIUM-DEXTROSE 2-4 GM/100ML-% IV SOLN: 2.0000 g | INTRAVENOUS | Status: DC

## 2021-05-27 NOTE — Progress Notes (Signed)
Maternal-Fetal Medicine   Name: Michelle Schultz DOB: [birth year] MRN: [id number] Referring Provider: Tinnie Gens, MD   I had the pleasure of seeing Michelle Schultz today at [facility].  She is G2 P1 at 37w 5d gestation and is here for fetal growth assessment.  Patient has gestational diabetes that is well controlled on diet.  At her prenatal visit on 05/19/2021, she had blood pressure readings of 142/92 and 131/91 mmHg.  Subsequently, she had blood drawn for labs.  Liver enzymes and platelets were normal.  Urine protein/creatinine ratio was normal.  Today, her blood pressures were 151/92, 141/94 and 134/92 mmHg.  She does not have symptoms of headache, visual disturbances or right upper quadrant pain or vaginal bleeding.    Ultrasound Fetal growth is appropriate for gestational age.  Amniotic fluid is normal good fetal activity seen.  Cephalic presentation.  Our concerns include: Gestational hypertension Patient has several blood pressure readings consistent with gestational hypertension.  She does not have symptoms and signs of severe features of preeclampsia.  I explained the diagnosis and maternal and fetal complications.  I recommended delivery now, and the couple agreed with my recommendations.  Patient would like to have a repeat cesarean delivery.  I discussed with Dr. Vergie Living (Ob Attending) who will be scheduling her for cesarean delivery tomorrow.  I informed the patient that she will be receiving a call from our scheduling office and advised her to keep NPO after 11 PM tonight if cesarean delivery is confirmed for tomorrow.  Thank you for consultation.  If you have any questions or concerns, please contact me the [facility].  Consultation including face-to-face counseling (more than 50% of time spent) is 30 minutes.

## 2021-05-27 NOTE — Progress Notes (Signed)
OB Note Note MFM Dr. Judeth Cornfield called me and recommended delivery, preferably tomorrow, due to new dx of gHTN today. Patient desires repeat c-section. OR called and pt posted for 1430 c-section on Friday, January 27. Pt to arrive at 1115am. I told Mrs. Nolde all this and to be NPO after midnight. Pre-eclampsia, labor, decreased FM precautions given. All questions asked and answered  Cornelia Copa MD Attending Center for [facility] (Faculty Practice) 05/27/2021 Time: [phone]

## 2021-05-28 ENCOUNTER — Encounter (HOSPITAL_COMMUNITY): Payer: Self-pay | Admitting: Obstetrics and Gynecology

## 2021-05-28 ENCOUNTER — Other Ambulatory Visit: Payer: Self-pay

## 2021-05-28 ENCOUNTER — Encounter (HOSPITAL_COMMUNITY)
Admission: RE | Disposition: A | Discharge status: Home / Self Care | Payer: Self-pay | Source: Home / Self Care | Attending: Obstetrics and Gynecology

## 2021-05-28 ENCOUNTER — Inpatient Hospital Stay (HOSPITAL_COMMUNITY)
Admission: RE | Admit: 2021-05-28 | Discharge: 2021-05-30 | DRG: 787 | Disposition: A | Discharge status: Home / Self Care | Payer: No Typology Code available for payment source | Attending: Obstetrics and Gynecology | Admitting: Obstetrics and Gynecology

## 2021-05-28 ENCOUNTER — Inpatient Hospital Stay (HOSPITAL_COMMUNITY): Payer: No Typology Code available for payment source | Admitting: Certified Registered Nurse Anesthetist

## 2021-05-28 DIAGNOSIS — O34211 Maternal care for low transverse scar from previous cesarean delivery: Secondary | ICD-10-CM | POA: Diagnosis present

## 2021-05-28 DIAGNOSIS — O134 Gestational [pregnancy-induced] hypertension without significant proteinuria, complicating childbirth: Secondary | ICD-10-CM | POA: Diagnosis present

## 2021-05-28 DIAGNOSIS — O24420 Gestational diabetes mellitus in childbirth, diet controlled: Secondary | ICD-10-CM | POA: Diagnosis present

## 2021-05-28 DIAGNOSIS — O09299 Supervision of pregnancy with other poor reproductive or obstetric history, unspecified trimester: Secondary | ICD-10-CM

## 2021-05-28 DIAGNOSIS — O9081 Anemia of the puerperium: Secondary | ICD-10-CM | POA: Diagnosis not present

## 2021-05-28 DIAGNOSIS — O24410 Gestational diabetes mellitus in pregnancy, diet controlled: Secondary | ICD-10-CM

## 2021-05-28 DIAGNOSIS — Z8632 Personal history of gestational diabetes: Secondary | ICD-10-CM | POA: Diagnosis present

## 2021-05-28 DIAGNOSIS — Z98891 History of uterine scar from previous surgery: Secondary | ICD-10-CM

## 2021-05-28 DIAGNOSIS — Z20822 Contact with and (suspected) exposure to covid-19: Secondary | ICD-10-CM | POA: Diagnosis present

## 2021-05-28 DIAGNOSIS — Z3A37 37 weeks gestation of pregnancy: Secondary | ICD-10-CM | POA: Diagnosis not present

## 2021-05-28 DIAGNOSIS — Z87891 Personal history of nicotine dependence: Secondary | ICD-10-CM | POA: Diagnosis not present

## 2021-05-28 DIAGNOSIS — O09899 Supervision of other high risk pregnancies, unspecified trimester: Secondary | ICD-10-CM

## 2021-05-28 DIAGNOSIS — O34219 Maternal care for unspecified type scar from previous cesarean delivery: Secondary | ICD-10-CM | POA: Diagnosis present

## 2021-05-28 DIAGNOSIS — D62 Acute posthemorrhagic anemia: Secondary | ICD-10-CM | POA: Diagnosis not present

## 2021-05-28 DIAGNOSIS — O24419 Gestational diabetes mellitus in pregnancy, unspecified control: Secondary | ICD-10-CM | POA: Diagnosis present

## 2021-05-28 DIAGNOSIS — Z2839 Other underimmunization status: Secondary | ICD-10-CM

## 2021-05-28 DIAGNOSIS — O0990 Supervision of high risk pregnancy, unspecified, unspecified trimester: Secondary | ICD-10-CM

## 2021-05-28 DIAGNOSIS — Z8759 Personal history of other complications of pregnancy, childbirth and the puerperium: Secondary | ICD-10-CM

## 2021-05-28 DIAGNOSIS — O133 Gestational [pregnancy-induced] hypertension without significant proteinuria, third trimester: Secondary | ICD-10-CM

## 2021-05-28 LAB — COMPREHENSIVE METABOLIC PANEL
ALT: 17 U/L (ref 0–44)
AST: 19 U/L (ref 15–41)
Albumin: 3 g/dL — ABNORMAL LOW (ref 3.5–5.0)
Alkaline Phosphatase: 173 U/L — ABNORMAL HIGH (ref 38–126)
Anion gap: 8 (ref 5–15)
BUN: 7 mg/dL (ref 6–20)
CO2: 17 mmol/L — ABNORMAL LOW (ref 22–32)
Calcium: 9 mg/dL (ref 8.9–10.3)
Chloride: 112 mmol/L — ABNORMAL HIGH (ref 98–111)
Creatinine, Ser: 0.65 mg/dL (ref 0.44–1.00)
GFR, Estimated: >60 mL/min (ref >60)
Glucose, Bld: 89 mg/dL (ref 70–99)
Potassium: 3.9 mmol/L (ref 3.5–5.1)
Sodium: 137 mmol/L (ref 135–145)
Total Bilirubin: 0.2 mg/dL — ABNORMAL LOW (ref 0.3–1.2)
Total Protein: 6.4 g/dL — ABNORMAL LOW (ref 6.5–8.1)

## 2021-05-28 LAB — CBC
HCT: 37.9 % (ref 36.0–46.0)
Hemoglobin: 12.6 g/dL (ref 12.0–15.0)
MCH: 29.9 pg (ref 26.0–34.0)
MCHC: 33.2 g/dL (ref 30.0–36.0)
MCV: 89.8 fL (ref 80.0–100.0)
Platelets: 152 10*3/uL (ref 150–400)
RBC: 4.22 MIL/uL (ref 3.87–5.11)
RDW: 13.2 % (ref 11.5–15.5)
WBC: 7.9 10*3/uL (ref 4.0–10.5)
nRBC: 0 % (ref 0.0–0.2)

## 2021-05-28 LAB — TYPE AND SCREEN
ABO/RH(D): A POS
Antibody Screen: NEGATIVE

## 2021-05-28 LAB — GLUCOSE, CAPILLARY
Glucose-Capillary: 85 mg/dL (ref 70–99)
Glucose-Capillary: 89 mg/dL (ref 70–99)

## 2021-05-28 LAB — RESP PANEL BY RT-PCR (FLU A&B, COVID) ARPGX2
Influenza A by PCR: NEGATIVE
Influenza B by PCR: NEGATIVE
SARS Coronavirus 2 by RT PCR: NEGATIVE

## 2021-05-28 LAB — RPR: RPR Ser Ql: NONREACTIVE

## 2021-05-28 SURGERY — Surgical Case
Anesthesia: Spinal

## 2021-05-28 MED ORDER — OXYTOCIN-SODIUM CHLORIDE 30-0.9 UT/500ML-% IV SOLN
INTRAVENOUS | Status: DC | PRN
  Administered 2021-05-28: 30 [IU] via INTRAVENOUS

## 2021-05-28 MED ORDER — ACETAMINOPHEN 500 MG PO TABS: 1000.0000 mg | ORAL_TABLET | Freq: Four times a day (QID) | ORAL | Status: DC

## 2021-05-28 MED ORDER — SODIUM CHLORIDE 0.9% FLUSH: 3.0000 mL | INTRAVENOUS | Status: DC | PRN

## 2021-05-28 MED ORDER — OXYTOCIN-SODIUM CHLORIDE 30-0.9 UT/500ML-% IV SOLN: 2.5000 [IU]/h | INTRAVENOUS | Status: AC

## 2021-05-28 MED ORDER — FENTANYL CITRATE (PF) 100 MCG/2ML IJ SOLN
INTRAMUSCULAR | Status: AC
  Filled 2021-05-28: qty 2

## 2021-05-28 MED ORDER — KETOROLAC TROMETHAMINE 30 MG/ML IJ SOLN: 30.0000 mg | Freq: Four times a day (QID) | INTRAMUSCULAR | Status: DC | PRN

## 2021-05-28 MED ORDER — ONDANSETRON HCL 4 MG/2ML IJ SOLN: 4.0000 mg | Freq: Three times a day (TID) | INTRAMUSCULAR | Status: DC | PRN

## 2021-05-28 MED ORDER — MEASLES, MUMPS & RUBELLA VAC IJ SOLR: 0.5000 mL | Freq: Once | INTRAMUSCULAR | Status: DC

## 2021-05-28 MED ORDER — PRENATAL MULTIVITAMIN CH
1.0000 | ORAL_TABLET | Freq: Every day | ORAL | Status: DC
  Administered 2021-05-29 – 2021-05-30 (×2): 1 via ORAL
  Filled 2021-05-28 (×2): qty 1

## 2021-05-28 MED ORDER — NIFEDIPINE ER OSMOTIC RELEASE 30 MG PO TB24
30.0000 mg | ORAL_TABLET | Freq: Every day | ORAL | Status: DC
  Administered 2021-05-28 – 2021-05-30 (×3): 30 mg via ORAL
  Filled 2021-05-28 (×3): qty 1

## 2021-05-28 MED ORDER — DIPHENHYDRAMINE HCL 50 MG/ML IJ SOLN: 12.5000 mg | INTRAMUSCULAR | Status: DC | PRN

## 2021-05-28 MED ORDER — DEXAMETHASONE SODIUM PHOSPHATE 10 MG/ML IJ SOLN
INTRAMUSCULAR | Status: AC
  Filled 2021-05-28: qty 1

## 2021-05-28 MED ORDER — TRANEXAMIC ACID-NACL 1000-0.7 MG/100ML-% IV SOLN
1000.0000 mg | INTRAVENOUS | Status: AC
  Administered 2021-05-28: 1000 mg via INTRAVENOUS

## 2021-05-28 MED ORDER — KETOROLAC TROMETHAMINE 30 MG/ML IJ SOLN
30.0000 mg | Freq: Four times a day (QID) | INTRAMUSCULAR | Status: DC | PRN
  Administered 2021-05-28: 30 mg via INTRAVENOUS

## 2021-05-28 MED ORDER — SIMETHICONE 80 MG PO CHEW
80.0000 mg | CHEWABLE_TABLET | Freq: Three times a day (TID) | ORAL | Status: DC
  Administered 2021-05-29 (×2): 80 mg via ORAL
  Filled 2021-05-28 (×2): qty 1

## 2021-05-28 MED ORDER — ACETAMINOPHEN 500 MG PO TABS: 1000.0000 mg | ORAL_TABLET | Freq: Once | ORAL | Status: DC

## 2021-05-28 MED ORDER — KETOROLAC TROMETHAMINE 30 MG/ML IJ SOLN
INTRAMUSCULAR | Status: AC
  Filled 2021-05-28: qty 1

## 2021-05-28 MED ORDER — DEXAMETHASONE SODIUM PHOSPHATE 10 MG/ML IJ SOLN
INTRAMUSCULAR | Status: DC | PRN
  Administered 2021-05-28: 10 mg via INTRAVENOUS

## 2021-05-28 MED ORDER — SENNOSIDES-DOCUSATE SODIUM 8.6-50 MG PO TABS
2.0000 | ORAL_TABLET | ORAL | Status: DC
  Administered 2021-05-29: 2 via ORAL
  Filled 2021-05-28: qty 2

## 2021-05-28 MED ORDER — ZOLPIDEM TARTRATE 5 MG PO TABS: 5.0000 mg | ORAL_TABLET | Freq: Every evening | ORAL | Status: DC | PRN

## 2021-05-28 MED ORDER — DIBUCAINE (PERIANAL) 1 % EX OINT: 1.0000 "application " | TOPICAL_OINTMENT | CUTANEOUS | Status: DC | PRN

## 2021-05-28 MED ORDER — MENTHOL 3 MG MT LOZG: 1.0000 | LOZENGE | OROMUCOSAL | Status: DC | PRN

## 2021-05-28 MED ORDER — OXYTOCIN-SODIUM CHLORIDE 30-0.9 UT/500ML-% IV SOLN
INTRAVENOUS | Status: AC
  Filled 2021-05-28: qty 500

## 2021-05-28 MED ORDER — ENOXAPARIN SODIUM 40 MG/0.4ML IJ SOSY
40.0000 mg | PREFILLED_SYRINGE | INTRAMUSCULAR | Status: DC
  Administered 2021-05-29 – 2021-05-30 (×2): 40 mg via SUBCUTANEOUS
  Filled 2021-05-28 (×2): qty 0.4

## 2021-05-28 MED ORDER — IBUPROFEN 600 MG PO TABS
600.0000 mg | ORAL_TABLET | Freq: Four times a day (QID) | ORAL | Status: DC
  Administered 2021-05-28 – 2021-05-30 (×7): 600 mg via ORAL
  Filled 2021-05-28 (×7): qty 1

## 2021-05-28 MED ORDER — MORPHINE SULFATE (PF) 0.5 MG/ML IJ SOLN
INTRAMUSCULAR | Status: AC
  Filled 2021-05-28: qty 10

## 2021-05-28 MED ORDER — OXYCODONE HCL 5 MG PO TABS: 5.0000 mg | ORAL_TABLET | ORAL | Status: DC | PRN

## 2021-05-28 MED ORDER — PHENYLEPHRINE HCL-NACL 20-0.9 MG/250ML-% IV SOLN
INTRAVENOUS | Status: AC
  Filled 2021-05-28: qty 250

## 2021-05-28 MED ORDER — FENTANYL CITRATE (PF) 100 MCG/2ML IJ SOLN: 25.0000 ug | INTRAMUSCULAR | Status: DC | PRN

## 2021-05-28 MED ORDER — WITCH HAZEL-GLYCERIN EX PADS: 1.0000 "application " | MEDICATED_PAD | CUTANEOUS | Status: DC | PRN

## 2021-05-28 MED ORDER — SIMETHICONE 80 MG PO CHEW: 80.0000 mg | CHEWABLE_TABLET | ORAL | Status: DC | PRN

## 2021-05-28 MED ORDER — KETOROLAC TROMETHAMINE 30 MG/ML IJ SOLN: 30.0000 mg | Freq: Once | INTRAMUSCULAR | Status: DC

## 2021-05-28 MED ORDER — ONDANSETRON HCL 4 MG/2ML IJ SOLN
INTRAMUSCULAR | Status: AC
  Filled 2021-05-28: qty 2

## 2021-05-28 MED ORDER — STERILE WATER FOR IRRIGATION IR SOLN
Status: DC | PRN
  Administered 2021-05-28: 1000 mL

## 2021-05-28 MED ORDER — DIPHENHYDRAMINE HCL 25 MG PO CAPS: 25.0000 mg | ORAL_CAPSULE | ORAL | Status: DC | PRN

## 2021-05-28 MED ORDER — POVIDONE-IODINE 10 % EX SWAB
2.0000 "application " | Freq: Once | CUTANEOUS | Status: AC
  Administered 2021-05-28: 2 via TOPICAL

## 2021-05-28 MED ORDER — TRANEXAMIC ACID-NACL 1000-0.7 MG/100ML-% IV SOLN
INTRAVENOUS | Status: AC
  Filled 2021-05-28: qty 100

## 2021-05-28 MED ORDER — PHENYLEPHRINE HCL-NACL 20-0.9 MG/250ML-% IV SOLN
INTRAVENOUS | Status: DC | PRN
Start: 2021-05-28 — End: 2021-05-28
  Administered 2021-05-28: 60 ug/min via INTRAVENOUS

## 2021-05-28 MED ORDER — ACETAMINOPHEN 160 MG/5ML PO SOLN: 1000.0000 mg | Freq: Once | ORAL | Status: DC

## 2021-05-28 MED ORDER — ACETAMINOPHEN 500 MG PO TABS
1000.0000 mg | ORAL_TABLET | Freq: Four times a day (QID) | ORAL | Status: DC
  Administered 2021-05-28 – 2021-05-30 (×7): 1000 mg via ORAL
  Filled 2021-05-28 (×8): qty 2

## 2021-05-28 MED ORDER — SOD CITRATE-CITRIC ACID 500-334 MG/5ML PO SOLN
ORAL | Status: AC
  Filled 2021-05-28: qty 30

## 2021-05-28 MED ORDER — SODIUM CHLORIDE 0.9 % IR SOLN
Status: DC | PRN
  Administered 2021-05-28: 1

## 2021-05-28 MED ORDER — LACTATED RINGERS IV SOLN: INTRAVENOUS | Status: DC

## 2021-05-28 MED ORDER — MORPHINE SULFATE (PF) 0.5 MG/ML IJ SOLN
INTRAMUSCULAR | Status: DC | PRN
  Administered 2021-05-28: 150 ug via INTRATHECAL

## 2021-05-28 MED ORDER — FENTANYL CITRATE (PF) 100 MCG/2ML IJ SOLN
INTRAMUSCULAR | Status: DC | PRN
  Administered 2021-05-28: 15 ug via INTRATHECAL

## 2021-05-28 MED ORDER — CEFAZOLIN SODIUM-DEXTROSE 2-4 GM/100ML-% IV SOLN
2.0000 g | INTRAVENOUS | Status: AC
  Administered 2021-05-28: 2 g via INTRAVENOUS

## 2021-05-28 MED ORDER — DIPHENHYDRAMINE HCL 25 MG PO CAPS
25.0000 mg | ORAL_CAPSULE | Freq: Four times a day (QID) | ORAL | Status: DC | PRN
Start: 2021-05-28 — End: 2021-05-30

## 2021-05-28 MED ORDER — NALOXONE HCL 0.4 MG/ML IJ SOLN: 0.4000 mg | INTRAMUSCULAR | Status: DC | PRN

## 2021-05-28 MED ORDER — BUPIVACAINE IN DEXTROSE 0.75-8.25 % IT SOLN
INTRATHECAL | Status: DC | PRN
  Administered 2021-05-28: 1.6 mL via INTRATHECAL

## 2021-05-28 MED ORDER — COCONUT OIL OIL: 1.0000 "application " | TOPICAL_OIL | Status: DC | PRN

## 2021-05-28 MED ORDER — NALOXONE HCL 4 MG/10ML IJ SOLN
1.0000 ug/kg/h | INTRAVENOUS | Status: DC | PRN
  Filled 2021-05-28: qty 5

## 2021-05-28 MED ORDER — PROMETHAZINE HCL 25 MG/ML IJ SOLN: 6.2500 mg | INTRAMUSCULAR | Status: DC | PRN

## 2021-05-28 MED ORDER — ONDANSETRON HCL 4 MG/2ML IJ SOLN
INTRAMUSCULAR | Status: DC | PRN
  Administered 2021-05-28: 4 mg via INTRAVENOUS

## 2021-05-28 MED ORDER — CEFAZOLIN SODIUM-DEXTROSE 2-4 GM/100ML-% IV SOLN
INTRAVENOUS | Status: AC
  Filled 2021-05-28: qty 100

## 2021-05-28 SURGICAL SUPPLY — 34 items
BENZOIN TINCTURE PRP APPL 2/3 (GAUZE/BANDAGES/DRESSINGS) ×2 IMPLANT
CHLORAPREP W/TINT 26ML (MISCELLANEOUS) ×2 IMPLANT
CLAMP CORD UMBIL (MISCELLANEOUS) IMPLANT
CLOTH BEACON ORANGE TIMEOUT ST (SAFETY) ×2 IMPLANT
CLSR STERI-STRIP ANTIMIC 1/2X4 (GAUZE/BANDAGES/DRESSINGS) ×1 IMPLANT
DRSG OPSITE POSTOP 4X10 (GAUZE/BANDAGES/DRESSINGS) ×2 IMPLANT
ELECT REM PT RETURN 9FT ADLT (ELECTROSURGICAL) ×2
ELECTRODE REM PT RTRN 9FT ADLT (ELECTROSURGICAL) ×1 IMPLANT
EXTRACTOR VACUUM M CUP 4 TUBE (SUCTIONS) IMPLANT
GLOVE BIOGEL PI IND STRL 7.0 (GLOVE) ×2 IMPLANT
GLOVE BIOGEL PI IND STRL 7.5 (GLOVE) ×2 IMPLANT
GLOVE BIOGEL PI INDICATOR 7.0 (GLOVE) ×2
GLOVE BIOGEL PI INDICATOR 7.5 (GLOVE) ×2
GLOVE ECLIPSE 7.5 STRL STRAW (GLOVE) ×2 IMPLANT
GOWN STRL REUS W/TWL LRG LVL3 (GOWN DISPOSABLE) ×6 IMPLANT
KIT ABG SYR 3ML LUER SLIP (SYRINGE) IMPLANT
NDL HYPO 25X5/8 SAFETYGLIDE (NEEDLE) IMPLANT
NEEDLE HYPO 25X5/8 SAFETYGLIDE (NEEDLE) IMPLANT
NS IRRIG 1000ML POUR BTL (IV SOLUTION) ×2 IMPLANT
PACK C SECTION WH (CUSTOM PROCEDURE TRAY) ×2 IMPLANT
PAD OB MATERNITY 4.3X12.25 (PERSONAL CARE ITEMS) ×2 IMPLANT
PENCIL SMOKE EVAC W/HOLSTER (ELECTROSURGICAL) ×2 IMPLANT
RTRCTR C-SECT PINK 25CM LRG (MISCELLANEOUS) ×2 IMPLANT
STRIP CLOSURE SKIN 1/2X4 (GAUZE/BANDAGES/DRESSINGS) ×2 IMPLANT
SUT PLAIN 2 0 XLH (SUTURE) ×1 IMPLANT
SUT VIC AB 0 CT1 36 (SUTURE) ×2 IMPLANT
SUT VIC AB 0 CTX 36 (SUTURE) ×5
SUT VIC AB 0 CTX36XBRD ANBCTRL (SUTURE) ×2 IMPLANT
SUT VIC AB 2-0 CT1 27 (SUTURE) ×1
SUT VIC AB 2-0 CT1 TAPERPNT 27 (SUTURE) ×1 IMPLANT
SUT VIC AB 4-0 KS 27 (SUTURE) ×2 IMPLANT
TOWEL OR 17X24 6PK STRL BLUE (TOWEL DISPOSABLE) ×2 IMPLANT
TRAY FOLEY W/BAG SLVR 14FR LF (SET/KITS/TRAYS/PACK) ×2 IMPLANT
WATER STERILE IRR 1000ML POUR (IV SOLUTION) ×2 IMPLANT

## 2021-05-28 NOTE — Progress Notes (Signed)
PT's BP diastolics 90 and 94 1 hour apart. Dr. Ephriam Jenkins notified and orders for Procardia 30mg  daily received. Pt without other s/s of PIH at this time.

## 2021-05-28 NOTE — Anesthesia Procedure Notes (Signed)
Spinal  Patient location during procedure: OR Start time: 05/28/2021 1:28 PM End time: 05/28/2021 1:31 PM Reason for block: surgical anesthesia Staffing Performed: anesthesiologist  Anesthesiologist: Kaylyn Layer, MD Preanesthetic Checklist Completed: patient identified, IV checked, risks and benefits discussed, monitors and equipment checked, pre-op evaluation and timeout performed Spinal Block Patient position: sitting Prep: DuraPrep and site prepped and draped Patient monitoring: heart rate, continuous pulse ox and blood pressure Approach: midline Location: L3-4 Injection technique: single-shot Needle Needle type: Pencan  Needle gauge: 24 G Needle length: 10 cm Assessment Sensory level: T4 Events: CSF return Additional Notes Risks, benefits, and alternative discussed. Patient gave consent to procedure. Prepped and draped in sitting position. Clear CSF obtained after one needle pass. Positive terminal aspiration. No pain or paraesthesias with injection. Patient tolerated procedure well. Vital signs stable. Amalia Greenhouse, MD

## 2021-05-28 NOTE — Transfer of Care (Signed)
Immediate Anesthesia Transfer of Care Note  Patient: Michelle Schultz  Procedure(s) Performed: CESAREAN SECTION  Patient Location: PACU  Anesthesia Type:Spinal  Level of Consciousness: awake, alert  and oriented  Airway & Oxygen Therapy: Patient Spontanous Breathing  Post-op Assessment: Report given to RN and Post -op Vital signs reviewed and stable  Post vital signs: Reviewed and stable  Last Vitals:  Vitals Value Taken Time  BP 113/78 05/28/21 1510  Temp    Pulse 68 05/28/21 1514  Resp 16 05/28/21 1514  SpO2 100 % 05/28/21 1514  Vitals shown include unvalidated device data.  Last Pain:  Vitals:   05/28/21 1134  TempSrc: Oral         Complications: No notable events documented.

## 2021-05-28 NOTE — H&P (Signed)
OBSTETRIC ADMISSION HISTORY AND PHYSICAL  Michelle Schultz is a 31 y.o. female G2P1001 with IUP at 41w6dby LMP presenting for scheduled repeat cesarean section for newly-diagnosed gHTN. She reports +FMs, No LOF, no VB, no blurry vision, headaches or peripheral edema, and RUQ pain. Mild BP elevations last several days including with mfm yesterday.  She received her prenatal care at  [facility]   Dating: By LMP --->  Estimated Date of Delivery: 06/12/21  Sono:   @[redacted]w[redacted]d , CWD, normal anatomy, cephalic presentation, right anterior lie, 3060g, 39% EFW   Prenatal History/Complications:  GDM diet controlled gHTN Past Medical History: Past Medical History:  Diagnosis Date   Eczema    Gestational diabetes    History of HELLP syndrome, currently pregnant    History of pre-eclampsia    Pregnancy induced hypertension     Past Surgical History: Past Surgical History:  Procedure Laterality Date   CESAREAN SECTION N/A 01/21/2017   Procedure: CESAREAN SECTION;  Surgeon: AOsborne Oman MD;  [place]  Service: Obstetrics;  Laterality: N/A;    Obstetrical History: OB History     Gravida  2   Para  1   Term  1   Preterm      AB      Living  1      SAB      IAB      Ectopic      Multiple  0   Live Births  1           Social History Social History   Socioeconomic History   Marital status: Married    Spouse name: Michelle Schultz  Number of children: Not on file   Years of education: 16   Highest education level: Not on file  Occupational History   Occupation: Real estate  Tobacco Use   Smoking status: Former    Years: 2.00    Types: Cigarettes   Smokeless tobacco: Never   Tobacco comments:    1 pack/week-quit 2017  Vaping Use   Vaping Use: Never used  Substance and Sexual Activity   Alcohol use: Not Currently    Comment: prior to pregnancy   Drug use: Never   Sexual activity: Yes    Partners: Male    Birth control/protection: None  Other  Topics Concern   Not on file  Social History Narrative   Not on file   Social Determinants of Health   Financial Resource Strain: Not on file  Food Insecurity: Not on file  Transportation Needs: Not on file  Physical Activity: Not on file  Stress: Not on file  Social Connections: Not on file    Family History: Family History  Problem Relation Age of Onset   Hypertension Mother    Heart attack Maternal Grandfather    Heart attack Paternal Grandfather    Breast cancer Neg Hx    Ovarian cancer Neg Hx    Diabetes Neg Hx     Allergies: Not on File  Medications Prior to Admission  Medication Sig Dispense Refill Last Dose   aspirin EC 81 MG tablet Take 1 tablet (81 mg total) by mouth daily. Take after 12 weeks for prevention of preeclampsia later in pregnancy 300 tablet 2    Prenatal Vit-Fe Fumarate-FA (PRENATAL MULTIVITAMIN) TABS tablet Take 1 tablet by mouth daily at 12 noon.      Accu-Chek Softclix Lancets lancets 1 each by Other route 4 (four) times daily. 100 each 12  Blood Glucose Monitoring Suppl (ACCU-CHEK GUIDE) w/Device KIT 1 Device by Does not apply route 4 (four) times daily. 1 kit 0    glucose blood (ACCU-CHEK GUIDE) test strip Use to check blood sugars four times a day was instructed 100 each 12      Review of Systems   All systems reviewed and negative except as stated in HPI  Blood pressure (!) 123/98, pulse 90, temperature 98.5 F (36.9 C), temperature source Oral, resp. rate 18, height 5' 1"  (1.549 m), weight 78 kg, last menstrual period 09/05/2020, SpO2 98 %. General appearance: alert, cooperative, and appears stated age Lungs: clear to auscultation bilaterally Heart: regular rate and rhythm Abdomen: soft, non-tender; bowel sounds normal Pelvic: n/a Extremities: Homans sign is negative, no sign of DVT Presentation: cephalic Fetal monitoring 136 Uterine activityNone     Prenatal labs: ABO, Rh: --/--/PENDING (01/27 1123) Antibody: PENDING (01/27  1123) Rubella: <0.90 (07/21 1110) RPR: Non Reactive (11/16 1006)  HBsAg: Negative (07/21 1110)  HIV: Non Reactive (11/16 1006)  GBS: Negative/-- (01/18 1523)  2 hr Glucola abnormal Genetic screening  LR NIPS, negative [facility] US normal  Prenatal Transfer Tool  Maternal Diabetes: Yes:  Diabetes Type:  Diet controlled Genetic Screening: Normal Maternal Ultrasounds/Referrals: Normal Fetal Ultrasounds or other Referrals:  None Maternal Substance Abuse:  No Significant Maternal Medications:  None Significant Maternal Lab Results: Group B Strep negative  Results for orders placed or performed during the hospital encounter of 05/28/21 (from the past 24 hour(s))  Type and screen   Collection Time: 05/28/21 11:23 AM  Result Value Ref Range   ABO/RH(D) PENDING    Antibody Screen PENDING    Sample Expiration      05/31/2021,2359 Performed at [facility], [address], [place],  [address]   CBC   Collection Time: 05/28/21 11:24 AM  Result Value Ref Range   WBC 7.9 4.0 - 10.5 K/uL   RBC 4.22 3.87 - 5.11 MIL/uL   Hemoglobin 12.6 12.0 - 15.0 g/dL   HCT 37.9 36.0 - 46.0 %   MCV 89.8 80.0 - 100.0 fL   MCH 29.9 26.0 - 34.0 pg   MCHC 33.2 30.0 - 36.0 g/dL   RDW 13.2 11.5 - 15.5 %   Platelets 152 150 - 400 K/uL   nRBC 0.0 0.0 - 0.2 %  Glucose, capillary   Collection Time: 05/28/21 11:36 AM  Result Value Ref Range   Glucose-Capillary 85 70 - 99 mg/dL    Patient Active Problem List   Diagnosis Date Noted   History of gestational hypertension 05/27/2021   Transient hypertension of pregnancy in third trimester 04/27/2021   Gestational diabetes 03/18/2021   Supervision of high-risk pregnancy 11/19/2020   History of HELLP, currently pregnant 11/19/2020   History of postpartum hemorrhage 11/19/2020   Previous cesarean delivery for failure to progress, antepartum 09/14/2016   Rubella non-immune status, antepartum 09/14/2016    Assessment/Plan:  Michelle Schultz  is a 31 y.o. G2P1001 at 24w6dhere for scheduled repeat cesarean section and known gHTN. Discussed risks/benefits of tolac, patient declines tolac.  #Scheduled repeat cesarean section One prior cesarean section. Opts for repeat cesarean section The risks of cesarean section were discussed with the patient including but were not limited to: bleeding which may require transfusion or reoperation; infection which may require antibiotics; injury to bowel, bladder, ureters or other surrounding organs; injury to the fetus; need for additional procedures including hysterectomy in the event of a life-threatening hemorrhage;  placental abnormalities wth subsequent pregnancies, incisional problems, thromboembolic phenomenon and other postoperative/anesthesia complications.  The patient concurred with the proposed plan, giving informed written consent for the procedure.  Patient has been NPO since midnight she will remain NPO for procedure. Anesthesia and OR aware.  Preoperative prophylactic antibiotics and SCDs ordered on call to the OR.  To OR when ready.  #gHTN: asymptomatic, no severe-range pressures. Cbc and cmp are pending. No indication for Mg currently.  #A1GDM: reports normal fastings, with normal fasting glucose today. Normal fluid and efw.   #Hx pph: last pregnancy in setting of hellp, will plan on prophylactic txa at time of skin incision  #Pain: spinal #ID:  GBS neg #MOF: breast #MOC:undecided, declines BTL #Circ:  N/A

## 2021-05-28 NOTE — Lactation Note (Signed)
This note was copied from a baby's chart. Lactation Consultation Note  Patient Name: Michelle Schultz GGEZM'O Date: 05/28/2021 Reason for consult: Initial assessment;Early term 37-38.6wks Age:32 hours  Initial visit to 7 hours old infant of a P2 mother. Mother explains her first baby was in NICU for a week, she had low milk supply and difficulty breastfeeding.  Baby is latched upon arrival and seems to lose depth often. LC demonstrated hand expression, colostrum is easily expressed. Discussed different positions, alignment, and use of support pillows. Talked about normal newborn behavior and patterns, signs of good milk transfer, hunger cues, tummy size and benefits of skin to skin.  Mother does not have a breast pump. Talked about options.   Plan: 1-Deep, comfortable latch and breastfeeding on demand or 8-12 times in 24h period. 2-Encouraged maternal rest, hydration and food intake.  3-Contact LC as needed for feeds/support/concerns/questions   All questions answered at this time. Provided Lactation services brochure and promoted [employer] booklet information.     Maternal Data Has patient been taught Hand Expression?: Yes Does the patient have breastfeeding experience prior to this delivery?: Yes How long did the patient breastfeed?: 1 week  Feeding Mother's Current Feeding Choice: Breast Milk  LATCH Score Latch: Grasps breast easily, tongue down, lips flanged, rhythmical sucking.  Audible Swallowing: Spontaneous and intermittent  Type of Nipple: Everted at rest and after stimulation  Comfort (Breast/Nipple): Soft / non-tender  Hold (Positioning): Assistance needed to correctly position infant at breast and maintain latch.  LATCH Score: 9  Interventions Interventions: Breast feeding basics reviewed;Assisted with latch;Skin to skin;Breast massage;Hand express;Adjust position;Breast compression;Position options;Support pillows;Expressed milk;Education;LC Economist  Discharge WIC Program: No  Consult Status Consult Status: Follow-up Date: 05/29/21 Follow-up type: In-patient    Michelle Schultz 05/28/2021, 9:56 PM

## 2021-05-28 NOTE — Op Note (Signed)
Operative Note   Patient: Michelle Schultz  Date of Procedure: 05/28/2021  Procedure: Repeat Low Transverse Cesarean   Indications:  scheduled repeat cesarean section, gHTN  Pre-operative Diagnosis: Gestational Hypertension at [redacted]w[redacted]d MFM recommendation, gHTN  Post-operative Diagnosis: Same  TOLAC Candidate: No  Surgeon: Surgeon(s) and Role:    * Wouk, Ailene Rud, MD - Primary    * Renard Matter, MD - Assisting   An experienced assistant was required given the standard of surgical care given the complexity of the case.  This assistant was needed for exposure, dissection, suctioning, retraction, instrument exchange, assisting with delivery with administration of fundal pressure, and for overall help during the procedure.   Anesthesia: spinal  Anesthesiologist: No responsible provider has been recorded for the case.   Antibiotics: Cefazolin   Estimated Blood Loss: 700 ml   Total IV Fluids: 2500 ml  Urine Output:  100 cc OF clear urine  Specimens: None   Complications:  None    Indications: Michelle Schultz is a 31 y.o. [id number] with an IUP [redacted]w[redacted]d presenting for scheduled cesarean secondary to the indications listed above.   Findings: Viable infant in cephalic presentation, nuchal x1. Apgars9 , 9 . Weight 2980 g . Clear amniotic fluid. Normal placenta, three vessel cord. Normal uterus  Procedure Details: A Time Out was held and the above information confirmed. The patient received intravenous antibiotics and had sequential compression devices applied to her lower extremities preoperatively. The patient was taken back to the operative suite where spinal anesthesia was administered. After induction of anesthesia, the patient was draped and prepped in the usual sterile manner and placed in a dorsal supine position with a leftward tilt. Due to history of post partum hemorrhage during prior cesarean section patient was given TXA at skin incision. A low transverse skin incision was made with  scalpel at the superior edge of prior cesarean scar and carried down through the subcutaneous tissue to the fascia. Fascial incision was made and extended transversely. The fascia was separated from the underlying rectus tissue superiorly and inferiorly. The rectus muscles were separated in the midline bluntly and the peritoneum was entered bluntly. An Alexis retractor was placed to aid in visualization of the uterus. A bladder flap was not developed. A low transverse uterine incision was made. The infant was successfully delivered from cephalic presentation, the umbilical cord was clamped after 1 minute. Cord ph was not sent, and cord blood was obtained for evaluation. The placenta was removed Intact and appeared normal. The uterine incision was closed with running locked sutures of 0-Vicryl, and then a second imbricating layer was also placed with 0-Vicryl. Due to ongoing bleeding from the right lateral edge of the hysterotomy multiple figure of eight sutures of 0 Vicryl were placed. After initial few figure of eight stitches oozing continued from the suture sites however upon holding pressure, cauterizing, and doing more figure of eight stitches the site was found to be hemostatic.   The abdomen and the pelvis were cleared of all clot and debris and the Ubaldo Glassing was removed. Hemostasis was confirmed on all surfaces.  The peritoneum was was not reapproximated. The fascia was then closed using 0 Vicryl in a running fashion. The subcutaneous layer was reapproximated with plain gut and the skin was closed with a 4-0 vicryl subcuticular stitch. The patient tolerated the procedure well. Sponge, lap, instrument and needle counts were correct x 2. She was taken to the recovery room in stable condition.  Disposition: PACU - hemodynamically stable.  Signed: Renard Matter, MD, MPH [facility])

## 2021-05-28 NOTE — Anesthesia Postprocedure Evaluation (Signed)
Anesthesia Post Note  Patient: Sports coach  Procedure(s) Performed: CESAREAN SECTION     Patient location during evaluation: PACU Anesthesia Type: Spinal Level of consciousness: awake and alert Pain management: pain level controlled Vital Signs Assessment: post-procedure vital signs reviewed and stable Respiratory status: spontaneous breathing, nonlabored ventilation and respiratory function stable Cardiovascular status: blood pressure returned to baseline Postop Assessment: no apparent nausea or vomiting, spinal receding, no headache and no backache Anesthetic complications: no   No notable events documented.  Last Vitals:  Vitals:   05/28/21 1603 05/28/21 1615  BP: 129/87 (!) 118/94  Pulse: 74 77  Resp: 17 18  Temp:  36.7 C  SpO2: 100% 100%    Last Pain:  Vitals:   05/28/21 1615  TempSrc: Oral   Pain Goal:    LLE Motor Response: Purposeful movement (05/28/21 1615)   RLE Motor Response: Purposeful movement (05/28/21 1615)       Epidural/Spinal Function Cutaneous sensation: Tingles (05/28/21 1615), Patient able to flex knees: Yes (05/28/21 1615), Patient able to lift hips off bed: No (05/28/21 1615), Back pain beyond tenderness at insertion site: No (05/28/21 1615), Progressively worsening motor and/or sensory loss: No (05/28/21 1615), Bowel and/or bladder incontinence post epidural: No (05/28/21 1615)  Marthenia Rolling

## 2021-05-28 NOTE — Anesthesia Preprocedure Evaluation (Addendum)
Anesthesia Evaluation  Patient identified by MRN, date of birth, ID band Patient awake    Reviewed: Allergy & Precautions, NPO status , Patient's Chart, lab work & pertinent test results  History of Anesthesia Complications Negative for: history of anesthetic complications  Airway Mallampati: II  TM Distance: >3 FB Neck ROM: Full    Dental no notable dental hx.    Pulmonary former smoker,    Pulmonary exam normal        Cardiovascular hypertension (gestational), Normal cardiovascular exam     Neuro/Psych negative neurological ROS  negative psych ROS   GI/Hepatic negative GI ROS, Neg liver ROS,   Endo/Other  diabetes, Gestational  Renal/GU negative Renal ROS  negative genitourinary   Musculoskeletal negative musculoskeletal ROS (+)   Abdominal   Peds  Hematology negative hematology ROS (+)   Anesthesia Other Findings Day of surgery medications reviewed with patient.  Reproductive/Obstetrics (+) Pregnancy (Hx of C/S x1)                            Anesthesia Physical Anesthesia Plan  ASA: 3  Anesthesia Plan: Spinal   Post-op Pain Management:    Induction:   PONV Risk Score and Plan: 4 or greater and Treatment may vary due to age or medical condition, Ondansetron and Dexamethasone  Airway Management Planned: Natural Airway  Additional Equipment: None  Intra-op Plan:   Post-operative Plan:   Informed Consent: I have reviewed the patients History and Physical, chart, labs and discussed the procedure including the risks, benefits and alternatives for the proposed anesthesia with the patient or authorized representative who has indicated his/her understanding and acceptance.       Plan Discussed with: CRNA  Anesthesia Plan Comments:        Anesthesia Quick Evaluation

## 2021-05-28 NOTE — Discharge Summary (Signed)
Postpartum Discharge Summary       Patient Name: Michelle Schultz DOB: 17-Jun-1990 MRN: [id number]  Date of admission: 05/28/2021 Delivery date:05/28/2021  Delivering provider: Renard Matter  Date of discharge: 05/30/2021  Admitting diagnosis: History of cesarean section [Z98.891] Intrauterine pregnancy: [redacted]w[redacted]d    Secondary diagnosis:  Principal Problem:   History of cesarean section Active Problems:   Previous cesarean delivery for failure to progress, antepartum   Rubella non-immune status, antepartum   Supervision of high-risk pregnancy   History of HELLP, currently pregnant   History of postpartum hemorrhage   Gestational diabetes   Status post repeat low transverse cesarean section  Additional problems: None    Discharge diagnosis: Term Pregnancy Delivered, Gestational Hypertension, and GDM A1                                              Post partum procedures: None Augmentation: N/A Complications: None  Hospital course: Sceduled C/S   31y.o. yo G2P2002 at 32w6das admitted to the hospital 05/28/2021 for scheduled cesarean section with the following indication:Elective Repeat.Delivery details are as follows:  Membrane Rupture Time/Date: 1:53 PM ,05/28/2021   Delivery Method:C-Section, Low Transverse  Details of operation can be found in separate operative note.  Patient had an uncomplicated postpartum course.BP well-controlled w/ Procardia 30 mg XL. Lasix 20 mg BID started at D/C. Attempted to set up Baby Scripts, but was not able to do so. CBC 129 PPD#1. Blood sugar monitoring D/C'd,   She is ambulating, tolerating a regular diet, passing flatus, and urinating well. Patient is discharged home in stable condition on  05/30/21        Newborn Data: Birth date:05/28/2021  Birth time:1:54 PM  Gender:Female  Living status:Living  Apgars:9 ,9  Weight:2980 g     Magnesium Sulfate received: No BMZ received: No Rhophylac:N/A MMR:No T-DaP:Given  prenatally FlQQI:WLNLGXQJransfusion:No  Physical exam  Vitals:   05/29/21 1200 05/29/21 2056 05/30/21 0631 05/30/21 1100  BP: 113/75 110/71 109/77 114/77  Pulse:  82 (!) 58   Resp:  18 18   Temp:  98.2 F (36.8 C) 97.7 F (36.5 C)   TempSrc:  Oral Oral   SpO2:      Weight:      Height:       General: alert, cooperative, and no distress Lochia: appropriate Uterine Fundus: firm Incision: Healing well with no significant drainage, No significant erythema DVT Evaluation: No evidence of DVT seen on physical exam. Calf/Ankle edema is present Labs: Lab Results  Component Value Date   WBC 7.9 05/28/2021   HGB 9.5 (L) 05/29/2021   HCT 37.9 05/28/2021   MCV 89.8 05/28/2021   PLT 152 05/28/2021   CMP Latest Ref Rng & Units 05/29/2021  Glucose 70 - 99 mg/dL 165(H)  BUN 6 - 20 mg/dL -  Creatinine 0.44 - 1.00 mg/dL -  Sodium 135 - 145 mmol/L -  Potassium 3.5 - 5.1 mmol/L -  Chloride 98 - 111 mmol/L -  CO2 22 - 32 mmol/L -  Calcium 8.9 - 10.3 mg/dL -  Total Protein 6.5 - 8.1 g/dL -  Total Bilirubin 0.3 - 1.2 mg/dL -  Alkaline Phos 38 - 126 U/L -  AST 15 - 41 U/L -  ALT 0 - 44 U/L -   Edinburgh Score: Edinburgh Postnatal Depression Scale Screening Tool  05/30/2021  I have been able to laugh and see the funny side of things. 0  I have looked forward with enjoyment to things. 0  I have blamed myself unnecessarily when things went wrong. 0  I have been anxious or worried for no good reason. 0  I have felt scared or panicky for no good reason. 0  Things have been getting on top of me. 0  I have been so unhappy that I have had difficulty sleeping. 0  I have felt sad or miserable. 0  I have been so unhappy that I have been crying. 0  The thought of harming myself has occurred to me. 0  Edinburgh Postnatal Depression Scale Total 0     After visit meds:  Allergies as of 05/30/2021   No Known Allergies      Medication List     STOP taking these medications    Accu-Chek  Guide test strip Generic drug: glucose blood   Accu-Chek Guide w/Device Kit   Accu-Chek Softclix Lancets lancets   aspirin EC 81 MG tablet       TAKE these medications    acetaminophen 500 MG tablet Commonly known as: TYLENOL Take 2 tablets (1,000 mg total) by mouth every 6 (six) hours.   ferrous sulfate 325 (65 FE) MG tablet Take 1 tablet (325 mg total) by mouth every other day. Start taking on: May 31, 2021   furosemide 20 MG tablet Commonly known as: Lasix Take 1 tablet (20 mg total) by mouth 2 (two) times daily.   ibuprofen 600 MG tablet Commonly known as: ADVIL Take 1 tablet (600 mg total) by mouth every 6 (six) hours as needed for moderate pain, fever or headache.   NIFEdipine 30 MG 24 hr tablet Commonly known as: ADALAT CC Take 1 tablet (30 mg total) by mouth daily. Start taking on: May 31, 2021   oxyCODONE 5 MG immediate release tablet Commonly known as: Oxy IR/ROXICODONE Take 1 tablet (5 mg total) by mouth every 4 (four) hours as needed for moderate pain.   polyethylene glycol powder 17 GM/SCOOP powder Commonly known as: GLYCOLAX/MIRALAX Take 17 g by mouth daily as needed for moderate constipation.   prenatal multivitamin Tabs tablet Take 1 tablet by mouth daily at 12 noon.         Discharge home in stable condition Infant Feeding: Breast Infant Disposition:home with mother Discharge instruction: per After Visit Summary and Postpartum booklet. Activity: Advance as tolerated. Pelvic rest for 6 weeks.  Diet: carb modified diet and iron rich diet Future Appointments: Future Appointments  Date Time Provider [place]  06/03/2021 11:35 AM Aletha Halim, MD [facility]  06/09/2021  1:50 PM Anyanwu, Sallyanne Havers, MD [facility]  06/15/2021  1:30 PM Caren Macadam, MD [facility]   Follow up Visit:  [place] [facility] [place] [facility] Follow up in 1 week(s).    Specialty: Obstetrics and Gynecology Why: for incision adn blood pressure check Contact information: [place] [facility] Follow up.   Specialty: Obstetrics and Gynecology Why: As needed in emergencies Contact information: [address] [id number] [place] [phone]               Message sent to [facility] by Dr. Cy Blamer on 1/27  Please schedule this patient for a In person postpartum visit  in 4 weeks with the following provider: Any provider. Additional Postpartum F/U:2 hour GTT, Incision check 1 week, and BP check 1 week  High risk pregnancy complicated by: GDM and HTN Delivery mode:  C-Section, Low Transverse  Anticipated Birth Control:  Unsure   05/30/2021 Manya Silvas, CNM

## 2021-05-29 ENCOUNTER — Encounter (HOSPITAL_COMMUNITY): Payer: Self-pay | Admitting: Obstetrics and Gynecology

## 2021-05-29 LAB — GLUCOSE, RANDOM: Glucose, Bld: 165 mg/dL — ABNORMAL HIGH (ref 70–99)

## 2021-05-29 LAB — HEMOGLOBIN: Hemoglobin: 9.5 g/dL — ABNORMAL LOW (ref 12.0–15.0)

## 2021-05-29 LAB — GLUCOSE, CAPILLARY: Glucose-Capillary: 129 mg/dL — ABNORMAL HIGH (ref 70–99)

## 2021-05-29 MED ORDER — FERROUS SULFATE 325 (65 FE) MG PO TABS
325.0000 mg | ORAL_TABLET | ORAL | Status: DC
  Administered 2021-05-29: 325 mg via ORAL
  Filled 2021-05-29: qty 1

## 2021-05-29 NOTE — Progress Notes (Signed)
Post Operative Day 1 Subjective: Doing well. No acute events overnight. Pain is controlled and bleeding is appropriate. She is eating, drinking, voiding, ambulating, and passing flatus without issue. She is breast feeding which is going well. She has no other concerns at this time.  Objective: Blood pressure 117/79, pulse 79, temperature 97.7 F (36.5 C), temperature source Oral, resp. rate 18, height 5\' 1"  (1.549 m), weight 78 kg, last menstrual period 09/05/2020, SpO2 100 %, unknown if currently breastfeeding.  Physical Exam:  General: alert, cooperative, and no distress Lochia: appropriate Uterine Fundus: firm and below umbilicus  Incision: healing well, no significant drainage, no significant erythema, dressing is clean, dry, and intact  DVT Evaluation: no LE edema or calf tenderness to palpation  Recent Labs    05/28/21 1124 05/29/21 0429  HGB 12.6 9.5*  HCT 37.9  --     Assessment/Plan: Michelle Schultz is a 31 y.o. G2P2002 on POD#1 s/p rLTCS.  Progressing well. Meeting postpartum milestones. VSS. Continue routine postpartum care.  #Acute blood loss anemia:  - Hgb 12.6 to 9.5 post-operatively  - Asymptomatic  - Will start PO ferrous sulfate   #A1GDM: - Fasting CBG 165 - Thinks she maybe drank some juice within a few hours before this - Will repeat fasting CBG tomorrow AM on 1/29 - Plan for GTT at postpartum visit   #gHTN: - BP stable and at goal on Procardia 30 mg daily - Will continue to monitor   Feeding: Breast  Contraception: Unsure  Dispo: Plan for discharge on POD#2-3.    LOS: 1 day   2/29, MD  05/29/2021, 8:51 AM

## 2021-05-29 NOTE — Lactation Note (Signed)
This note was copied from a baby's chart. Lactation Consultation Note  Patient Name: Michelle Schultz [id number] Date: 05/29/2021   Age:32 hours  Infant feeding well according to mother. Mom visiting with family and will call for latch assistance.      Maternal Data    Feeding    LATCH Score                    Lactation Tools Discussed/Used    Interventions    Discharge    Consult Status      Keola Heninger  Nicholson-Springer 05/29/2021, 1:54 PM

## 2021-05-29 NOTE — Plan of Care (Signed)
Problem: Life Cycle: Goal: Chance of risk for complications during the postpartum period will decrease Outcome: Completed/Met   Problem: Role Relationship: Goal: Ability to demonstrate positive interaction with newborn will improve Outcome: Completed/Met   Problem: Skin Integrity: Goal: Demonstration of wound healing without infection will improve Outcome: Completed/Met   

## 2021-05-29 NOTE — Lactation Note (Signed)
This note was copied from a baby's chart. Lactation Consultation Note  Patient Name: Michelle Schultz [id number]'D Date: 05/29/2021   Age:32 hours P2, ETI female infant. LC entered room mom had visitors, per mom, infant recently breastfed,  Per mom, she feels breastfeeding is going well and RN assisted her earlier with latching infant at the breast. LC asked mom if she would prefer LC to come back later mom declined. LC explained how to use hand pump that was given by RN. Mom doesn't have any questions or concerns for LC at this time. Mom will continue to breastfeed infant according to hunger cues, 8 to 12+ or more times skin to skin. Mom knows she can call RN/LC if she would like further assistance with latching infant at the breast.  Maternal Data    Feeding    LATCH Score              Lactation Tools Discussed/Used    Interventions Interventions: Breast feeding basics reviewed;Assisted with latch;Skin to skin;Breast massage;Hand express;Position options;Support pillows;Adjust position;Expressed milk;Education  Discharge    Consult Status Consult Status: Follow-up Date: 05/30/21 Follow-up type: In-patient    Danelle Earthly 05/29/2021, 7:20 PM

## 2021-05-30 MED ORDER — IBUPROFEN 600 MG PO TABS: 600.0000 mg | ORAL_TABLET | Freq: Four times a day (QID) | ORAL | 0 refills | Status: DC | PRN

## 2021-05-30 MED ORDER — FERROUS SULFATE 325 (65 FE) MG PO TABS: 325.0000 mg | ORAL_TABLET | ORAL | 0 refills | Status: DC

## 2021-05-30 MED ORDER — FUROSEMIDE 20 MG PO TABS: 20.0000 mg | ORAL_TABLET | Freq: Two times a day (BID) | ORAL | 0 refills | Status: DC

## 2021-05-30 MED ORDER — OXYCODONE HCL 5 MG PO TABS: 5.0000 mg | ORAL_TABLET | ORAL | 0 refills | Status: DC | PRN

## 2021-05-30 MED ORDER — NIFEDIPINE ER 30 MG PO TB24: 30.0000 mg | ORAL_TABLET | Freq: Every day | ORAL | 1 refills | Status: DC

## 2021-05-30 MED ORDER — ACETAMINOPHEN 500 MG PO TABS: 1000.0000 mg | ORAL_TABLET | Freq: Four times a day (QID) | ORAL | 0 refills | Status: DC

## 2021-05-30 MED ORDER — POLYETHYLENE GLYCOL 3350 17 GM/SCOOP PO POWD
17.0000 g | Freq: Every day | ORAL | 2 refills | Status: DC | PRN
Start: 2021-05-30 — End: 2022-12-15

## 2021-05-30 NOTE — Lactation Note (Addendum)
This note was copied from a baby's chart. Lactation Consultation Note  Patient Name: Michelle Schultz [id number] Date: 05/30/2021 Reason for consult: Follow-up assessment;Early term 37-38.6wks Age:32 hours  P2, Mother has baby latched when [facility] entered room. Mother denies questions or concerns. FOB asked about formula supplementation.  Discussed options including breastmilk supplementation and pumping. Reviewed engorgement care and monitoring voids/stools. Discussed waking baby for feeds and pumping if baby is sleepy at the breast.  Breast compression can help keep baby active.     Feeding Mother's Current Feeding Choice: Breast Milk  LATCH Score Latch: Grasps breast easily, tongue down, lips flanged, rhythmical sucking. (latched upon entering)  Audible Swallowing: A few with stimulation  Type of Nipple: Everted at rest and after stimulation  Comfort (Breast/Nipple): Soft / non-tender  Hold (Positioning): No assistance needed to correctly position infant at breast.  LATCH Score: 9   Interventions Interventions: Breast feeding basics reviewed;Education  Discharge Discharge Education: Engorgement and breast care;Warning signs for feeding baby  Consult Status Consult Status: Complete Date: 05/30/21    Dahlia Byes [facility] 05/30/2021, 10:17 AM

## 2021-06-02 ENCOUNTER — Encounter: Payer: No Typology Code available for payment source | Admitting: Family Medicine

## 2021-06-03 ENCOUNTER — Encounter: Payer: No Typology Code available for payment source | Admitting: Obstetrics and Gynecology

## 2021-06-03 ENCOUNTER — Other Ambulatory Visit (HOSPITAL_COMMUNITY)
Admission: RE | Admit: 2021-06-03 | Discharge: 2021-06-03 | Disposition: A | Discharge status: Home / Self Care | Payer: No Typology Code available for payment source | Source: Ambulatory Visit | Attending: Obstetrics and Gynecology | Admitting: Obstetrics and Gynecology

## 2021-06-03 ENCOUNTER — Other Ambulatory Visit: Payer: Self-pay

## 2021-06-03 ENCOUNTER — Other Ambulatory Visit: Payer: No Typology Code available for payment source

## 2021-06-03 ENCOUNTER — Ambulatory Visit (INDEPENDENT_AMBULATORY_CARE_PROVIDER_SITE_OTHER): Payer: No Typology Code available for payment source | Admitting: *Deleted

## 2021-06-03 DIAGNOSIS — Z8759 Personal history of other complications of pregnancy, childbirth and the puerperium: Secondary | ICD-10-CM

## 2021-06-03 DIAGNOSIS — Z98891 History of uterine scar from previous surgery: Secondary | ICD-10-CM

## 2021-06-03 HISTORY — DX: Gestational (pregnancy-induced) hypertension without significant proteinuria, unspecified trimester: O13.9

## 2021-06-03 HISTORY — DX: Supervision of pregnancy with other poor reproductive or obstetric history, unspecified trimester: O09.299

## 2021-06-03 HISTORY — DX: Personal history of other complications of pregnancy, childbirth and the puerperium: Z87.59

## 2021-06-03 HISTORY — DX: Gestational diabetes mellitus in pregnancy, unspecified control: O24.419

## 2021-06-03 NOTE — Progress Notes (Signed)
Subjective:  Michelle Schultz is a 31 y.o. female here for BP check and incision check.  Hypertension ROS: taking medications as instructed, no medication side effects noted, no TIA's, no chest pain on exertion, no dyspnea on exertion, and no swelling of ankles.   Pt reports incision healing well  Objective:  BP 118/81    Pulse 88   Appearance alert, well appearing, and in no distress. Incision healing well   Assessment:   Blood Pressure today in office well controlled.  Incision healed nicely, no signs of infection, scar looks good  Plan:  Follow up as needed and or at postpartum visit.  Marland Kitchen

## 2021-06-05 ENCOUNTER — Encounter (HOSPITAL_COMMUNITY)
Admission: RE | Disposition: A | Discharge status: Home / Self Care | Payer: Self-pay | Source: Home / Self Care | Attending: Obstetrics and Gynecology

## 2021-06-05 SURGERY — Surgical Case
Anesthesia: Regional

## 2021-06-07 NOTE — Progress Notes (Signed)
Patient was assessed and managed by nursing staff during this encounter. I have reviewed the chart and agree with the documentation and plan. I have also made any necessary editorial changes.  [place] Bing, MD 06/07/2021 11:32 AM

## 2021-06-09 ENCOUNTER — Encounter: Payer: No Typology Code available for payment source | Admitting: Obstetrics & Gynecology

## 2021-06-09 ENCOUNTER — Other Ambulatory Visit: Payer: No Typology Code available for payment source

## 2021-06-15 ENCOUNTER — Encounter: Payer: No Typology Code available for payment source | Admitting: Family Medicine

## 2021-06-23 ENCOUNTER — Encounter: Payer: No Typology Code available for payment source | Admitting: Family Medicine

## 2021-06-28 ENCOUNTER — Encounter: Payer: Self-pay | Admitting: Obstetrics and Gynecology

## 2021-06-28 ENCOUNTER — Other Ambulatory Visit: Payer: Self-pay | Admitting: *Deleted

## 2021-06-28 MED ORDER — SLYND 4 MG PO TABS: 4.0000 mg | ORAL_TABLET | Freq: Every day | ORAL | 11 refills | Status: DC

## 2021-07-02 ENCOUNTER — Other Ambulatory Visit: Payer: Self-pay | Admitting: *Deleted

## 2021-07-02 MED ORDER — SLYND 4 MG PO TABS: 4.0000 mg | ORAL_TABLET | Freq: Every day | ORAL | 11 refills | Status: DC

## 2021-07-05 ENCOUNTER — Ambulatory Visit: Payer: No Typology Code available for payment source | Admitting: Family Medicine

## 2021-07-13 ENCOUNTER — Ambulatory Visit: Payer: No Typology Code available for payment source | Admitting: Obstetrics & Gynecology

## 2022-03-15 IMAGING — US US MFM OB DETAIL+14 WK
1 series · 13 of 28 positions shown · non-contrast
Comparison: none

[Series 1: us mfm ob detail+14 wk · 13 of 94 slices shown]
[im 4/94]
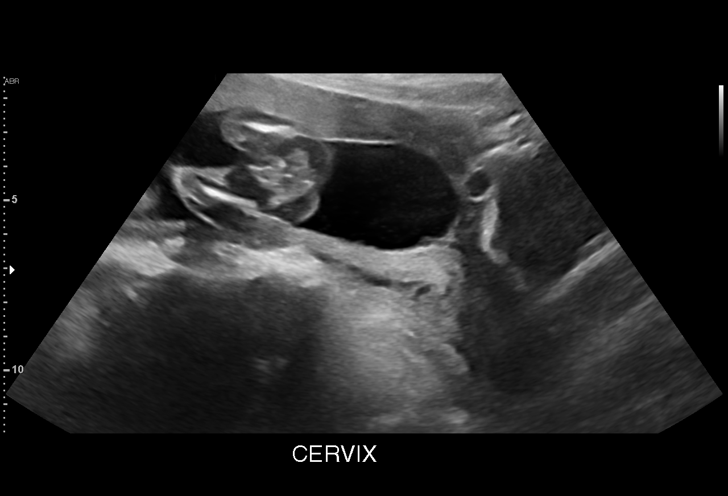
[im 11/94]
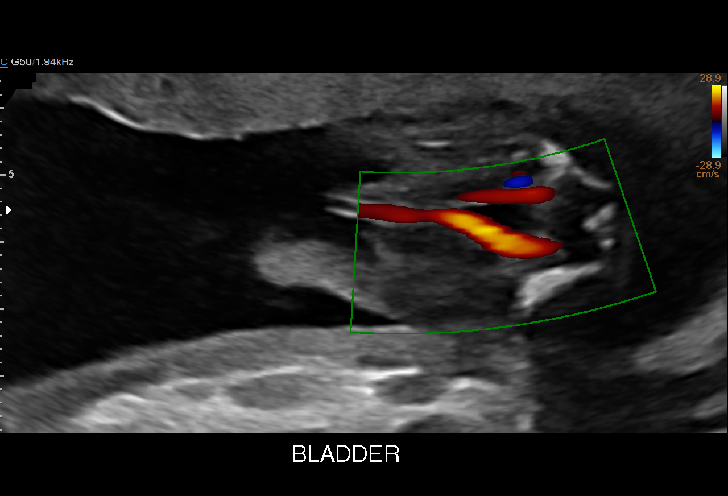
[im 18/94]
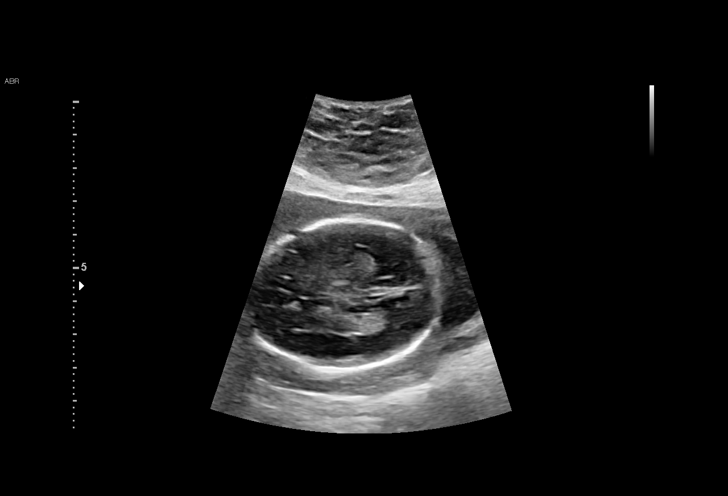
[im 25/94]
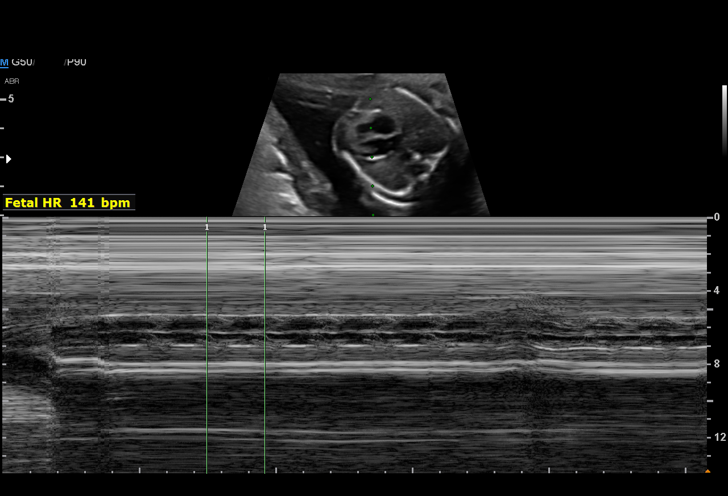
[im 32/94]
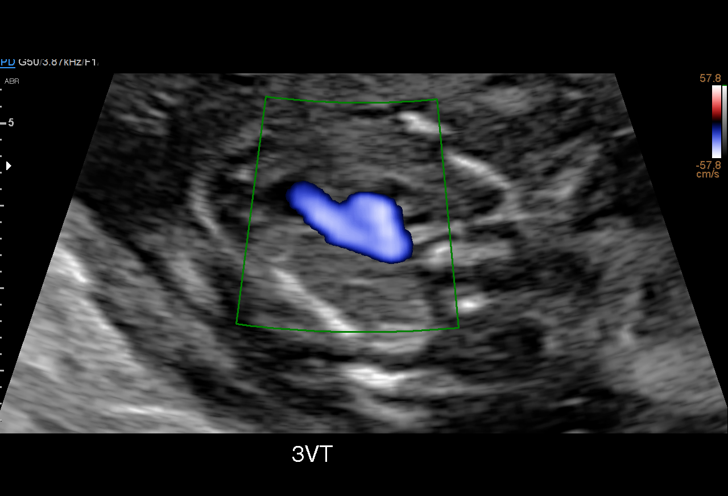
[im 38/94]
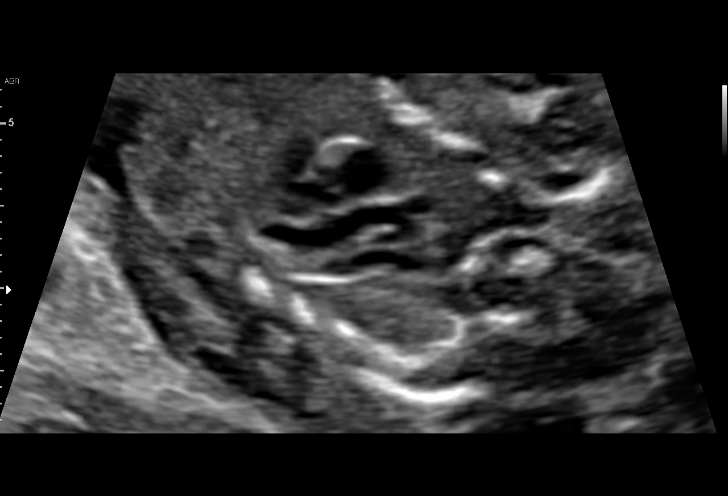
[im 49/94]
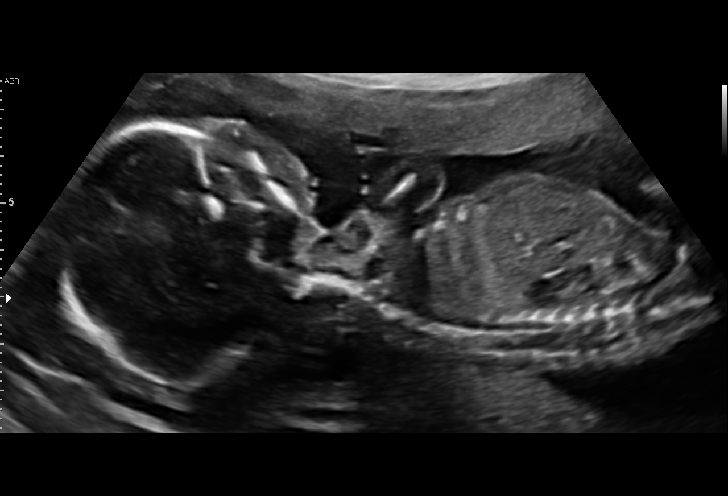
[im 56/94]
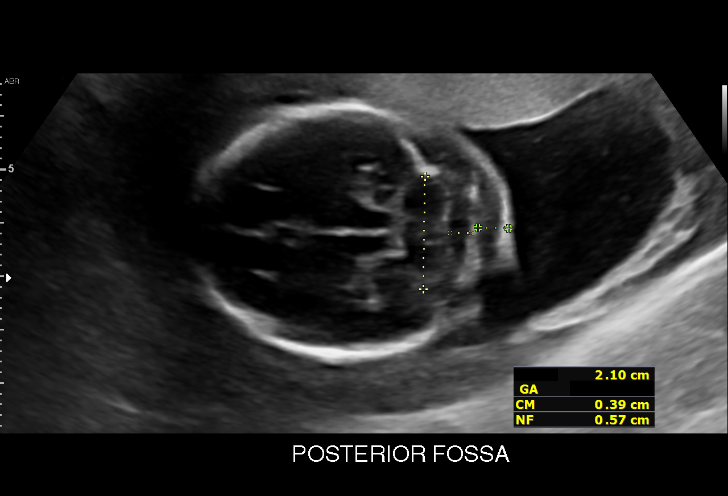
[im 63/94]
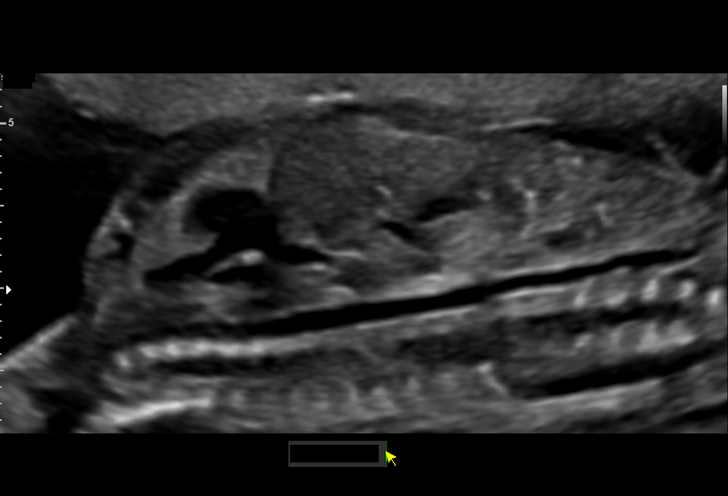
[im 69/94]
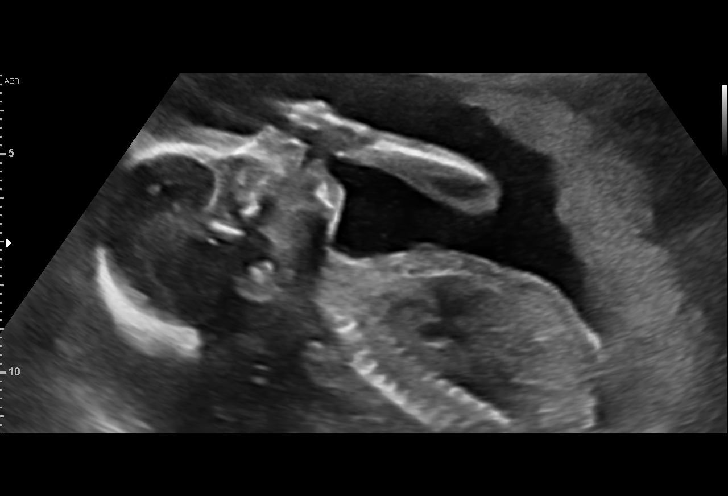
[im 76/94]
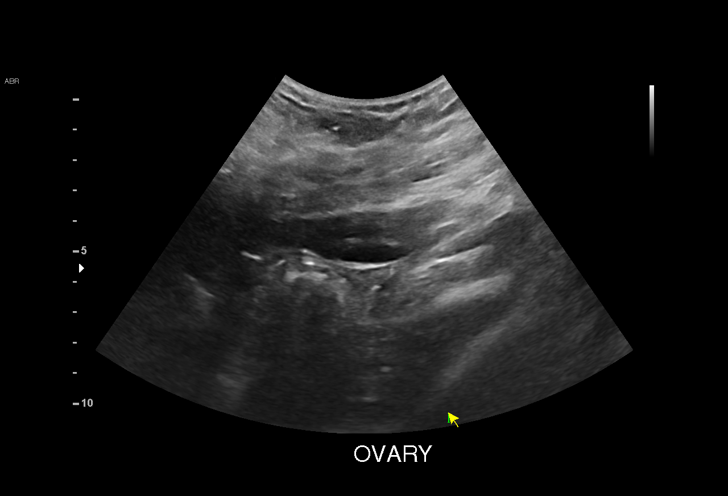
[im 83/94]
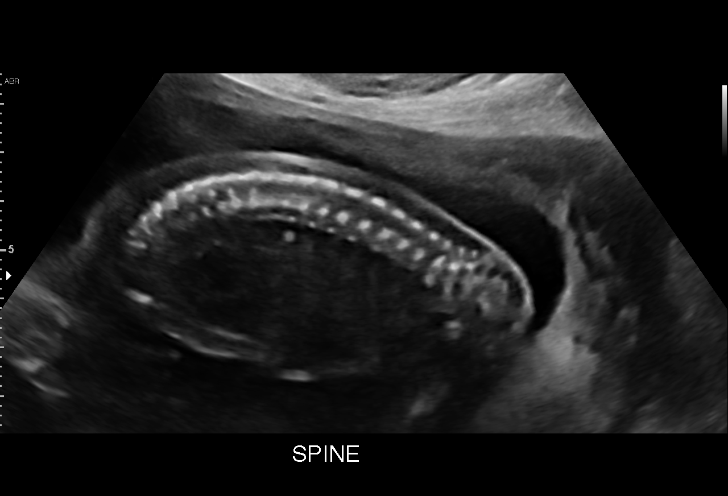
[im 90/94]
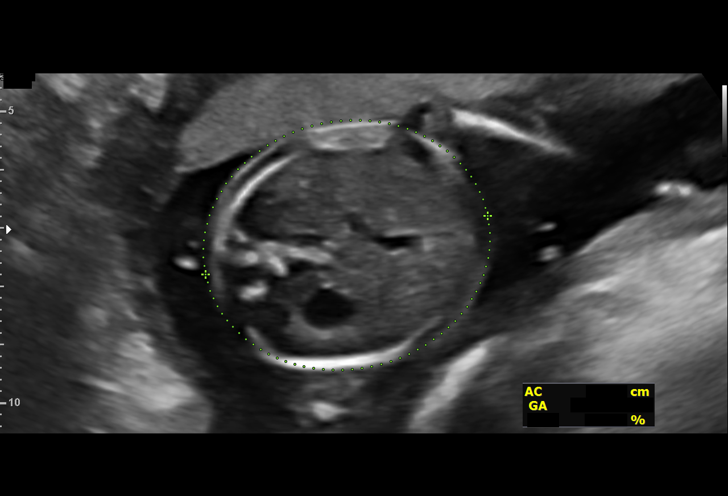

[13 of 28 positions shown; findings below may reference images not displayed]

Indications

 Encounter for antenatal screening for
 malformations
 Poor obstetric history: Previous
 preeclampsia / eclampsia/gestational HTN
 Previous cesarean delivery, antepartum
 19 weeks gestation of pregnancy
Fetal Evaluation

 Num Of Fetuses:         1
 Fetal Heart Rate(bpm):  141
 Cardiac Activity:       Observed
 Presentation:           Variable
 Placenta:               Posterior

 Amniotic Fluid
 AFI FV:      Within normal limits

                             Largest Pocket(cm)

Biometry

 BPD:      45.7  mm     G. Age:  19w 6d         55  %    CI:        69.36   %    70 - 86
                                                         FL/HC:      16.7   %    16.8 -
 HC:      175.2  mm     G. Age:  20w 0d         58  %    HC/AC:      1.21        1.09 -
 AC:      144.6  mm     G. Age:  19w 5d         47  %    FL/BPD:     64.1   %
 FL:       29.3  mm     G. Age:  19w 0d         20  %    FL/AC:      20.3   %    20 - 24
 CER:        21  mm     G. Age:  20w 0d         75  %
 NFT:       5.7  mm

 LV:        5.4  mm
 CM:        3.9  mm
 Est. FW:     296  gm    0 lb 10 oz      33  %
OB History

 Gravidity:    2         Term:   1
 Living:       1
Gestational Age

 LMP:           19w 5d        Date:  09/05/20                 EDD:   06/12/21
 U/S Today:     19w 5d                                        EDD:   06/12/21
 Best:          19w 5d     Det. By:  LMP  (09/05/20)          EDD:   06/12/21
Anatomy

 Cranium:               Appears normal         Aortic Arch:            Appears normal
 Cavum:                 Appears normal         Ductal Arch:            Appears normal
 Ventricles:            Appears normal         Diaphragm:              Appears normal
 Choroid Plexus:        Appears normal         Stomach:                Appears normal, left
                                                                       sided
 Cerebellum:            Appears normal         Abdomen:                Appears normal
 Posterior Fossa:       Appears normal         Abdominal Wall:         Appears nml (cord
                                                                       insert, abd wall)
 Nuchal Fold:           Appears normal         Cord Vessels:           Appears normal (3
                                                                       vessel cord)
 Face:                  Appears normal         Kidneys:                Appear normal
                        (orbits and profile)
 Lips:                  Appears normal         Bladder:                Appears normal
 Thoracic:              Appears normal         Spine:                  Appears normal
 Heart:                 Appears normal         Upper Extremities:      Appears normal
                        (4CH, axis, and
                        situs)
 RVOT:                  Appears normal         Lower Extremities:      Appears normal
 LVOT:                  Appears normal

 Other:  Fetus appears to be female.
Targeted Anatomy

 Thorax
 SVC:                   Appears normal         3 V Trachea View:       Appears normal
 3 Vessel View:         Appears normal         IVC:                    Appears normal
Cervix Uterus Adnexa

 Cervix
 Length:           3.85  cm.
 Normal appearance by transabdominal scan.

 Right Ovary
 Within normal limits.

 Left Ovary
 Within normal limits.
Impression

 G2 P1. Patient is here for fetal anatomy scan. On cell-free
 fetal DNA screening, the risks of aneuploidies are not
 increased. MSAFP screening showed low risk for open-neural
 tube defects.
 Obstetrical history significant for a term cesarean delivery 4
 years ago of a male infant weighing 2555 g at birth. Her
 pregnancy was complicated by preeclampsia.
 She takes low-dose aspirin prophylaxis.
 Patient reports no chronic medical conditions.
 We performed a fetal anatomy scan. No markers of
 aneuploidies or fetal structural defects are seen. Fetal
 biometry is consistent with her previously-established dates.
 Amniotic fluid is normal and good fetal activity is seen.
 Patient understands the limitations of ultrasound in detecting
 fetal anomalies.
 Placenta is anterior and there is no evidence of previa or
 placenta accrete spectrum.
Recommendations

 -An appointment was made for her to return in 8 weeks for
 fetal growth assessment.
                 Maule, Haijin

## 2022-06-14 IMAGING — US US MFM OB FOLLOW-UP
1 series · 14 of 26 positions shown · non-contrast
Comparison: none

[Series 1: us mfm ob follow-up · 26 acquisitions, 14 frames shown]
[im 1/26]
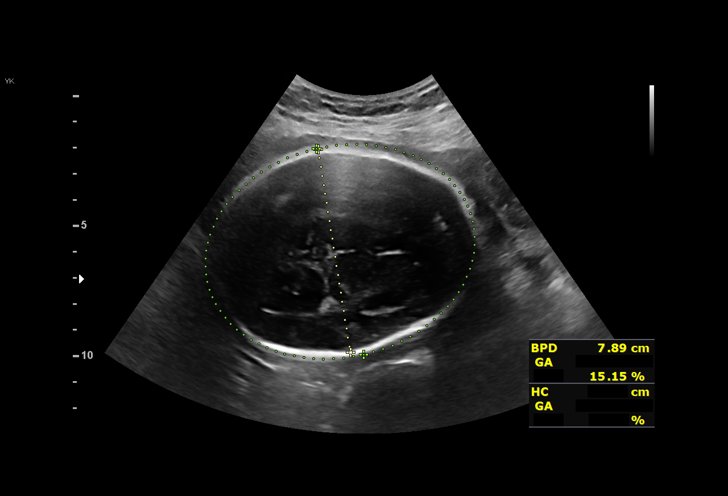
[im 3/26]
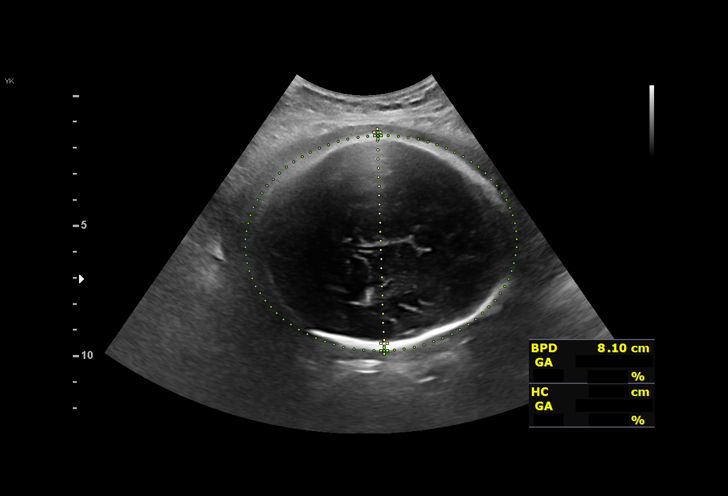
[im 5/26]
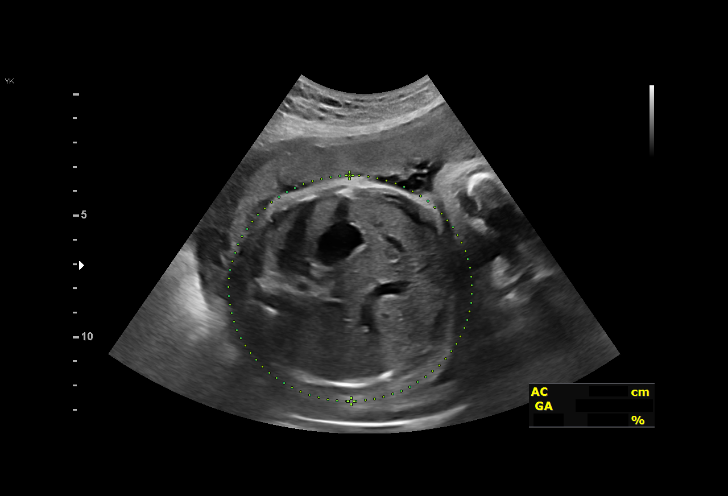
[im 7/26]
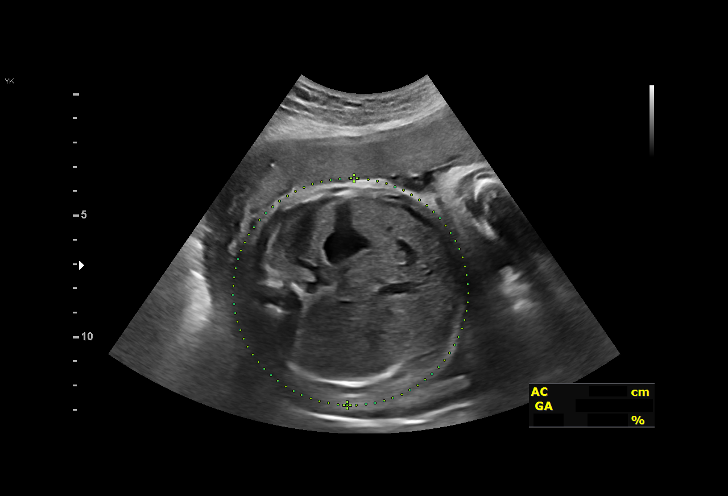
[im 9/26]
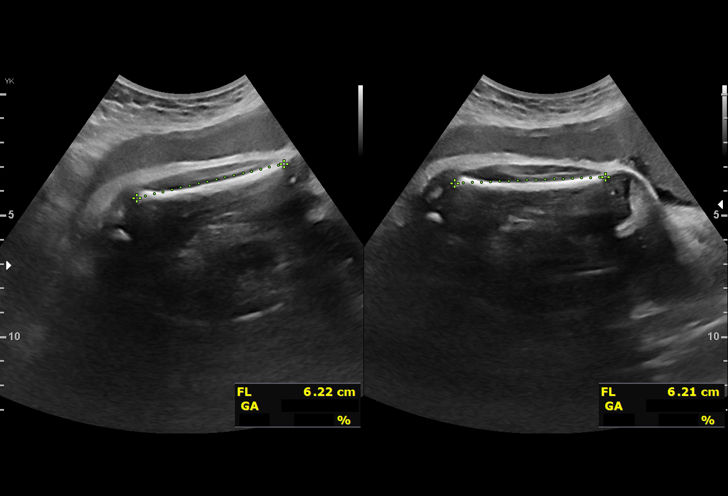
[im 11/26]
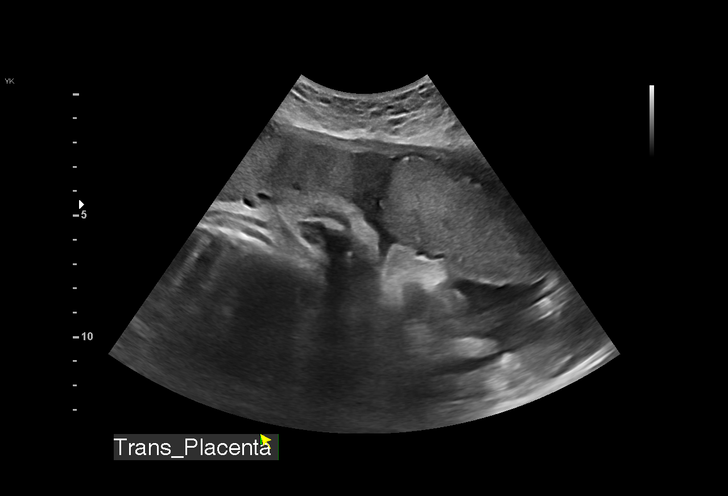
[im 13/26]
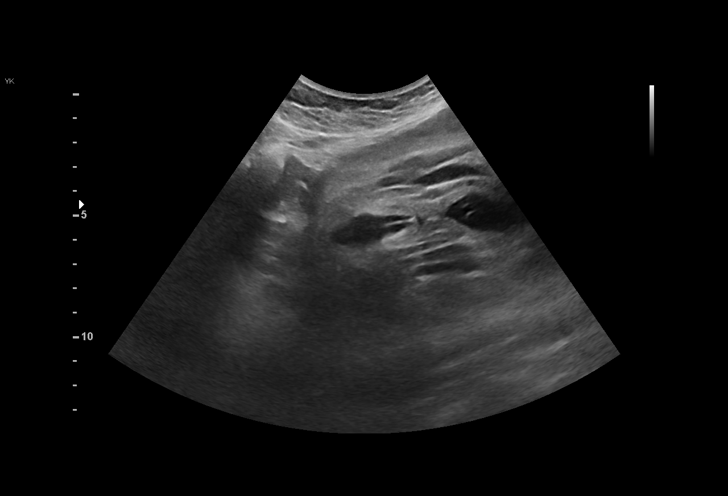
[im 14/26]
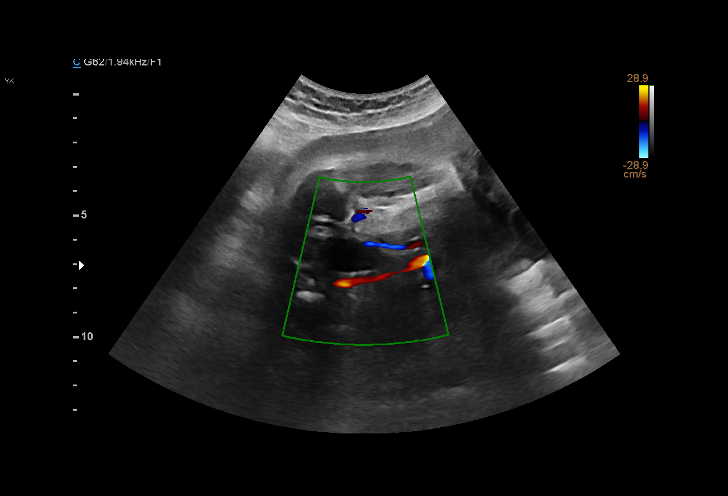
[im 16/26]
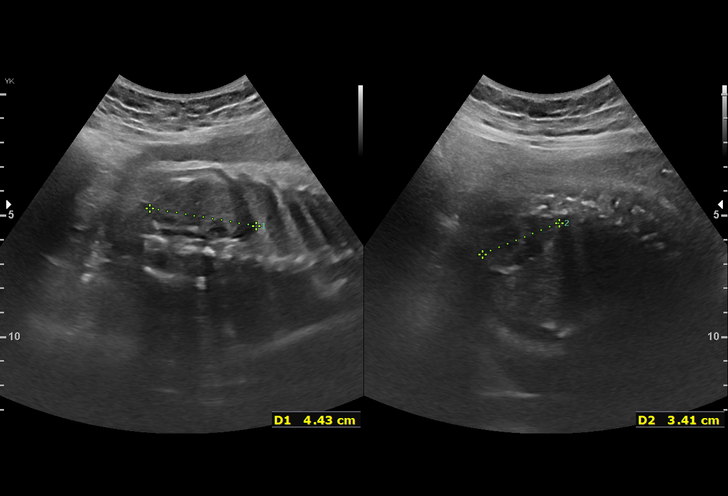
[im 18/26]
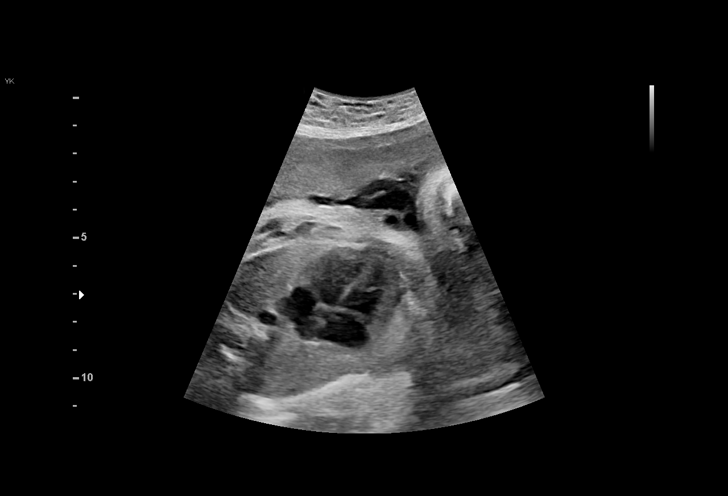
[im 20/26]
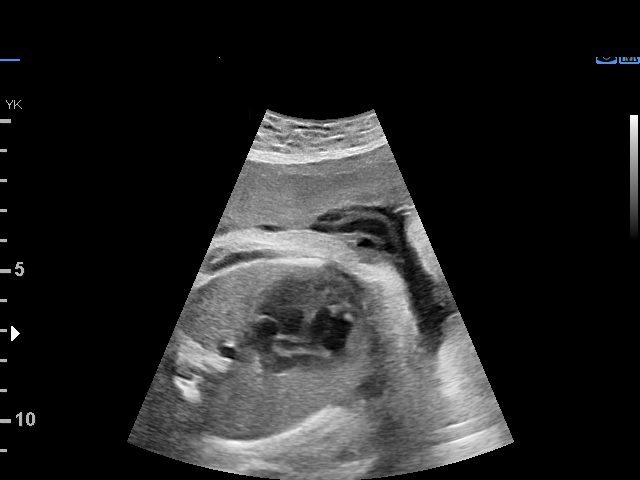
[im 22/26]
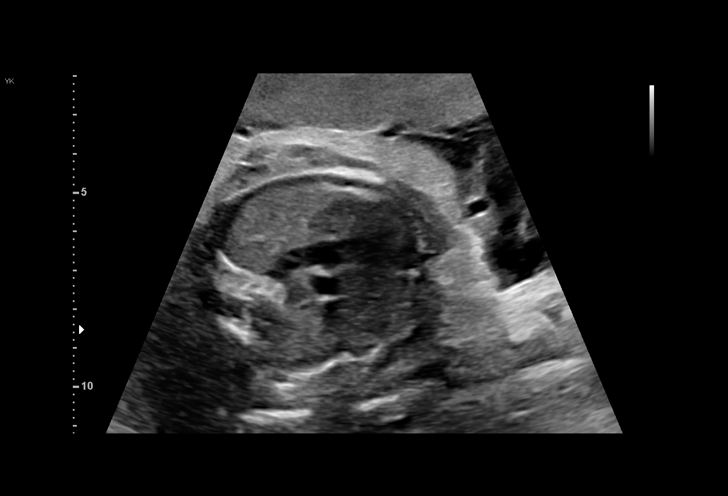
[im 24/26]
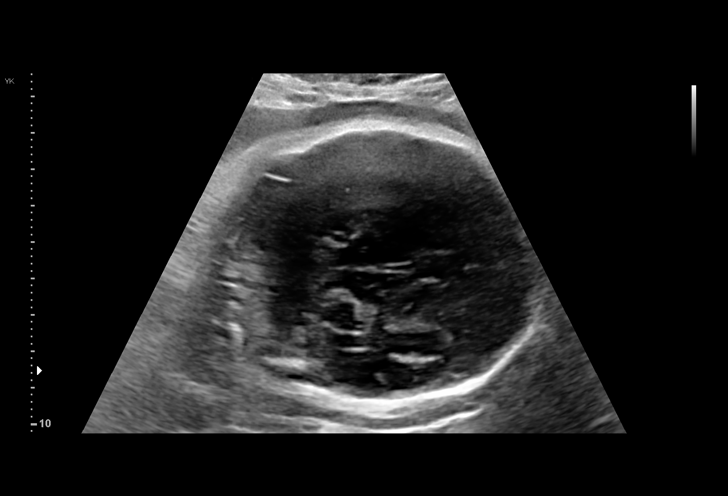
[im 26/26]
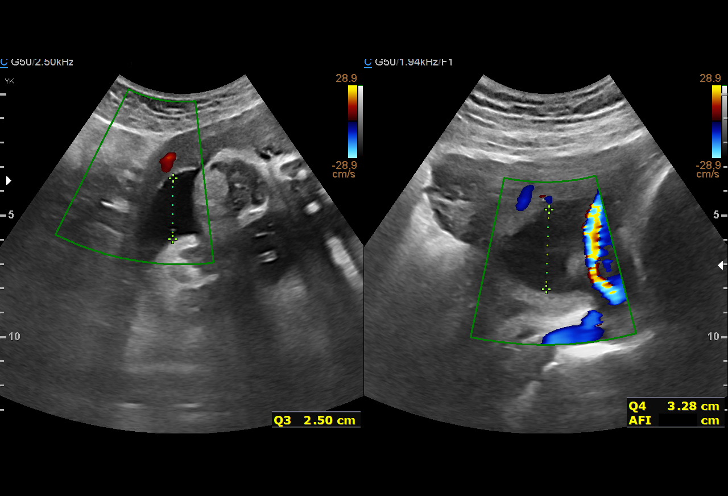

[14 of 26 positions shown; findings below may reference images not displayed]

JONI

Indications

 Poor obstetric history: Previous
 preeclampsia / eclampsia/gestational HTN
 Encounter for antenatal screening for
 malformations
 Previous cesarean delivery, antepartum
 Diabetes - Gestational
 32 weeks gestation of pregnancy
Fetal Evaluation

 Num Of Fetuses:         1
 Fetal Heart Rate(bpm):  148
 Cardiac Activity:       Observed
 Presentation:           Cephalic
 Placenta:               Anterior
 P. Cord Insertion:      Previously Visualized

 Amniotic Fluid
 AFI FV:      Within normal limits

 AFI Sum(cm)     %Tile       Largest Pocket(cm)
 14.4            50

 RUQ(cm)       RLQ(cm)       LUQ(cm)        LLQ(cm)

Biometry

 BPD:      79.9  mm     G. Age:  32w 1d         24  %    CI:        73.33   %    70 - 86
                                                         FL/HC:      20.9   %    19.9 -
 HC:      296.5  mm     G. Age:  32w 6d         16  %    HC/AC:      0.99        0.96 -
 AC:      299.2  mm     G. Age:  33w 6d         82  %    FL/BPD:     77.5   %    71 - 87
 FL:       61.9  mm     G. Age:  32w 1d         22  %    FL/AC:      20.7   %    20 - 24

 Est. FW:    9666  gm    4 lb 11 oz      52  %
OB History

 Gravidity:    2         Term:   1
 Living:       1
Gestational Age

 LMP:           32w 5d        Date:  09/05/20                 EDD:   06/12/21
 U/S Today:     32w 5d                                        EDD:   06/12/21
 Best:          32w 5d     Det. By:  LMP  (09/05/20)          EDD:   06/12/21
Anatomy

 Cranium:               Appears normal         Aortic Arch:            Appears normal
 Cavum:                 Appears normal         Ductal Arch:            Previously seen
 Ventricles:            Appears normal         Diaphragm:              Appears normal
 Choroid Plexus:        Previously seen        Stomach:                Appears normal, left
                                                                       sided
 Cerebellum:            Previously seen        Abdomen:                Appears normal
 Posterior Fossa:       Previously seen        Abdominal Wall:         Previously seen
 Nuchal Fold:           Previously seen        Cord Vessels:           Appears normal (3
                                                                       vessel cord)
 Face:                  Profile appears        Kidneys:                Appear normal
                        normal
 Lips:                  Previously seen        Bladder:                Appears normal
 Thoracic:              Appears normal         Spine:                  Previously seen
 Heart:                 Appears normal         Upper Extremities:      Previously seen
                        (4CH, axis, and
                        situs)
 RVOT:                  Previously seen        Lower Extremities:      Previously seen
 LVOT:                  Previously seen

 Other:  Female gender previously seen.
Impression

 Ms. Samajurebi is a 30 yo G2P1 who is here at 32 w 5d for a
 follow up growth due to YGKV8 and review of her blood
 sugars.

 She is seen at the request of [place], [place]

 Normal interval growth with measurements consistent with
 dates
 Good fetal movement and amniotic fluid volume

 I reviewed Ms. Samajurebi blood sugars. she has 8 of 12
 elevated right around 90-94 on average. We reviewed her
 diet and exercise habits. She will begin riding her bike at
 night and continue a balanced carb dinner.

 She will report her blood sugars in 1 week.

 If her FBS remain elevated I recommend we start metformin
 at night 500 mg.

 All questions answered.

 I spent 20 minutes with > 50% in direct consultation.

## 2022-08-15 LAB — OB RESULTS CONSOLE HGB/HCT, BLOOD
HCT: 41 (ref 29–41)
Hemoglobin: 12.3

## 2022-08-15 LAB — OB RESULTS CONSOLE RPR: RPR: NONREACTIVE

## 2022-08-15 LAB — OB RESULTS CONSOLE HIV ANTIBODY (ROUTINE TESTING): HIV: NONREACTIVE

## 2022-08-15 LAB — OB RESULTS CONSOLE HEPATITIS B SURFACE ANTIGEN: Hepatitis B Surface Ag: NEGATIVE

## 2022-08-15 LAB — HEPATITIS C ANTIBODY: HCV Ab: NEGATIVE

## 2022-08-15 LAB — OB RESULTS CONSOLE RUBELLA ANTIBODY, IGM: Rubella: IMMUNE

## 2022-08-15 LAB — OB RESULTS CONSOLE VARICELLA ZOSTER ANTIBODY, IGG: Varicella: IMMUNE

## 2022-08-15 LAB — OB RESULTS CONSOLE PLATELET COUNT: Platelets: 281

## 2022-08-15 LAB — OB RESULTS CONSOLE GC/CHLAMYDIA
Chlamydia: NEGATIVE
Neisseria Gonorrhea: NEGATIVE

## 2022-12-08 ENCOUNTER — Ambulatory Visit (INDEPENDENT_AMBULATORY_CARE_PROVIDER_SITE_OTHER): Payer: BLUE CROSS/BLUE SHIELD | Admitting: Obstetrics and Gynecology

## 2022-12-08 ENCOUNTER — Encounter: Payer: Self-pay | Admitting: Obstetrics and Gynecology

## 2022-12-08 VITALS — BP 136/86 | HR 101 | Wt 173.0 lb

## 2022-12-08 DIAGNOSIS — Z98891 History of uterine scar from previous surgery: Secondary | ICD-10-CM

## 2022-12-08 DIAGNOSIS — Z8759 Personal history of other complications of pregnancy, childbirth and the puerperium: Secondary | ICD-10-CM

## 2022-12-08 DIAGNOSIS — Z1339 Encounter for screening examination for other mental health and behavioral disorders: Secondary | ICD-10-CM | POA: Diagnosis not present

## 2022-12-08 DIAGNOSIS — O09299 Supervision of pregnancy with other poor reproductive or obstetric history, unspecified trimester: Secondary | ICD-10-CM

## 2022-12-08 DIAGNOSIS — O131 Gestational [pregnancy-induced] hypertension without significant proteinuria, first trimester: Secondary | ICD-10-CM

## 2022-12-08 DIAGNOSIS — O0993 Supervision of high risk pregnancy, unspecified, third trimester: Secondary | ICD-10-CM

## 2022-12-08 DIAGNOSIS — Z3A24 24 weeks gestation of pregnancy: Secondary | ICD-10-CM

## 2022-12-08 NOTE — Progress Notes (Signed)
   PRENATAL VISIT NOTE  Transfer of care from [facility]  Subjective:  Michelle Schultz is a 32 y.o. G3P2002 at [redacted]w[redacted]d being seen today for ongoing prenatal care.  She is currently monitored for the following issues for this low-risk pregnancy and has Previous cesarean delivery for failure to progress, antepartum; Rubella non-immune status, antepartum; Supervision of high-risk pregnancy; History of HELLP, currently pregnant; History of postpartum hemorrhage; Gestational diabetes; Transient hypertension of pregnancy in third trimester; History of gestational hypertension; History of cesarean section; and Status post repeat low transverse cesarean section on their problem list.  Patient reports no complaints.  Contractions: Not present. Vag. Bleeding: None.  Movement: Present. Denies leaking of fluid.   The following portions of the patient's history were reviewed and updated as appropriate: allergies, current medications, past family history, past medical history, past social history, past surgical history and problem list.   Objective:   Vitals:   12/08/22 1358 12/08/22 1410  BP: (!) 145/87 136/86  Pulse: 92 (!) 101  Weight: 173 lb (78.5 kg)     Fetal Status: Fetal Heart Rate (bpm): 154   Movement: Present     General:  Alert, oriented and cooperative. Patient is in no acute distress.  Skin: Skin is warm and dry. No rash noted.   Cardiovascular: Normal heart rate noted  Respiratory: Normal respiratory effort, no problems with respiration noted  Abdomen: Soft, gravid, appropriate for gestational age.  Pain/Pressure: Absent     Pelvic: Cervical exam deferred        Extremities: Normal range of motion.  Edema: None  Mental Status: Normal mood and affect. Normal behavior. Normal judgment and thought content.   Assessment and Plan:  Pregnancy: G3P2002 at [redacted]w[redacted]d 1. [redacted] weeks gestation of pregnancy  Records reviewed LMP 06/11/2022, [facility] 03/24/2023 based off of early u/s 10/28/22: normal EFW  with normal anatomy u/s except a unilateral CP cyst seen with negative mat21 testing 08/2022: A pos, antibody neg/cbc wnl/vdrl NR/hepB surface ag neg/hiv neg/gc-ct neg/rubella non immune/hcv neg/afp [id number] neg/neg 24h protein, ast, alt/urine culture; failed early 1h but passed 3h  2. History of gestational hypertension May have CHTN due to transient elevation at <20wks. Continue to follow closely. Continue asa 162  3. History of cesarean section X2. D/w pt re: delivery mode next visit  4. Supervision of high risk pregnancy in third trimester 28wk labs next visit  5. Transient hypertension of pregnancy in first trimester See above  6. History of HELLP, currently pregnant See above  Preterm labor symptoms and general obstetric precautions including but not limited to vaginal bleeding, contractions, leaking of fluid and fetal movement were reviewed in detail with the patient. Please refer to After Visit Summary for other counseling recommendations.   Return in about 2 weeks (around 12/22/2022) for 2-3wk , in person, with dr Michelle Schultz, fasting 2hr GTT.  Future Appointments  Date Time Provider Department Center  12/20/2022  8:30 AM [facility]  12/20/2022  8:35 AM Michelle Bing, MD [facility]  01/03/2023 11:15 AM [place] Bing, MD [facility]  01/17/2023  8:55 AM Michelle Hock, MD [facility]    Michelle Bing, MD

## 2022-12-08 NOTE — Patient Instructions (Signed)
Continue to check your blood pressures once a day Call the office for blood pressures that are consistently above 140 for the top number or 90 for the bottom number   Hypertension During Pregnancy Hypertension is also called high blood pressure. High blood pressure means that the force of your blood moving in your body is too strong. It can cause problems for you and your baby. Different types of high blood pressure can happen during pregnancy. The types are: High blood pressure before you got pregnant. This is called chronic hypertension.  This can continue during your pregnancy. Your doctor will want to keep checking your blood pressure. You may need medicine to keep your blood pressure under control while you are pregnant. You will need follow-up visits after you have your baby. High blood pressure that goes up during pregnancy when it was normal before. This is called gestational hypertension. It will usually get better after you have your baby, but your doctor will need to watch your blood pressure to make sure that it is getting better. Very high blood pressure during pregnancy. This is called preeclampsia. Very high blood pressure is an emergency that needs to be checked and treated right away. You may develop very high blood pressure after giving birth. This is called postpartum preeclampsia. This usually occurs within 48 hours after childbirth but may occur up to 6 weeks after giving birth. This is rare. How does this affect me? If you have high blood pressure during pregnancy, you have a higher chance of developing high blood pressure: As you get older. If you get pregnant again. In some cases, high blood pressure during pregnancy can cause: Stroke. Heart attack. Damage to the kidneys, lungs, or liver. Preeclampsia. Jerky movements you cannot control (convulsions or seizures). Problems with the placenta.  What can I do to lower my risk?  Keep a healthy weight. Eat a healthy  diet. Follow what your doctor tells you about treating any medical problems that you had before becoming pregnant. It is very important to go to all of your doctor visits. Your doctor will check your blood pressure and make sure that your pregnancy is progressing as it should. Treatment should start early if a problem is found.  Follow these instructions at home:  Take your blood pressure 1-2 times per day. Call the office if your blood pressure is 155 or higher for the top number or 105 or higher for the bottom number.    Eating and drinking  Drink enough fluid to keep your pee (urine) pale yellow. Avoid caffeine. Lifestyle Do not use any products that contain nicotine or tobacco, such as cigarettes, e-cigarettes, and chewing tobacco. If you need help quitting, ask your doctor. Do not use alcohol or drugs. Avoid stress. Rest and get plenty of sleep. Regular exercise can help. Ask your doctor what kinds of exercise are best for you. General instructions Take over-the-counter and prescription medicines only as told by your doctor. Keep all prenatal and follow-up visits as told by your doctor. This is important. Contact a doctor if: You have symptoms that your doctor told you to watch for, such as: Headaches. Nausea. Vomiting. Belly (abdominal) pain. Dizziness. Light-headedness. Get help right away if: You have: Very bad belly pain that does not get better with treatment. A very bad headache that does not get better. Vomiting that does not get better. Sudden, fast weight gain. Sudden swelling in your hands, ankles, or face. Blood in your pee. Blurry vision. Double vision.  Shortness of breath. Chest pain. Weakness on one side of your body. Trouble talking. Summary High blood pressure is also called hypertension. High blood pressure means that the force of your blood moving in your body is too strong. High blood pressure can cause problems for you and your baby. Keep all  follow-up visits as told by your doctor. This is important. This information is not intended to replace advice given to you by your health care provider. Make sure you discuss any questions you have with your health care provider. Document Released: 05/21/2010 Document Revised: 08/09/2018 Document Reviewed: 05/15/2018 Elsevier Patient Education  2020 [employer].

## 2022-12-08 NOTE — Progress Notes (Signed)
OB Transfer   Hx of preeclampsia  B/P elevated today denies any HA's, no visual changes, no swelling, no upper abdominal pain.  CC: None

## 2022-12-20 ENCOUNTER — Ambulatory Visit (INDEPENDENT_AMBULATORY_CARE_PROVIDER_SITE_OTHER): Payer: BLUE CROSS/BLUE SHIELD | Admitting: Obstetrics and Gynecology

## 2022-12-20 ENCOUNTER — Other Ambulatory Visit: Payer: BLUE CROSS/BLUE SHIELD

## 2022-12-20 VITALS — BP 116/80 | HR 82 | Wt 173.8 lb

## 2022-12-20 DIAGNOSIS — O3503X0 Maternal care for (suspected) central nervous system malformation or damage in fetus, choroid plexus cysts, not applicable or unspecified: Secondary | ICD-10-CM | POA: Insufficient documentation

## 2022-12-20 DIAGNOSIS — Z3A26 26 weeks gestation of pregnancy: Secondary | ICD-10-CM

## 2022-12-20 DIAGNOSIS — Z98891 History of uterine scar from previous surgery: Secondary | ICD-10-CM

## 2022-12-20 DIAGNOSIS — O0993 Supervision of high risk pregnancy, unspecified, third trimester: Secondary | ICD-10-CM

## 2022-12-20 DIAGNOSIS — Z8632 Personal history of gestational diabetes: Secondary | ICD-10-CM

## 2022-12-20 DIAGNOSIS — O09299 Supervision of pregnancy with other poor reproductive or obstetric history, unspecified trimester: Secondary | ICD-10-CM

## 2022-12-20 DIAGNOSIS — O132 Gestational [pregnancy-induced] hypertension without significant proteinuria, second trimester: Secondary | ICD-10-CM

## 2022-12-20 NOTE — Progress Notes (Signed)
ROB: coliform present in the well water per patient as they just moved into a new house   Wants to think about

## 2022-12-20 NOTE — Progress Notes (Signed)
   PRENATAL VISIT NOTE  Subjective:  Michelle Schultz is a 32 y.o. G3P2002 at [redacted]w[redacted]d being seen today for ongoing prenatal care.  She is currently monitored for the following issues for this high-risk pregnancy and has Rubella non-immune status, antepartum; Supervision of high-risk pregnancy; History of HELLP, currently pregnant; History of postpartum hemorrhage; History of gestational diabetes mellitus (GDM); Transient hypertension of pregnancy in second trimester; History of gestational hypertension; History of cesarean section; and Choroid plexus cysts, fetal, affecting care of mother, antepartum on their problem list.  Patient reports no complaints.  Contractions: Not present. Vag. Bleeding: None.  Movement: Present. Denies leaking of fluid.   The following portions of the patient's history were reviewed and updated as appropriate: allergies, current medications, past family history, past medical history, past social history, past surgical history and problem list.   Objective:   Vitals:   12/20/22 0848  BP: 116/80  Pulse: 82  Weight: 173 lb 12.8 oz (78.8 kg)    Fetal Status: Fetal Heart Rate (bpm): 142 Fundal Height: 26 cm Movement: Present     General:  Alert, oriented and cooperative. Patient is in no acute distress.  Skin: Skin is warm and dry. No rash noted.   Cardiovascular: Normal heart rate noted  Respiratory: Normal respiratory effort, no problems with respiration noted  Abdomen: Soft, gravid, appropriate for gestational age.  Pain/Pressure: Absent     Pelvic: Cervical exam deferred        Extremities: Normal range of motion.  Edema: None  Mental Status: Normal mood and affect. Normal behavior. Normal judgment and thought content.   Assessment and Plan:  Pregnancy: G3P2002 at [redacted]w[redacted]d 1. [redacted] weeks gestation of pregnancy 28wk labs today Well water getting treated today. Recommend bottled in the interim.   2. Transient hypertension of pregnancy in second trimester Normal  today. Continue on ASA 162  3. Supervision of high risk pregnancy in third trimester  4. History of cesarean X2. D/w pt re: deliveyr mode next visit  5. History of HELLP, currently pregnant  6. History of gestational diabetes mellitus (GDM)  Preterm labor symptoms and general obstetric precautions including but not limited to vaginal bleeding, contractions, leaking of fluid and fetal movement were reviewed in detail with the patient. Please refer to After Visit Summary for other counseling recommendations.   No follow-ups on file.  Future Appointments  Date Time Provider Department Center  01/03/2023 11:15 AM [place] Bing, MD [facility]  01/17/2023  8:55 AM [place] Hock, MD [facility]  01/31/2023  8:35 AM [place] Bing, MD [facility]  02/14/2023  8:55 AM [place] Bing, MD [facility]  02/28/2023  8:35 AM Anyanwu, Jethro Bastos, MD [facility]     Bing, MD

## 2022-12-21 ENCOUNTER — Other Ambulatory Visit: Payer: Self-pay | Admitting: *Deleted

## 2022-12-21 ENCOUNTER — Encounter: Payer: Self-pay | Admitting: Obstetrics and Gynecology

## 2022-12-21 LAB — CBC
Hematocrit: 34.1 % (ref 34.0–46.6)
Hemoglobin: 11.6 g/dL (ref 11.1–15.9)
MCH: 29.4 pg (ref 26.6–33.0)
MCHC: 34 g/dL (ref 31.5–35.7)
MCV: 86 fL (ref 79–97)
Platelets: 176 10*3/uL (ref 150–450)
RBC: 3.95 x10E6/uL (ref 3.77–5.28)
RDW: 12.2 % (ref 11.7–15.4)
WBC: 7.6 10*3/uL (ref 3.4–10.8)

## 2022-12-21 LAB — GLUCOSE TOLERANCE, 2 HOURS W/ 1HR
Glucose, 1 hour: 209 mg/dL — ABNORMAL HIGH (ref 70–179)
Glucose, 2 hour: 131 mg/dL (ref 70–152)
Glucose, Fasting: 98 mg/dL — ABNORMAL HIGH (ref 70–91)

## 2022-12-21 LAB — RPR: RPR Ser Ql: NONREACTIVE

## 2022-12-21 LAB — HIV ANTIBODY (ROUTINE TESTING W REFLEX): HIV Screen 4th Generation wRfx: NONREACTIVE

## 2022-12-21 NOTE — Progress Notes (Signed)
erroneous

## 2023-01-03 ENCOUNTER — Ambulatory Visit (INDEPENDENT_AMBULATORY_CARE_PROVIDER_SITE_OTHER): Payer: BLUE CROSS/BLUE SHIELD | Admitting: Obstetrics and Gynecology

## 2023-01-03 VITALS — BP 122/82 | HR 93 | Wt 175.8 lb

## 2023-01-03 DIAGNOSIS — Z98891 History of uterine scar from previous surgery: Secondary | ICD-10-CM

## 2023-01-03 DIAGNOSIS — Z3A28 28 weeks gestation of pregnancy: Secondary | ICD-10-CM

## 2023-01-03 DIAGNOSIS — O24419 Gestational diabetes mellitus in pregnancy, unspecified control: Secondary | ICD-10-CM

## 2023-01-03 DIAGNOSIS — O09299 Supervision of pregnancy with other poor reproductive or obstetric history, unspecified trimester: Secondary | ICD-10-CM

## 2023-01-03 NOTE — Progress Notes (Signed)
ROB: denies any concerns

## 2023-01-03 NOTE — Progress Notes (Signed)
   PRENATAL VISIT NOTE  Subjective:  Michelle Schultz is a 32 y.o. G3P2002 at [redacted]w[redacted]d being seen today for ongoing prenatal care.  She is currently monitored for the following issues for this high-risk pregnancy and has Rubella non-immune status, antepartum; Supervision of high-risk pregnancy; History of HELLP, currently pregnant; History of postpartum hemorrhage; GDM (gestational diabetes mellitus); Transient hypertension of pregnancy in second trimester; History of gestational hypertension; History of cesarean section; and Choroid plexus cysts, fetal, affecting care of mother, antepartum on their problem list.  Patient reports no complaints.  Contractions: Irregular. Vag. Bleeding: None.  Movement: Present. Denies leaking of fluid.   The following portions of the patient's history were reviewed and updated as appropriate: allergies, current medications, past family history, past medical history, past social history, past surgical history and problem list.   Objective:   Vitals:   01/03/23 1119  BP: 122/82  Pulse: 93  Weight: 175 lb 12.8 oz (79.7 kg)    Fetal Status: Fetal Heart Rate (bpm): 147   Movement: Present     General:  Alert, oriented and cooperative. Patient is in no acute distress.  Skin: Skin is warm and dry. No rash noted.   Cardiovascular: Normal heart rate noted  Respiratory: Normal respiratory effort, no problems with respiration noted  Abdomen: Soft, gravid, appropriate for gestational age.  Pain/Pressure: Absent     Pelvic: Cervical exam deferred        Extremities: Normal range of motion.  Edema: None  Mental Status: Normal mood and affect. Normal behavior. Normal judgment and thought content.   Assessment and Plan:  Pregnancy: G3P2002 at [redacted]w[redacted]d 1. Gestational diabetes mellitus (GDM) in third trimester, gestational diabetes method of control unspecified Patient had GDMA1 with last pregnancy so declined classes this pregnancy. AM fastings in the high 90s but post  pranidals all wnl; she doesn't wake up to snack. F/u next visit but did d/w her that may need to start something PO. Growth u/s ordered - US MFM OB DETAIL +14 WK; Future  2. [redacted] weeks gestation of pregnancy Desires BTL, pt has Nurse, learning disability - [place] MFM OB DETAIL +14 WK; Future   3. Supervision of high risk pregnancy in third trimester   4. History of cesarean Desires repeat and BTL. D/w her re: delivery timing with GDM. Can schedule next visit once we have a better idea on timing   5. History of HELLP, currently pregnant Continue you low dose asa  Preterm labor symptoms and general obstetric precautions including but not limited to vaginal bleeding, contractions, leaking of fluid and fetal movement were reviewed in detail with the patient. Please refer to After Visit Summary for other counseling recommendations.   No follow-ups on file.  Future Appointments  Date Time Provider Department Center  01/17/2023  8:55 AM [place] Hock, MD [facility]  01/31/2023  8:35 AM [place] Bing, MD [facility]  02/14/2023  8:55 AM [place] Bing, MD [facility]  02/28/2023  8:35 AM Anyanwu, Jethro Bastos, MD [facility]    [place] Bing, MD

## 2023-01-10 ENCOUNTER — Encounter: Payer: Self-pay | Admitting: Obstetrics and Gynecology

## 2023-01-13 ENCOUNTER — Telehealth: Payer: Self-pay

## 2023-01-17 ENCOUNTER — Ambulatory Visit (INDEPENDENT_AMBULATORY_CARE_PROVIDER_SITE_OTHER): Payer: BLUE CROSS/BLUE SHIELD | Admitting: Obstetrics and Gynecology

## 2023-01-17 ENCOUNTER — Encounter: Payer: Self-pay | Admitting: Obstetrics and Gynecology

## 2023-01-17 VITALS — BP 118/79 | HR 93 | Wt 175.8 lb

## 2023-01-17 DIAGNOSIS — Z2839 Other underimmunization status: Secondary | ICD-10-CM

## 2023-01-17 DIAGNOSIS — O09899 Supervision of other high risk pregnancies, unspecified trimester: Secondary | ICD-10-CM

## 2023-01-17 DIAGNOSIS — Z3A30 30 weeks gestation of pregnancy: Secondary | ICD-10-CM

## 2023-01-17 DIAGNOSIS — O0993 Supervision of high risk pregnancy, unspecified, third trimester: Secondary | ICD-10-CM

## 2023-01-17 DIAGNOSIS — O24410 Gestational diabetes mellitus in pregnancy, diet controlled: Secondary | ICD-10-CM

## 2023-01-17 DIAGNOSIS — Z8759 Personal history of other complications of pregnancy, childbirth and the puerperium: Secondary | ICD-10-CM

## 2023-01-17 DIAGNOSIS — O09299 Supervision of pregnancy with other poor reproductive or obstetric history, unspecified trimester: Secondary | ICD-10-CM

## 2023-01-17 DIAGNOSIS — O3503X0 Maternal care for (suspected) central nervous system malformation or damage in fetus, choroid plexus cysts, not applicable or unspecified: Secondary | ICD-10-CM

## 2023-01-17 DIAGNOSIS — O132 Gestational [pregnancy-induced] hypertension without significant proteinuria, second trimester: Secondary | ICD-10-CM

## 2023-01-17 DIAGNOSIS — Z98891 History of uterine scar from previous surgery: Secondary | ICD-10-CM

## 2023-01-17 MED ORDER — "INSULIN SYRINGE 31G X 5/16"" 1 ML MISC": 10.0000 [IU] | Freq: Every day | 1 refills | Status: DC

## 2023-01-17 MED ORDER — ACCU-CHEK GUIDE VI STRP
ORAL_STRIP | 12 refills | Status: DC
Start: 2023-01-17 — End: 2023-03-07

## 2023-01-17 MED ORDER — INSULIN NPH (HUMAN) (ISOPHANE) 100 UNIT/ML ~~LOC~~ SUSP
10.0000 [IU] | Freq: Every day | SUBCUTANEOUS | 3 refills | Status: DC
Start: 2023-01-17 — End: 2023-01-18

## 2023-01-17 NOTE — Progress Notes (Signed)
   PRENATAL VISIT NOTE  Subjective:  Michelle Schultz is a 32 y.o. G3P2002 at [redacted]w[redacted]d being seen today for ongoing prenatal care.  She is currently monitored for the following issues for this high-risk pregnancy and has Rubella non-immune status, antepartum; Supervision of high-risk pregnancy; History of HELLP, currently pregnant; History of postpartum hemorrhage; GDM (gestational diabetes mellitus); Transient hypertension of pregnancy in second trimester; History of cesarean section; and Choroid plexus cysts, fetal, affecting care of mother, antepartum on their problem list.  Patient reports no complaints.  Contractions: Irregular. Vag. Bleeding: None.  Movement: Present. Denies leaking of fluid.   The following portions of the patient's history were reviewed and updated as appropriate: allergies, current medications, past family history, past medical history, past social history, past surgical history and problem list.   Objective:   Vitals:   01/17/23 0900  BP: 118/79  Pulse: 93  Weight: 175 lb 12.8 oz (79.7 kg)    Fetal Status: Fetal Heart Rate (bpm): 150   Movement: Present     General:  Alert, oriented and cooperative. Patient is in no acute distress.  Skin: Skin is warm and dry. No rash noted.   Cardiovascular: Normal heart rate noted  Respiratory: Normal respiratory effort, no problems with respiration noted  Abdomen: Soft, gravid, appropriate for gestational age.  Pain/Pressure: Absent     Pelvic: Cervical exam deferred        Extremities: Normal range of motion.  Edema: None  Mental Status: Normal mood and affect. Normal behavior. Normal judgment and thought content.   Assessment and Plan:  Pregnancy: G3P2002 at [redacted]w[redacted]d 1. Transient hypertension of pregnancy in second trimester BP wnl today.   2. Diet controlled gestational diabetes mellitus (GDM) in third trimester Growth scheduled for 9/23.  PP remain normal. Fastings are ranging in mid to high 90s for most but actual range  is 92-108.  We discussed MTF vs Insulin and pros/cons. She would like to start insulin. Will start with NPH 10 units at bedtime.   3. Choroid plexus cyst of fetus affecting care of mother, antepartum, single or unspecified fetus Has f/u US on 9/23.   4. History of cesarean section Desires RLTCS and BTL. Schedule request sent for 39 weeks but she and I reviewed it may be sooner pending MFM recommendations.   5. History of HELLP, currently pregnant Continue ldASA  6. History of postpartum hemorrhage In setting of hellp. Given 3 u PRBC  7. Supervision of high risk pregnancy in third trimester She will consider tdap. She declines flu shot.   8. Pregnancy with 30 completed weeks gestation   Preterm labor symptoms and general obstetric precautions including but not limited to vaginal bleeding, contractions, leaking of fluid and fetal movement were reviewed in detail with the patient. Please refer to After Visit Summary for other counseling recommendations.   Return in about 2 weeks (around 01/31/2023) for OB VISIT, MD or APP.  Future Appointments  Date Time Provider Department Center  01/23/2023  8:15 AM [facility] NURSE [facility]  01/23/2023  8:30 AM [facility]  01/31/2023  8:35 AM [place] Bing, MD [facility]  02/14/2023  8:55 AM [place] Bing, MD [facility]  02/28/2023  8:35 AM Anyanwu, Jethro Bastos, MD [facility]    [place] Hock, MD

## 2023-01-17 NOTE — Addendum Note (Signed)
Addended by: Kennon Portela on: 01/17/2023 09:37 AM   Modules accepted: Orders

## 2023-01-18 ENCOUNTER — Encounter: Payer: Self-pay | Admitting: Obstetrics and Gynecology

## 2023-01-18 ENCOUNTER — Other Ambulatory Visit: Payer: Self-pay | Admitting: Obstetrics and Gynecology

## 2023-01-18 DIAGNOSIS — O99210 Obesity complicating pregnancy, unspecified trimester: Secondary | ICD-10-CM | POA: Insufficient documentation

## 2023-01-18 DIAGNOSIS — O24410 Gestational diabetes mellitus in pregnancy, diet controlled: Secondary | ICD-10-CM

## 2023-01-18 MED ORDER — INSULIN SYRINGE 31G X 5/16" 1 ML MISC
10.0000 [IU] | Freq: Every day | 1 refills | Status: DC
Start: 2023-01-18 — End: 2023-03-07

## 2023-01-18 MED ORDER — INSULIN NPH (HUMAN) (ISOPHANE) 100 UNIT/ML ~~LOC~~ SUSP
10.0000 [IU] | Freq: Every day | SUBCUTANEOUS | 3 refills | Status: DC
Start: 2023-01-18 — End: 2023-03-07

## 2023-01-18 NOTE — Addendum Note (Signed)
Addended by: Jyl Heinz on: 01/18/2023 03:32 PM   Modules accepted: Orders

## 2023-01-20 ENCOUNTER — Telehealth: Payer: Self-pay | Admitting: *Deleted

## 2023-01-20 NOTE — Telephone Encounter (Signed)
Novolin N not covered by insurance, okay to switch to Humulin N per Dr Alvester Morin. Spoke to Joe at [facility] to switch.

## 2023-01-23 ENCOUNTER — Other Ambulatory Visit: Payer: Self-pay | Admitting: Obstetrics and Gynecology

## 2023-01-23 ENCOUNTER — Other Ambulatory Visit: Payer: Self-pay | Admitting: *Deleted

## 2023-01-23 ENCOUNTER — Ambulatory Visit: Payer: BLUE CROSS/BLUE SHIELD | Admitting: *Deleted

## 2023-01-23 ENCOUNTER — Ambulatory Visit: Payer: BLUE CROSS/BLUE SHIELD | Attending: Obstetrics and Gynecology

## 2023-01-23 VITALS — BP 118/74 | HR 92

## 2023-01-23 DIAGNOSIS — O99210 Obesity complicating pregnancy, unspecified trimester: Secondary | ICD-10-CM | POA: Insufficient documentation

## 2023-01-23 DIAGNOSIS — Z3A28 28 weeks gestation of pregnancy: Secondary | ICD-10-CM | POA: Diagnosis present

## 2023-01-23 DIAGNOSIS — O24419 Gestational diabetes mellitus in pregnancy, unspecified control: Secondary | ICD-10-CM | POA: Diagnosis present

## 2023-01-23 DIAGNOSIS — O0993 Supervision of high risk pregnancy, unspecified, third trimester: Secondary | ICD-10-CM

## 2023-01-24 ENCOUNTER — Other Ambulatory Visit: Payer: BLUE CROSS/BLUE SHIELD

## 2023-01-24 ENCOUNTER — Ambulatory Visit: Payer: BLUE CROSS/BLUE SHIELD

## 2023-01-31 ENCOUNTER — Ambulatory Visit (INDEPENDENT_AMBULATORY_CARE_PROVIDER_SITE_OTHER): Payer: BLUE CROSS/BLUE SHIELD | Admitting: Obstetrics and Gynecology

## 2023-01-31 VITALS — BP 114/77 | HR 86 | Wt 177.8 lb

## 2023-01-31 DIAGNOSIS — Z3A32 32 weeks gestation of pregnancy: Secondary | ICD-10-CM

## 2023-01-31 DIAGNOSIS — O0993 Supervision of high risk pregnancy, unspecified, third trimester: Secondary | ICD-10-CM

## 2023-01-31 DIAGNOSIS — O24414 Gestational diabetes mellitus in pregnancy, insulin controlled: Secondary | ICD-10-CM

## 2023-01-31 DIAGNOSIS — Z8759 Personal history of other complications of pregnancy, childbirth and the puerperium: Secondary | ICD-10-CM

## 2023-01-31 DIAGNOSIS — O99210 Obesity complicating pregnancy, unspecified trimester: Secondary | ICD-10-CM

## 2023-01-31 DIAGNOSIS — Z98891 History of uterine scar from previous surgery: Secondary | ICD-10-CM

## 2023-01-31 DIAGNOSIS — O09299 Supervision of pregnancy with other poor reproductive or obstetric history, unspecified trimester: Secondary | ICD-10-CM

## 2023-01-31 DIAGNOSIS — O3503X0 Maternal care for (suspected) central nervous system malformation or damage in fetus, choroid plexus cysts, not applicable or unspecified: Secondary | ICD-10-CM

## 2023-01-31 NOTE — Progress Notes (Signed)
ROB: Denies any concerns  Brought sugar log today also

## 2023-01-31 NOTE — Progress Notes (Addendum)
PRENATAL VISIT NOTE  Subjective:  Michelle Schultz is a 32 y.o. G3P2002 at [redacted]w[redacted]d being seen today for ongoing prenatal care.  She is currently monitored for the following issues for this high-risk pregnancy and has Supervision of high-risk pregnancy; History of HELLP, currently pregnant; History of postpartum hemorrhage; GDM (gestational diabetes mellitus); Transient hypertension of pregnancy in second trimester; History of cesarean section; Choroid plexus cysts, fetal, affecting care of mother, antepartum; and Obesity affecting pregnancy, antepartum on their problem list.  Patient reports no complaints.  Contractions: Not present. Vag. Bleeding: None.  Movement: Present. Denies leaking of fluid.   The following portions of the patient's history were reviewed and updated as appropriate: allergies, current medications, past family history, past medical history, past social history, past surgical history and problem list.   Objective:   Vitals:   01/31/23 0838  BP: 114/77  Pulse: 86  Weight: 177 lb 12.8 oz (80.6 kg)    Fetal Status: Fetal Heart Rate (bpm): 136   Movement: Present     General:  Alert, oriented and cooperative. Patient is in no acute distress.  Skin: Skin is warm and dry. No rash noted.   Cardiovascular: Normal heart rate noted  Respiratory: Normal respiratory effort, no problems with respiration noted  Abdomen: Soft, gravid, appropriate for gestational age.  Pain/Pressure: Absent     Pelvic: Cervical exam deferred        Extremities: Normal range of motion.  Edema: None  Mental Status: Normal mood and affect. Normal behavior. Normal judgment and thought content.   Assessment and Plan:  Pregnancy: G3P2002 at [redacted]w[redacted]d 1. [redacted] weeks gestation of pregnancy For BTL with repeat c-section 39/0 on 11/15  2. Insulin controlled gestational diabetes mellitus (GDM) in third trimester Doing well on nph 10 at bedtime with normal CBG log book. Normal u/s last week. Plan for qwk testing  and 39wk repeat c-section 9/23: 29%, 1707gm, ac 56%, afi 16  3. History of cesarean section See above  4. History of HELLP, currently pregnant No issues. ?h/o CHTN. Doing well just on low dose ASA.   5. Choroid plexus cyst of fetus affecting care of mother, antepartum, single or unspecified fetus S/p neg mat21  6. Obesity affecting pregnancy, antepartum, unspecified obesity type 8lbs total weight gain. 25lbs goal by edc d/w her  7. History of postpartum hemorrhage Consider TXA at delivery  8. Supervision of high risk pregnancy in third trimester  Preterm labor symptoms and general obstetric precautions including but not limited to vaginal bleeding, contractions, leaking of fluid and fetal movement were reviewed in detail with the patient. Please refer to After Visit Summary for other counseling recommendations.   No follow-ups on file.  Future Appointments  Date Time Provider Department Center  02/01/2023 10:45 AM [facility] US2 [facility]  02/07/2023  8:15 AM [facility] NST [facility]  02/14/2023  8:15 AM [facility] NST [facility]  02/14/2023  8:55 AM [place] Bing, MD [facility]  02/20/2023  7:30 AM [facility] US3 [facility]  02/28/2023  8:15 AM [facility] NST [facility]  02/28/2023  8:35 AM Anyanwu, Jethro Bastos, MD [facility]  03/07/2023  8:15 AM [facility] NST [facility]  03/07/2023  8:55 AM Anyanwu, Jethro Bastos, MD [facility]  03/14/2023  8:15 AM [facility] NST [facility]  03/14/2023  8:55 AM [place] Bing, MD [facility]  03/21/2023  8:15 AM [facility] NST [facility]  03/21/2023  8:35 AM Anyanwu, Jethro Bastos, MD [facility]    [place] Bing,  MD

## 2023-02-01 ENCOUNTER — Other Ambulatory Visit: Payer: Self-pay | Admitting: General Practice

## 2023-02-01 ENCOUNTER — Ambulatory Visit: Payer: BLUE CROSS/BLUE SHIELD

## 2023-02-01 DIAGNOSIS — O24414 Gestational diabetes mellitus in pregnancy, insulin controlled: Secondary | ICD-10-CM | POA: Diagnosis not present

## 2023-02-01 DIAGNOSIS — Z3A32 32 weeks gestation of pregnancy: Secondary | ICD-10-CM | POA: Diagnosis not present

## 2023-02-07 ENCOUNTER — Ambulatory Visit: Payer: BLUE CROSS/BLUE SHIELD | Admitting: *Deleted

## 2023-02-07 ENCOUNTER — Other Ambulatory Visit (INDEPENDENT_AMBULATORY_CARE_PROVIDER_SITE_OTHER): Payer: BLUE CROSS/BLUE SHIELD

## 2023-02-07 VITALS — BP 110/77 | HR 84 | Wt 176.0 lb

## 2023-02-07 DIAGNOSIS — O24414 Gestational diabetes mellitus in pregnancy, insulin controlled: Secondary | ICD-10-CM

## 2023-02-07 DIAGNOSIS — Z3A33 33 weeks gestation of pregnancy: Secondary | ICD-10-CM

## 2023-02-07 DIAGNOSIS — O0993 Supervision of high risk pregnancy, unspecified, third trimester: Secondary | ICD-10-CM

## 2023-02-07 NOTE — Progress Notes (Signed)
Patient informed that the ultrasound is considered a limited obstetric ultrasound and is not intended to be a complete ultrasound exam.  Patient also informed that the ultrasound is not being completed with the intent of assessing for fetal or placental anomalies or any pelvic abnormalities. Explained that the purpose of today's ultrasound is to assess for fetal well being.  Patient acknowledges the purpose of the exam and the limitations of the study.      Rynell Ciotti, RN      

## 2023-02-14 ENCOUNTER — Ambulatory Visit (INDEPENDENT_AMBULATORY_CARE_PROVIDER_SITE_OTHER): Payer: BLUE CROSS/BLUE SHIELD | Admitting: Obstetrics and Gynecology

## 2023-02-14 ENCOUNTER — Other Ambulatory Visit (INDEPENDENT_AMBULATORY_CARE_PROVIDER_SITE_OTHER): Payer: BLUE CROSS/BLUE SHIELD

## 2023-02-14 ENCOUNTER — Ambulatory Visit: Payer: BLUE CROSS/BLUE SHIELD | Admitting: *Deleted

## 2023-02-14 VITALS — BP 120/79 | HR 89 | Wt 181.0 lb

## 2023-02-14 DIAGNOSIS — Z98891 History of uterine scar from previous surgery: Secondary | ICD-10-CM

## 2023-02-14 DIAGNOSIS — O0993 Supervision of high risk pregnancy, unspecified, third trimester: Secondary | ICD-10-CM

## 2023-02-14 DIAGNOSIS — O99210 Obesity complicating pregnancy, unspecified trimester: Secondary | ICD-10-CM

## 2023-02-14 DIAGNOSIS — O24414 Gestational diabetes mellitus in pregnancy, insulin controlled: Secondary | ICD-10-CM

## 2023-02-14 DIAGNOSIS — Z3A34 34 weeks gestation of pregnancy: Secondary | ICD-10-CM

## 2023-02-14 DIAGNOSIS — O3503X0 Maternal care for (suspected) central nervous system malformation or damage in fetus, choroid plexus cysts, not applicable or unspecified: Secondary | ICD-10-CM

## 2023-02-14 DIAGNOSIS — O09299 Supervision of pregnancy with other poor reproductive or obstetric history, unspecified trimester: Secondary | ICD-10-CM

## 2023-02-14 DIAGNOSIS — O132 Gestational [pregnancy-induced] hypertension without significant proteinuria, second trimester: Secondary | ICD-10-CM

## 2023-02-14 NOTE — Progress Notes (Signed)
   PRENATAL VISIT NOTE  Subjective:  Michelle Schultz is a 32 y.o. G3P2002 at [redacted]w[redacted]d being seen today for ongoing prenatal care.  She is currently monitored for the following issues for this high-risk pregnancy and has Supervision of high-risk pregnancy; History of HELLP, currently pregnant; History of postpartum hemorrhage; GDM (gestational diabetes mellitus); Transient hypertension of pregnancy in second trimester; History of cesarean section; Choroid plexus cysts, fetal, affecting care of mother, antepartum; and Obesity affecting pregnancy, antepartum on their problem list.  Patient reports no complaints.  Contractions: Not present. Vag. Bleeding: None.  Movement: Present. Denies leaking of fluid.   The following portions of the patient's history were reviewed and updated as appropriate: allergies, current medications, past family history, past medical history, past social history, past surgical history and problem list.   Objective:   Vitals:   02/14/23 0900  BP: 120/79  Pulse: 89  Weight: 181 lb (82.1 kg)    Fetal Status: Fetal Heart Rate (bpm): NST   Movement: Present     General:  Alert, oriented and cooperative. Patient is in no acute distress.  Skin: Skin is warm and dry. No rash noted.   Cardiovascular: Normal heart rate noted  Respiratory: Normal respiratory effort, no problems with respiration noted  Abdomen: Soft, gravid, appropriate for gestational age.  Pain/Pressure: Present     Pelvic: Cervical exam deferred        Extremities: Normal range of motion.     Mental Status: Normal mood and affect. Normal behavior. Normal judgment and thought content.   Assessment and Plan:  Pregnancy: G3P2002 at [redacted]w[redacted]d 1. [redacted] weeks gestation of pregnancy Consider GBS next visit For BTL with scheduled repeat c/s  2. Insulin controlled gestational diabetes mellitus (GDM) in third trimester Normal CBG log on NPH 10u at bedtime. Bpp 10/10 today. F/u growth u/s next week  3. History of  cesarean section See above, at 11/15  4. History of HELLP, currently pregnant No issues. Continue asa  5. Obesity affecting pregnancy, antepartum, unspecified obesity type No issues  6. Transient hypertension of pregnancy in second trimester ?CHTN. Normal BP today on no meds  7. Supervision of high risk pregnancy in third trimester   Preterm labor symptoms and general obstetric precautions including but not limited to vaginal bleeding, contractions, leaking of fluid and fetal movement were reviewed in detail with the patient. Please refer to After Visit Summary for other counseling recommendations.   No follow-ups on file.  Future Appointments  Date Time Provider Department Center  02/20/2023  7:30 AM [facility] US3 [facility]  02/28/2023  8:15 AM [facility] NST [facility]  02/28/2023  8:35 AM Anyanwu, Jethro Bastos, MD [facility]  03/07/2023  8:15 AM [facility] NST [facility]  03/07/2023  8:55 AM Anyanwu, Jethro Bastos, MD [facility]  03/14/2023  8:15 AM [facility] NST [facility]  03/14/2023  8:55 AM [place] Bing, MD [facility]  03/21/2023  8:15 AM [facility] NST [facility]  03/21/2023  8:35 AM Anyanwu, Jethro Bastos, MD [facility]    [place] Bing, MD

## 2023-02-14 NOTE — Progress Notes (Signed)

## 2023-02-20 ENCOUNTER — Other Ambulatory Visit: Payer: Self-pay

## 2023-02-20 ENCOUNTER — Ambulatory Visit: Payer: BLUE CROSS/BLUE SHIELD | Attending: Obstetrics

## 2023-02-20 DIAGNOSIS — O09293 Supervision of pregnancy with other poor reproductive or obstetric history, third trimester: Secondary | ICD-10-CM

## 2023-02-20 DIAGNOSIS — E669 Obesity, unspecified: Secondary | ICD-10-CM

## 2023-02-20 DIAGNOSIS — O34219 Maternal care for unspecified type scar from previous cesarean delivery: Secondary | ICD-10-CM | POA: Diagnosis not present

## 2023-02-20 DIAGNOSIS — O24414 Gestational diabetes mellitus in pregnancy, insulin controlled: Secondary | ICD-10-CM | POA: Diagnosis not present

## 2023-02-20 DIAGNOSIS — O24419 Gestational diabetes mellitus in pregnancy, unspecified control: Secondary | ICD-10-CM | POA: Insufficient documentation

## 2023-02-20 DIAGNOSIS — O99213 Obesity complicating pregnancy, third trimester: Secondary | ICD-10-CM

## 2023-02-20 DIAGNOSIS — Z3A35 35 weeks gestation of pregnancy: Secondary | ICD-10-CM

## 2023-02-21 ENCOUNTER — Other Ambulatory Visit: Payer: BLUE CROSS/BLUE SHIELD

## 2023-02-28 ENCOUNTER — Ambulatory Visit: Payer: BLUE CROSS/BLUE SHIELD | Admitting: *Deleted

## 2023-02-28 ENCOUNTER — Other Ambulatory Visit (HOSPITAL_COMMUNITY)
Admission: RE | Admit: 2023-02-28 | Discharge: 2023-02-28 | Disposition: A | Discharge status: Home / Self Care | Payer: BLUE CROSS/BLUE SHIELD | Source: Ambulatory Visit | Attending: Obstetrics & Gynecology | Admitting: Obstetrics & Gynecology

## 2023-02-28 ENCOUNTER — Other Ambulatory Visit (INDEPENDENT_AMBULATORY_CARE_PROVIDER_SITE_OTHER): Payer: BLUE CROSS/BLUE SHIELD

## 2023-02-28 ENCOUNTER — Ambulatory Visit (INDEPENDENT_AMBULATORY_CARE_PROVIDER_SITE_OTHER): Payer: BLUE CROSS/BLUE SHIELD | Admitting: Obstetrics & Gynecology

## 2023-02-28 VITALS — BP 115/78 | HR 85 | Wt 182.0 lb

## 2023-02-28 DIAGNOSIS — O0993 Supervision of high risk pregnancy, unspecified, third trimester: Secondary | ICD-10-CM

## 2023-02-28 DIAGNOSIS — Z3A36 36 weeks gestation of pregnancy: Secondary | ICD-10-CM | POA: Diagnosis not present

## 2023-02-28 DIAGNOSIS — O09299 Supervision of pregnancy with other poor reproductive or obstetric history, unspecified trimester: Secondary | ICD-10-CM

## 2023-02-28 DIAGNOSIS — O24414 Gestational diabetes mellitus in pregnancy, insulin controlled: Secondary | ICD-10-CM | POA: Diagnosis not present

## 2023-02-28 DIAGNOSIS — Z98891 History of uterine scar from previous surgery: Secondary | ICD-10-CM

## 2023-02-28 NOTE — Progress Notes (Signed)
PRENATAL VISIT NOTE  Subjective:  Michelle Schultz is a 32 y.o. G3P2002 at [redacted]w[redacted]d being seen today for ongoing prenatal care.  She is currently monitored for the following issues for this high-risk pregnancy and has Supervision of high-risk pregnancy; History of HELLP, currently pregnant; History of postpartum hemorrhage; GDM (gestational diabetes mellitus); Transient hypertension of pregnancy in second trimester; History of cesarean section; Choroid plexus cysts, fetal, affecting care of mother, antepartum; and Obesity affecting pregnancy, antepartum on their problem list.  Patient reports no complaints.  Contractions: Irregular. Vag. Bleeding: None.  Movement: Present. Denies leaking of fluid.   The following portions of the patient's history were reviewed and updated as appropriate: allergies, current medications, past family history, past medical history, past social history, past surgical history and problem list.   Objective:   Vitals:   02/28/23 0900  BP: 115/78  Pulse: 85  Weight: 182 lb (82.6 kg)    Fetal Status: Fetal Heart Rate (bpm): NST   Movement: Present     General:  Alert, oriented and cooperative. Patient is in no acute distress.  Skin: Skin is warm and dry. No rash noted.   Cardiovascular: Normal heart rate noted  Respiratory: Normal respiratory effort, no problems with respiration noted  Abdomen: Soft, gravid, appropriate for gestational age.  Pain/Pressure: Present     Pelvic: Cervical exam deferred but pelvic cultures obtained in presence of chaperone        Extremities: Normal range of motion.  Edema: Trace  Mental Status: Normal mood and affect. Normal behavior. Normal judgment and thought content.   [place] MFM FETAL BPP WO NON STRESS  Result Date: 02/20/2023 ----------------------------------------------------------------------  OBSTETRICS REPORT                       (Signed Final 02/20/2023 09:13 am)  ---------------------------------------------------------------------- Patient Info  ID #:       [id number]                          D.O.B.:  [birth year] (32 yrs)  Name:       Michelle Schultz                   Visit Date: 02/20/2023 07:33 am ---------------------------------------------------------------------- Performed By  Attending:        Ma Rings MD         Ref. Address:     [address]                                                             [address]  Performed By:     Tommie Raymond BS,       [facility]                    RDMS, RVT                                [facility]  Referred By:      [facility] ---------------------------------------------------------------------- Orders  #  Description                           Code        Ordered By  1  [place] MFM FETAL BPP WO NON               76819.01    YU FANG     STRESS  2  [place] MFM OB FOLLOW UP                   [id number]    YU FANG ----------------------------------------------------------------------  #  Order #                     Accession #                Episode #  1  [id number]                   [id number]                 [id number]  2  [id number]                   [id number]                 [id number] ---------------------------------------------------------------------- Indications  Gestational diabetes in pregnancy, insulin     O24.414  controlled  Obesity complicating pregnancy, third          O99.213  trimester (PG BMI 32)  Poor obstetric history: Prior fetal            O09.299  macrosomia, antepartum ( 5188C)  Poor obstetric history: Previous               O09.299  preeclampsia (HELLP)  [redacted] weeks gestation of pregnancy                Z3A.35  Previous cesarean delivery, antepartum         O34.219  Poor obstetric history: Previous gestational   O09.299  diabetes  Neg MAT 21, neg AFP   Encounter for other antenatal screening        Z36.2  follow-up ---------------------------------------------------------------------- Fetal Evaluation  Num Of Fetuses:         1  Fetal Heart Rate(bpm):  135  Cardiac Activity:       Observed  Presentation:           Cephalic  Placenta:               Posterior Fundal  P. Cord Insertion:      Previously seen  Amniotic Fluid  AFI FV:      Within normal limits  AFI Sum(cm)     %Tile       Largest Pocket(cm)  12.43           39          5.24  RUQ(cm)       RLQ(cm)  LUQ(cm)        LLQ(cm)  0             2.98          4.2            5.24 ---------------------------------------------------------------------- Biophysical Evaluation  Amniotic F.V:   Within normal limits       F. Tone:        Observed  F. Movement:    Observed                   Score:          8/8  F. Breathing:   Observed ---------------------------------------------------------------------- Biometry  BPD:      86.1  mm     G. Age:  34w 5d         34  %    CI:        75.98   %    70 - 86                                                          FL/HC:      21.8   %    20.1 - 22.3  HC:      313.1  mm     G. Age:  35w 1d         13  %    HC/AC:      0.93        0.93 - 1.11  AC:      336.8  mm     G. Age:  37w 4d         97  %    FL/BPD:     79.2   %    71 - 87  FL:       68.2  mm     G. Age:  35w 0d         33  %    FL/AC:      20.2   %    20 - 24  LV:        5.1  mm  Est. FW:    2917  gm      6 lb 7 oz     75  % ---------------------------------------------------------------------- OB History  Blood Type:   A+  Gravidity:    3         Term:   2  Living:       2 ---------------------------------------------------------------------- Gestational Age  LMP:           36w 2d        Date:  06/11/22                  EDD:   03/18/23  Clinical EDD:  47W 3d                                        EDD:   03/24/23  U/S Today:     35w 4d  EDD:   03/23/23  Best:          Consuello Closs 3d      Det. By:  Clinical EDD             EDD:   03/24/23 ---------------------------------------------------------------------- Targeted Anatomy  Central Nervous System  Calvarium/Cranial V.:  Appears normal         Cereb./Vermis:          Previously seen  Cavum:                 Appears normal         Cisterna Magna:         Previously seen  Lateral Ventricles:    Appears normal         Midline Falx:           Appears normal  Choroid Plexus:        Not well visualized  Spine  Cervical:              Ltd previously         Sacral:                 Ltd previously  Thoracic:              Ltd previously         Shape/Curvature:        Previously seen  Lumbar:                Ltd previously  Head/Neck  Lips:                  Previously seen        Profile:                Not well visualized  Neck:                  Previously seen        Orbits/Eyes:            Previously seen  Nuchal Fold:           Not applicable         Mandible:               Not well visualized  Nasal Bone:            Not well visualized    Maxilla:                Not well visualized  Thorax  4 Chamber View:        Appears normal         SVC:                    Previously seen  Cardiac Activity:      Observed               Interventr. Septum:     Previously seen  Cardiac Rhythm:        Normal                 Cardiac Axis:           Normal  Cardiac Situs:         Appears normal         Diaphragm:              Appears normal  Rt Outflow Tract:      Previously seen  3 Vessel View:          Previously seen  Lt Outflow Tract:      Appears normal         3 V Trachea View:       Previously seen  Aortic Arch:           Previously seen        IVC:                    Previously seen  Ductal Arch:           Not well visualized    Crossing:               Previously seen  Abdomen  Ventral Wall:          Appears normal         Lt Kidney:              Appears normal  Cord Insertion:        Limited                Rt Kidney:              Appears normal  Situs:                  Previously seen        Bladder:                Appears normal  Stomach:               Appears normal  Extremities  Lt Humerus:            Not well visualized    Lt Femur:               Previously seen  Rt Humerus:            Previously seen        Rt Femur:               Previously seen  Lt Forearm:            Previously seen        Lt Lower Leg:           Previously seen  Rt Forearm:            Previously seen        Rt Lower Leg:           Previously seen  Lt Hand:               Previously seen        Lt Foot:                Foot vis. previously  Rt Hand:               Hand vis. previously   Rt Foot:                Foot vis. previously  Other  Umbilical Cord:        Previously seen        Genitalia:              Previously seen  Comment:     Adv Technically difficult due to gestational age. ---------------------------------------------------------------------- Cervix Uterus Adnexa  Cervix  Not visualized (advanced GA >24wks)  Uterus  No abnormality visualized.  Right Ovary  Within normal limits.  Left Ovary  Within normal limits.  Cul De Sac  No free fluid seen.  Adnexa  No abnormality visualized ---------------------------------------------------------------------- Comments  This patient was seen for a follow up growth scan due to  gestational diabetes that is treated with insulin.  She denies  any problems since her last exam.  She was informed that the fetal growth and amniotic fluid  level appears appropriate for her gestational age.  A BPP performed today was 8 out of 8.  Due to gestational diabetes treated with insulin, she should  continue weekly fetal testing in your office until delivery.  These tests have already been scheduled.  The patient has a repeat cesarean delivery scheduled on  March 17, 2023.  No further exams were scheduled in our office. ----------------------------------------------------------------------                   Ma Rings, MD Electronically Signed Final Report    02/20/2023 09:13 am ----------------------------------------------------------------------   [place] MFM OB FOLLOW UP  Result Date: 02/20/2023 ----------------------------------------------------------------------  OBSTETRICS REPORT                       (Signed Final 02/20/2023 09:13 am) ---------------------------------------------------------------------- Patient Info  ID #:       [id number]                          D.O.B.:  June 15, 1990 (32 yrs)  Name:       Michelle Schultz                   Visit Date: 02/20/2023 07:33 am ---------------------------------------------------------------------- Performed By  Attending:        Ma Rings MD         Ref. Address:     [address]                                                             [address]  Performed By:     Tommie Raymond BS,       [facility]                    RDMS, RVT                                [facility]  Referred By:      [facility] ---------------------------------------------------------------------- Orders  #  Description  Code        Ordered By  1  [place] MFM FETAL BPP WO NON               [id number]    YU FANG     STRESS  2  [place] MFM OB FOLLOW UP                   [id number]    YU FANG ----------------------------------------------------------------------  #  Order #                     Accession #                Episode #  1  [id number]                   [id number]                 [id number]  2  [id number]                   [id number]                 [id number] ---------------------------------------------------------------------- Indications  Gestational diabetes in pregnancy, insulin     O24.414  controlled  Obesity complicating pregnancy, third          O99.213  trimester (PG BMI 32)  Poor obstetric history: Prior fetal            O09.299  macrosomia, antepartum (  5956L)  Poor obstetric history: Previous               O09.299  preeclampsia (HELLP)  [redacted] weeks gestation of pregnancy                Z3A.35  Previous cesarean delivery, antepartum         O34.219  Poor obstetric history: Previous gestational   O09.299  diabetes  Neg MAT 21, neg AFP  Encounter for other antenatal screening        Z36.2  follow-up ---------------------------------------------------------------------- Fetal Evaluation  Num Of Fetuses:         1  Fetal Heart Rate(bpm):  135  Cardiac Activity:       Observed  Presentation:           Cephalic  Placenta:               Posterior Fundal  P. Cord Insertion:      Previously seen  Amniotic Fluid  AFI FV:      Within normal limits  AFI Sum(cm)     %Tile       Largest Pocket(cm)  12.43           39          5.24  RUQ(cm)       RLQ(cm)       LUQ(cm)        LLQ(cm)  0             2.98          4.2            5.24 ---------------------------------------------------------------------- Biophysical Evaluation  Amniotic F.V:   Within normal limits       F. Tone:        Observed  F. Movement:    Observed                   Score:  8/8  F. Breathing:   Observed ---------------------------------------------------------------------- Biometry  BPD:      86.1  mm     G. Age:  34w 5d         34  %    CI:        75.98   %    70 - 86                                                          FL/HC:      21.8   %    20.1 - 22.3  HC:      313.1  mm     G. Age:  35w 1d         13  %    HC/AC:      0.93        0.93 - 1.11  AC:      336.8  mm     G. Age:  37w 4d         97  %    FL/BPD:     79.2   %    71 - 87  FL:       68.2  mm     G. Age:  35w 0d         33  %    FL/AC:      20.2   %    20 - 24  LV:        5.1  mm  Est. FW:    2917  gm      6 lb 7 oz     75  % ---------------------------------------------------------------------- OB History  Blood Type:   A+  Gravidity:    3         Term:   2  Living:       2  ---------------------------------------------------------------------- Gestational Age  LMP:           36w 2d        Date:  06/11/22                  EDD:   03/18/23  Clinical EDD:  96E 3d                                        EDD:   03/24/23  U/S Today:     35w 4d                                        EDD:   03/23/23  Best:          35w 3d     Det. By:  Clinical EDD             EDD:   03/24/23 ---------------------------------------------------------------------- Targeted Anatomy  Central Nervous System  Calvarium/Cranial V.:  Appears normal         Cereb./Vermis:          Previously seen  Cavum:                 Appears normal  Cisterna Magna:         Previously seen  Lateral Ventricles:    Appears normal         Midline Falx:           Appears normal  Choroid Plexus:        Not well visualized  Spine  Cervical:              Ltd previously         Sacral:                 Ltd previously  Thoracic:              Ltd previously         Shape/Curvature:        Previously seen  Lumbar:                Ltd previously  Head/Neck  Lips:                  Previously seen        Profile:                Not well visualized  Neck:                  Previously seen        Orbits/Eyes:            Previously seen  Nuchal Fold:           Not applicable         Mandible:               Not well visualized  Nasal Bone:            Not well visualized    Maxilla:                Not well visualized  Thorax  4 Chamber View:        Appears normal         SVC:                    Previously seen  Cardiac Activity:      Observed               Interventr. Septum:     Previously seen  Cardiac Rhythm:        Normal                 Cardiac Axis:           Normal  Cardiac Situs:         Appears normal         Diaphragm:              Appears normal  Rt Outflow Tract:      Previously seen        3 Vessel View:          Previously seen  Lt Outflow Tract:      Appears normal         3 V Trachea View:       Previously seen  Aortic Arch:            Previously seen        IVC:                    Previously seen  Ductal Arch:  Not well visualized    Crossing:               Previously seen  Abdomen  Ventral Wall:          Appears normal         Lt Kidney:              Appears normal  Cord Insertion:        Limited                Rt Kidney:              Appears normal  Situs:                 Previously seen        Bladder:                Appears normal  Stomach:               Appears normal  Extremities  Lt Humerus:            Not well visualized    Lt Femur:               Previously seen  Rt Humerus:            Previously seen        Rt Femur:               Previously seen  Lt Forearm:            Previously seen        Lt Lower Leg:           Previously seen  Rt Forearm:            Previously seen        Rt Lower Leg:           Previously seen  Lt Hand:               Previously seen        Lt Foot:                Foot vis. previously  Rt Hand:               Hand vis. previously   Rt Foot:                Foot vis. previously  Other  Umbilical Cord:        Previously seen        Genitalia:              Previously seen  Comment:     Adv Technically difficult due to gestational age. ---------------------------------------------------------------------- Cervix Uterus Adnexa  Cervix  Not visualized (advanced GA >24wks)  Uterus  No abnormality visualized.  Right Ovary  Within normal limits.  Left Ovary  Within normal limits.  Cul De Sac  No free fluid seen.  Adnexa  No abnormality visualized ---------------------------------------------------------------------- Comments  This patient was seen for a follow up growth scan due to  gestational diabetes that is treated with insulin.  She denies  any problems since her last exam.  She was informed that the fetal growth and amniotic fluid  level appears appropriate for her gestational age.  A BPP performed today was 8 out of 8.  Due to gestational diabetes treated with insulin, she should  continue weekly fetal  testing in your office until delivery.  These tests have already  been scheduled.  The patient has a repeat cesarean delivery scheduled on  March 17, 2023.  No further exams were scheduled in our office. ----------------------------------------------------------------------                   Ma Rings, MD Electronically Signed Final Report   02/20/2023 09:13 am ----------------------------------------------------------------------   US FETAL BPP W/NONSTRESS  Result Date: 02/15/2023 ----------------------------------------------------------------------  OBSTETRICS REPORT                       (Signed Final 02/15/2023 10:28 am) ---------------------------------------------------------------------- Patient Info  ID #:       [id number]                          D.O.B.:  [birth year] (32 yrs)  Name:       Michelle Schultz                   Visit Date: 02/14/2023 10:58 am ---------------------------------------------------------------------- Performed By  Attending:        Bostwick Bing MD     Ref. Address:     [address]                                                             [address]  Performed By:     Scheryl Marten RN     [facility]         [facility]                                                             [facility]  Referred By:      [facility] Mila Merry ---------------------------------------------------------------------- Orders  #  Description                           Code        Ordered By  1  US FETAL BPP W/NONSTRESS              82956.2     [place] Bing ----------------------------------------------------------------------  #  Order #                     Accession #                Episode #  1  [id number]                   [id number]                 [id number] ---------------------------------------------------------------------- Indications  [redacted] weeks gestation  of pregnancy                Z3A.34  Gestational diabetes in pregnancy, insulin     O24.414  controlled ---------------------------------------------------------------------- Fetal Evaluation  Num Of Fetuses:         1  Cardiac Activity:       Observed  Presentation:           Cephalic  Amniotic Fluid  AFI FV:      Within normal limits  AFI Sum(cm)     %Tile       Largest Pocket(cm)  11.76           33          3.76  RUQ(cm)       RLQ(cm)       LUQ(cm)        LLQ(cm)  3.63          1.22          3.76           3.15 ---------------------------------------------------------------------- Biophysical Evaluation  Amniotic F.V:   Within normal limits       F. Tone:        Observed  F. Movement:    Observed                   N.S.T:          Reactive  F. Breathing:   Observed                   Score:          10/10 ---------------------------------------------------------------------- OB History  Blood Type:   A+  Gravidity:    3         Term:   2  Living:       2 ---------------------------------------------------------------------- Gestational Age  LMP:           35w 3d        Date:  06/11/22                  EDD:   03/18/23  Clinical EDD:  34w 4d                                        EDD:   03/24/23  Best:          34w 4d     Det. By:  Clinical EDD             EDD:   03/24/23 ---------------------------------------------------------------------- Impression  Reassuring antenatal testing ---------------------------------------------------------------------- Recommendations  Continue weekly testing. ----------------------------------------------------------------------                 Harris Bing, MD Electronically Signed Final Report   02/15/2023 10:28 am ----------------------------------------------------------------------   US FETAL BPP W/NONSTRESS  Result Date: 02/13/2023 ----------------------------------------------------------------------  OBSTETRICS REPORT                       (Signed Final 02/13/2023  08:06 am) ---------------------------------------------------------------------- Patient Info  ID #:       [id number]  D.O.B.:  [birth year] (32 yrs)  Name:       Michelle Schultz                   Visit Date: 02/07/2023 09:10 am ---------------------------------------------------------------------- Performed By  Attending:        Jaynie Collins         Ref. Address:     [address]                    [address]                                                             [address]  Performed By:     Scheryl Marten RN     Location:         [facility]                                                             [facility]  Referred By:      [facility] Mila Merry ---------------------------------------------------------------------- Orders  #  Description                           Code        Ordered By  1  US FETAL BPP W/NONSTRESS              95621.3     Jaynie Collins ----------------------------------------------------------------------  #  Order #                     Accession #                Episode #  1  [id number]                   [id number]                 [id number] ---------------------------------------------------------------------- Indications  [redacted] weeks gestation of pregnancy                Z3A.33  Gestational diabetes in pregnancy, insulin     O24.414  controlled ---------------------------------------------------------------------- Fetal Evaluation  Num Of Fetuses:         1  Cardiac Activity:       Observed  Presentation:           Cephalic  Amniotic Fluid  AFI FV:      Within normal limits  AFI Sum(cm)     %Tile       Largest Pocket(cm)  14.35           50          5.4  RUQ(cm)       RLQ(cm)       LUQ(cm)        LLQ(cm)  2.77          5.4           1.98           4.2 ---------------------------------------------------------------------- Biophysical Evaluation   Amniotic F.V:   Within normal limits       F. Tone:        Observed  F. Movement:    Observed                   N.S.T:          Reactive  F. Breathing:   Observed                   Score:          10/10 ---------------------------------------------------------------------- OB History  Blood Type:   A+  Gravidity:    3         Term:   2  Living:       2 ---------------------------------------------------------------------- Gestational Age  LMP:           34w 3d        Date:  06/11/22                  EDD:   03/18/23  Clinical EDD:  33w 4d                                        EDD:   03/24/23  Best:          33w 4d     Det. By:  Clinical EDD             EDD:   03/24/23 ---------------------------------------------------------------------- Impression  Antenatal testing is reassuring. BPP 10/10.  Normal amniotic  fluid volume. ---------------------------------------------------------------------- Recommendations  Continue weekly antenatal testing till delivery . ----------------------------------------------------------------------               Jaynie Collins, MD Electronically Signed Final Report   02/13/2023 08:06 am ----------------------------------------------------------------------   US Fetal BPP W/O Non Stress  Result Date: 02/06/2023 ----------------------------------------------------------------------  OBSTETRICS REPORT                        (Signed Final 02/06/2023 10:27 pm) ---------------------------------------------------------------------- Patient Info  ID #:       [id number]                          D.O.B.:  03-Jan-1991 (32 yrs)  Name:       Michelle Schultz                   Visit Date: 02/01/2023 11:07 am ---------------------------------------------------------------------- Performed By  Attending:        Mariel Aloe MD       Ref. Address:      [address]  Performed By:     Otilio Jefferson BA       [facility]  Referred By:      [facility]       Visit Type:        OB ---------------------------------------------------------------------- Orders  #  Description                           Code        Ordered By  1  US FETAL BPP WO NON STRESS            76819.0     CHARLIE PICKENS ----------------------------------------------------------------------  #  Order #                     Accession #                Episode #  1  [id number]                   [id number]                 [id number] ---------------------------------------------------------------------- Indications  [redacted] weeks gestation of pregnancy                 Z3A.32  Gestational diabetes in pregnancy, diet         O24.410  controlled ---------------------------------------------------------------------- Fetal Evaluation  Num Of Fetuses:          1  Fetal Heart Rate(bpm):   178  Cardiac Activity:        Observed  Presentation:            Cephalic  Placenta:                Posterior  Amniotic Fluid  AFI FV:      Within normal limits  AFI Sum(cm)     %Tile       Largest Pocket(cm)  16.45           59          5.54  RUQ(cm)       RLQ(cm)       LUQ(cm)        LLQ(cm)  5.54          2.64  4.86           3.41  Comment:    FHT 178, BPP 8/8 ---------------------------------------------------------------------- Biophysical Evaluation  Amniotic F.V:   Within normal limits       F. Tone:         Observed  F. Movement:    Observed                   Score:           8/8  F. Breathing:   Observed ---------------------------------------------------------------------- OB History  Blood Type:   A+  Gravidity:    3         Term:   2  Living:       2 ---------------------------------------------------------------------- Gestational Age  LMP:           33w  4d        Date:  06/11/22                  EDD:   03/18/23  Clinical EDD:  32w 5d                                        EDD:   03/24/23  Best:          32w 5d     Det. By:  Clinical EDD             EDD:   03/24/23 ----------------------------------------------------------------------                 Mariel Aloe, MD Electronically Signed Final Report   02/06/2023 10:27 pm ----------------------------------------------------------------------    Assessment and Plan:  Pregnancy: [id number] at [redacted]w[redacted]d 1. Insulin controlled gestational diabetes mellitus (GDM) in third trimester Reviewed CBGs, under good control.  Continue NPH 10 u at bedtime. NST performed today was reviewed and was found to be reactive. Subsequent BPP performed today was also reviewed and was found to be 10/10. AFI was also normal. Continue recommended antenatal testing and prenatal care.  2. History of cesarean section Already scheduled for RCS and BTS on 03/17/23.  3. History of HELLP, currently pregnant Normal BP so far.   4. [redacted] weeks gestation of pregnancy 5. Supervision of high risk pregnancy in third trimester Pelvic cultures done today, will follow up results and manage accordingly. - Cervicovaginal ancillary only - Strep Gp B NAA Preterm labor symptoms and general obstetric precautions including but not limited to vaginal bleeding, contractions, leaking of fluid and fetal movement were reviewed in detail with the patient. Please refer to After Visit Summary for other counseling recommendations.   Return in about 1 week (around 03/07/2023) for NST, BPP, OFFICE OB VISIT (MD only).  Future Appointments  Date Time Provider Department Center  03/07/2023  8:15 AM [facility] NST [facility]  03/07/2023  8:55 AM Maryanna Stuber, Jethro Bastos, MD [facility]  03/15/2023  8:15 AM [facility] NST [facility]  03/15/2023  9:15 AM Reva Bores, MD [facility]  03/21/2023  8:15 AM [facility] NST [facility]  [facility]  03/21/2023  8:35 AM Illene Sweeting, Jethro Bastos, MD [facility]    Jaynie Collins, MD

## 2023-02-28 NOTE — Progress Notes (Signed)
Patient informed that the ultrasound is considered a limited obstetric ultrasound and is not intended to be a complete ultrasound exam.  Patient also informed that the ultrasound is not being completed with the intent of assessing for fetal or placental anomalies or any pelvic abnormalities. Explained that the purpose of today's ultrasound is to assess for fetal well being.  Patient acknowledges the purpose of the exam and the limitations of the study.      Rynell Ciotti, RN      

## 2023-02-28 NOTE — Patient Instructions (Signed)

## 2023-03-01 LAB — CERVICOVAGINAL ANCILLARY ONLY
Chlamydia: NEGATIVE
Comment: NEGATIVE
Comment: NORMAL
Neisseria Gonorrhea: NEGATIVE

## 2023-03-02 LAB — STREP GP B NAA: Strep Gp B NAA: NEGATIVE

## 2023-03-03 ENCOUNTER — Telehealth (HOSPITAL_COMMUNITY): Payer: Self-pay | Admitting: *Deleted

## 2023-03-03 ENCOUNTER — Encounter (HOSPITAL_COMMUNITY): Payer: Self-pay

## 2023-03-03 NOTE — Patient Instructions (Signed)
Eden Toohey  03/03/2023   Your procedure is scheduled on:  03/17/2023  Arrive at 0730 at Entrance C on [address] at [facility]  and [employer]. You are invited to use the FREE valet parking or use the Visitor's parking deck.  Pick up the phone at the desk and dial [phone].  Call this number if you have problems the morning of surgery: [phone]  Remember:   Do not eat food:(After Midnight) Desps de medianoche.  You may drink clear liquids until arrival at __0730___.  Clear liquids means a liquid you can see thru.  It can have color such as Cola or Kool aid.  Tea is OK and coffee as long as no milk or creamer of any kind.  Take these medicines the morning of surgery with A SIP OF WATER:  The night before your surgery take half of the prescribed dose of your insulin.  No medicine on day of surgery   Do not wear jewelry, make-up or nail polish.  Do not wear lotions, powders, or perfumes. Do not wear deodorant.  Do not shave 48 hours prior to surgery.  Do not bring valuables to the hospital.  [facility] is not   responsible for any belongings or valuables brought to the hospital.  Contacts, dentures or bridgework may not be worn into surgery.  Leave suitcase in the car. After surgery it may be brought to your room.  For patients admitted to the hospital, checkout time is 11:00 AM the day of              discharge.      Please read over the following fact sheets that you were given:     Preparing for Surgery

## 2023-03-03 NOTE — Telephone Encounter (Signed)
Preadmission screen  

## 2023-03-05 ENCOUNTER — Encounter (HOSPITAL_COMMUNITY): Payer: Self-pay | Admitting: Anesthesiology

## 2023-03-05 ENCOUNTER — Encounter (HOSPITAL_COMMUNITY)
Admission: AD | Disposition: A | Discharge status: Home / Self Care | Payer: Self-pay | Source: Home / Self Care | Attending: Obstetrics and Gynecology

## 2023-03-05 ENCOUNTER — Inpatient Hospital Stay (HOSPITAL_COMMUNITY): Payer: BLUE CROSS/BLUE SHIELD | Admitting: Anesthesiology

## 2023-03-05 ENCOUNTER — Inpatient Hospital Stay (HOSPITAL_COMMUNITY)
Admission: AD | Admit: 2023-03-05 | Discharge: 2023-03-07 | DRG: 784 | Disposition: A | Discharge status: Home / Self Care | Payer: BLUE CROSS/BLUE SHIELD | Attending: Obstetrics and Gynecology | Admitting: Obstetrics and Gynecology

## 2023-03-05 ENCOUNTER — Encounter (HOSPITAL_COMMUNITY): Payer: Self-pay | Admitting: Obstetrics and Gynecology

## 2023-03-05 ENCOUNTER — Other Ambulatory Visit: Payer: Self-pay

## 2023-03-05 DIAGNOSIS — O139 Gestational [pregnancy-induced] hypertension without significant proteinuria, unspecified trimester: Secondary | ICD-10-CM | POA: Diagnosis present

## 2023-03-05 DIAGNOSIS — O358XX0 Maternal care for other (suspected) fetal abnormality and damage, not applicable or unspecified: Secondary | ICD-10-CM | POA: Diagnosis present

## 2023-03-05 DIAGNOSIS — O24419 Gestational diabetes mellitus in pregnancy, unspecified control: Secondary | ICD-10-CM | POA: Diagnosis present

## 2023-03-05 DIAGNOSIS — O133 Gestational [pregnancy-induced] hypertension without significant proteinuria, third trimester: Principal | ICD-10-CM

## 2023-03-05 DIAGNOSIS — Z8249 Family history of ischemic heart disease and other diseases of the circulatory system: Secondary | ICD-10-CM | POA: Diagnosis not present

## 2023-03-05 DIAGNOSIS — O34211 Maternal care for low transverse scar from previous cesarean delivery: Secondary | ICD-10-CM | POA: Diagnosis not present

## 2023-03-05 DIAGNOSIS — O99214 Obesity complicating childbirth: Secondary | ICD-10-CM | POA: Diagnosis not present

## 2023-03-05 DIAGNOSIS — O99210 Obesity complicating pregnancy, unspecified trimester: Secondary | ICD-10-CM

## 2023-03-05 DIAGNOSIS — O09299 Supervision of pregnancy with other poor reproductive or obstetric history, unspecified trimester: Secondary | ICD-10-CM

## 2023-03-05 DIAGNOSIS — Z8759 Personal history of other complications of pregnancy, childbirth and the puerperium: Secondary | ICD-10-CM

## 2023-03-05 DIAGNOSIS — O99820 Streptococcus B carrier state complicating pregnancy: Secondary | ICD-10-CM | POA: Diagnosis not present

## 2023-03-05 DIAGNOSIS — Z3A37 37 weeks gestation of pregnancy: Secondary | ICD-10-CM

## 2023-03-05 DIAGNOSIS — O9081 Anemia of the puerperium: Secondary | ICD-10-CM | POA: Diagnosis not present

## 2023-03-05 DIAGNOSIS — O99824 Streptococcus B carrier state complicating childbirth: Secondary | ICD-10-CM | POA: Diagnosis present

## 2023-03-05 DIAGNOSIS — Z7982 Long term (current) use of aspirin: Secondary | ICD-10-CM | POA: Diagnosis not present

## 2023-03-05 DIAGNOSIS — Z302 Encounter for sterilization: Secondary | ICD-10-CM | POA: Diagnosis not present

## 2023-03-05 DIAGNOSIS — D62 Acute posthemorrhagic anemia: Secondary | ICD-10-CM | POA: Diagnosis not present

## 2023-03-05 DIAGNOSIS — O24424 Gestational diabetes mellitus in childbirth, insulin controlled: Secondary | ICD-10-CM | POA: Diagnosis not present

## 2023-03-05 DIAGNOSIS — Z98891 History of uterine scar from previous surgery: Secondary | ICD-10-CM

## 2023-03-05 DIAGNOSIS — O0993 Supervision of high risk pregnancy, unspecified, third trimester: Secondary | ICD-10-CM

## 2023-03-05 DIAGNOSIS — O24414 Gestational diabetes mellitus in pregnancy, insulin controlled: Secondary | ICD-10-CM

## 2023-03-05 DIAGNOSIS — O134 Gestational [pregnancy-induced] hypertension without significant proteinuria, complicating childbirth: Secondary | ICD-10-CM | POA: Diagnosis not present

## 2023-03-05 DIAGNOSIS — Z87891 Personal history of nicotine dependence: Secondary | ICD-10-CM

## 2023-03-05 DIAGNOSIS — R030 Elevated blood-pressure reading, without diagnosis of hypertension: Secondary | ICD-10-CM | POA: Diagnosis present

## 2023-03-05 HISTORY — DX: Gestational (pregnancy-induced) hypertension without significant proteinuria, unspecified trimester: O13.9

## 2023-03-05 HISTORY — PX: TUBAL LIGATION: SHX77

## 2023-03-05 LAB — COMPREHENSIVE METABOLIC PANEL
ALT: 16 U/L (ref 0–44)
AST: 21 U/L (ref 15–41)
Albumin: 3 g/dL — ABNORMAL LOW (ref 3.5–5.0)
Alkaline Phosphatase: 149 U/L — ABNORMAL HIGH (ref 38–126)
Anion gap: 12 (ref 5–15)
BUN: 11 mg/dL (ref 6–20)
CO2: 17 mmol/L — ABNORMAL LOW (ref 22–32)
Calcium: 9.1 mg/dL (ref 8.9–10.3)
Chloride: 108 mmol/L (ref 98–111)
Creatinine, Ser: 0.81 mg/dL (ref 0.44–1.00)
GFR, Estimated: >60 mL/min (ref >60)
Glucose, Bld: 92 mg/dL (ref 70–99)
Potassium: 3.8 mmol/L (ref 3.5–5.1)
Sodium: 137 mmol/L (ref 135–145)
Total Bilirubin: 0.4 mg/dL (ref 0.3–1.2)
Total Protein: 6.3 g/dL — ABNORMAL LOW (ref 6.5–8.1)

## 2023-03-05 LAB — URINALYSIS, ROUTINE W REFLEX MICROSCOPIC
Bilirubin Urine: NEGATIVE
Glucose, UA: NEGATIVE mg/dL
Hgb urine dipstick: NEGATIVE
Ketones, ur: 5 mg/dL — AB
Leukocytes,Ua: NEGATIVE
Nitrite: NEGATIVE
Protein, ur: NEGATIVE mg/dL
Specific Gravity, Urine: 1.009 (ref 1.005–1.030)
pH: 5 (ref 5.0–8.0)

## 2023-03-05 LAB — CBC
HCT: 31.8 % — ABNORMAL LOW (ref 36.0–46.0)
Hemoglobin: 10.2 g/dL — ABNORMAL LOW (ref 12.0–15.0)
MCH: 25.9 pg — ABNORMAL LOW (ref 26.0–34.0)
MCHC: 32.1 g/dL (ref 30.0–36.0)
MCV: 80.7 fL (ref 80.0–100.0)
Platelets: 153 10*3/uL (ref 150–400)
RBC: 3.94 MIL/uL (ref 3.87–5.11)
RDW: 13.1 % (ref 11.5–15.5)
WBC: 8.8 10*3/uL (ref 4.0–10.5)
nRBC: 0 % (ref 0.0–0.2)

## 2023-03-05 LAB — TYPE AND SCREEN
ABO/RH(D): A POS
Antibody Screen: NEGATIVE

## 2023-03-05 LAB — PROTEIN / CREATININE RATIO, URINE
Creatinine, Urine: 59 mg/dL
Total Protein, Urine: <6 mg/dL

## 2023-03-05 LAB — GLUCOSE, CAPILLARY: Glucose-Capillary: 88 mg/dL (ref 70–99)

## 2023-03-05 SURGERY — Surgical Case
Anesthesia: Spinal

## 2023-03-05 MED ORDER — NALOXONE HCL 0.4 MG/ML IJ SOLN: 0.4000 mg | INTRAMUSCULAR | Status: DC | PRN

## 2023-03-05 MED ORDER — ONDANSETRON HCL 4 MG/2ML IJ SOLN
INTRAMUSCULAR | Status: DC | PRN
  Administered 2023-03-05: 4 mg via INTRAVENOUS

## 2023-03-05 MED ORDER — FENTANYL CITRATE (PF) 100 MCG/2ML IJ SOLN
INTRAMUSCULAR | Status: DC | PRN
  Administered 2023-03-05: 15 ug via INTRATHECAL

## 2023-03-05 MED ORDER — MORPHINE SULFATE (PF) 0.5 MG/ML IJ SOLN
INTRAMUSCULAR | Status: AC
  Filled 2023-03-05: qty 10

## 2023-03-05 MED ORDER — TRANEXAMIC ACID-NACL 1000-0.7 MG/100ML-% IV SOLN
INTRAVENOUS | Status: AC
  Filled 2023-03-05: qty 100

## 2023-03-05 MED ORDER — TRANEXAMIC ACID-NACL 1000-0.7 MG/100ML-% IV SOLN
INTRAVENOUS | Status: DC | PRN
  Administered 2023-03-05: 1000 mg via INTRAVENOUS

## 2023-03-05 MED ORDER — PHENYLEPHRINE HCL-NACL 20-0.9 MG/250ML-% IV SOLN
INTRAVENOUS | Status: DC | PRN
  Administered 2023-03-05: 30 ug/min via INTRAVENOUS

## 2023-03-05 MED ORDER — PHENYLEPHRINE 80 MCG/ML (10ML) SYRINGE FOR IV PUSH (FOR BLOOD PRESSURE SUPPORT)
PREFILLED_SYRINGE | INTRAVENOUS | Status: AC
  Filled 2023-03-05: qty 10

## 2023-03-05 MED ORDER — SODIUM CHLORIDE 0.9% FLUSH: 3.0000 mL | INTRAVENOUS | Status: DC | PRN

## 2023-03-05 MED ORDER — SOD CITRATE-CITRIC ACID 500-334 MG/5ML PO SOLN
30.0000 mL | ORAL | Status: AC
  Administered 2023-03-05: 30 mL via ORAL

## 2023-03-05 MED ORDER — ONDANSETRON HCL 4 MG/2ML IJ SOLN
INTRAMUSCULAR | Status: AC
  Filled 2023-03-05: qty 2

## 2023-03-05 MED ORDER — MORPHINE SULFATE (PF) 0.5 MG/ML IJ SOLN
INTRAMUSCULAR | Status: DC | PRN
  Administered 2023-03-05: .15 mg via INTRATHECAL

## 2023-03-05 MED ORDER — DEXAMETHASONE SODIUM PHOSPHATE 10 MG/ML IJ SOLN
INTRAMUSCULAR | Status: AC
  Filled 2023-03-05: qty 1

## 2023-03-05 MED ORDER — DEXTROSE 5 % IV SOLN
500.0000 mg | INTRAVENOUS | Status: DC
  Filled 2023-03-05: qty 5

## 2023-03-05 MED ORDER — ACETAMINOPHEN 10 MG/ML IV SOLN
INTRAVENOUS | Status: DC | PRN
  Administered 2023-03-05: 1000 mg via INTRAVENOUS

## 2023-03-05 MED ORDER — STERILE WATER FOR IRRIGATION IR SOLN
Status: DC | PRN
  Administered 2023-03-05: 1000 mL

## 2023-03-05 MED ORDER — CEFAZOLIN SODIUM-DEXTROSE 2-4 GM/100ML-% IV SOLN
2.0000 g | INTRAVENOUS | Status: AC
  Administered 2023-03-05: 2 g via INTRAVENOUS
  Filled 2023-03-05: qty 100

## 2023-03-05 MED ORDER — ACETAMINOPHEN 10 MG/ML IV SOLN
INTRAVENOUS | Status: AC
  Filled 2023-03-05: qty 100

## 2023-03-05 MED ORDER — DIPHENHYDRAMINE HCL 25 MG PO CAPS: 25.0000 mg | ORAL_CAPSULE | ORAL | Status: DC | PRN

## 2023-03-05 MED ORDER — OXYTOCIN-SODIUM CHLORIDE 30-0.9 UT/500ML-% IV SOLN
INTRAVENOUS | Status: AC
  Filled 2023-03-05: qty 500

## 2023-03-05 MED ORDER — PHENYLEPHRINE HCL-NACL 20-0.9 MG/250ML-% IV SOLN
INTRAVENOUS | Status: AC
  Filled 2023-03-05: qty 250

## 2023-03-05 MED ORDER — LACTATED RINGERS IV BOLUS
1000.0000 mL | Freq: Once | INTRAVENOUS | Status: AC
  Administered 2023-03-05: 1000 mL via INTRAVENOUS

## 2023-03-05 MED ORDER — SODIUM CHLORIDE 0.9 % IR SOLN
Status: DC | PRN
  Administered 2023-03-05: 1

## 2023-03-05 MED ORDER — SOD CITRATE-CITRIC ACID 500-334 MG/5ML PO SOLN
30.0000 mL | Freq: Once | ORAL | Status: DC
Start: 2023-03-05 — End: 2023-03-07
  Filled 2023-03-05: qty 30

## 2023-03-05 MED ORDER — DEXAMETHASONE SODIUM PHOSPHATE 10 MG/ML IJ SOLN
INTRAMUSCULAR | Status: DC | PRN
  Administered 2023-03-05: 10 mg via INTRAVENOUS

## 2023-03-05 MED ORDER — FENTANYL CITRATE (PF) 100 MCG/2ML IJ SOLN
INTRAMUSCULAR | Status: AC
  Filled 2023-03-05: qty 2

## 2023-03-05 MED ORDER — EPHEDRINE 5 MG/ML INJ
INTRAVENOUS | Status: AC
  Filled 2023-03-05: qty 5

## 2023-03-05 MED ORDER — KETOROLAC TROMETHAMINE 30 MG/ML IJ SOLN
30.0000 mg | Freq: Four times a day (QID) | INTRAMUSCULAR | Status: DC | PRN
  Administered 2023-03-06: 30 mg via INTRAVENOUS

## 2023-03-05 MED ORDER — MEPERIDINE HCL 25 MG/ML IJ SOLN: 6.2500 mg | INTRAMUSCULAR | Status: DC | PRN

## 2023-03-05 MED ORDER — FENTANYL CITRATE (PF) 100 MCG/2ML IJ SOLN: 25.0000 ug | INTRAMUSCULAR | Status: DC | PRN

## 2023-03-05 MED ORDER — ONDANSETRON HCL 4 MG/2ML IJ SOLN: 4.0000 mg | Freq: Three times a day (TID) | INTRAMUSCULAR | Status: DC | PRN

## 2023-03-05 MED ORDER — BUPIVACAINE IN DEXTROSE 0.75-8.25 % IT SOLN
INTRATHECAL | Status: DC | PRN
  Administered 2023-03-05: 1.8 mL via INTRATHECAL

## 2023-03-05 MED ORDER — NALOXONE HCL 4 MG/10ML IJ SOLN: 1.0000 ug/kg/h | INTRAVENOUS | Status: DC | PRN

## 2023-03-05 MED ORDER — DIPHENHYDRAMINE HCL 50 MG/ML IJ SOLN
12.5000 mg | INTRAMUSCULAR | Status: DC | PRN
Start: 2023-03-05 — End: 2023-03-07

## 2023-03-05 MED ORDER — OXYTOCIN-SODIUM CHLORIDE 30-0.9 UT/500ML-% IV SOLN
INTRAVENOUS | Status: DC | PRN
  Administered 2023-03-05: 200 mL via INTRAVENOUS
  Administered 2023-03-05: 300 mL via INTRAVENOUS

## 2023-03-05 MED ORDER — KETOROLAC TROMETHAMINE 30 MG/ML IJ SOLN: 30.0000 mg | Freq: Four times a day (QID) | INTRAMUSCULAR | Status: DC | PRN

## 2023-03-05 MED ORDER — CEFAZOLIN SODIUM-DEXTROSE 2-4 GM/100ML-% IV SOLN
INTRAVENOUS | Status: AC
  Filled 2023-03-05: qty 100

## 2023-03-05 MED ORDER — LACTATED RINGERS IV SOLN: INTRAVENOUS | Status: AC

## 2023-03-05 SURGICAL SUPPLY — 37 items
APL PRP STRL LF DISP 70% ISPRP (MISCELLANEOUS) ×4
APL SKNCLS STERI-STRIP NONHPOA (GAUZE/BANDAGES/DRESSINGS) ×2
BENZOIN TINCTURE PRP APPL 2/3 (GAUZE/BANDAGES/DRESSINGS) IMPLANT
CHLORAPREP W/TINT 26 (MISCELLANEOUS) ×4 IMPLANT
CLAMP UMBILICAL CORD (MISCELLANEOUS) ×2 IMPLANT
CLOTH BEACON ORANGE TIMEOUT ST (SAFETY) ×2 IMPLANT
DRSG OPSITE POSTOP 4X10 (GAUZE/BANDAGES/DRESSINGS) ×2 IMPLANT
ELECT REM PT RETURN 9FT ADLT (ELECTROSURGICAL) ×2
ELECTRODE REM PT RTRN 9FT ADLT (ELECTROSURGICAL) ×2 IMPLANT
EXTRACTOR VACUUM KIWI (MISCELLANEOUS) IMPLANT
GAUZE SPONGE 4X4 12PLY STRL LF (GAUZE/BANDAGES/DRESSINGS) IMPLANT
GLOVE SURG ORTHO 8.0 STRL STRW (GLOVE) ×2 IMPLANT
GOWN STRL REUS W/TWL LRG LVL3 (GOWN DISPOSABLE) ×4 IMPLANT
KIT ABG SYR 3ML LUER SLIP (SYRINGE) IMPLANT
LIGASURE IMPACT 36 18CM CVD LR (INSTRUMENTS) IMPLANT
MAT PREVALON FULL STRYKER (MISCELLANEOUS) IMPLANT
NDL HYPO 25X5/8 SAFETYGLIDE (NEEDLE) IMPLANT
NEEDLE HYPO 25X5/8 SAFETYGLIDE (NEEDLE) IMPLANT
NS IRRIG 1000ML POUR BTL (IV SOLUTION) ×2 IMPLANT
PACK C SECTION WH (CUSTOM PROCEDURE TRAY) ×2 IMPLANT
PAD OB MATERNITY 4.3X12.25 (PERSONAL CARE ITEMS) ×2 IMPLANT
RTRCTR C-SECT PINK 25CM LRG (MISCELLANEOUS) IMPLANT
STRIP CLOSURE SKIN 1/2X4 (GAUZE/BANDAGES/DRESSINGS) IMPLANT
SUT MON AB-0 CT1 36 (SUTURE) ×4 IMPLANT
SUT PLAIN 0 NONE (SUTURE) IMPLANT
SUT PLAIN 2 0 (SUTURE) ×2
SUT PLAIN ABS 2-0 CT1 27XMFL (SUTURE) IMPLANT
SUT VIC AB 0 CT1 27 (SUTURE) ×4
SUT VIC AB 0 CT1 27XBRD ANBCTR (SUTURE) ×4 IMPLANT
SUT VIC AB 2-0 CT1 27 (SUTURE) ×2
SUT VIC AB 2-0 CT1 TAPERPNT 27 (SUTURE) ×2 IMPLANT
SUT VIC AB 4-0 SH 27 (SUTURE) ×2
SUT VIC AB 4-0 SH 27XANBCTRL (SUTURE) ×2 IMPLANT
SUT VIC AB CT1 27XBRD ANBCTRL (SUTURE) ×4
TOWEL OR 17X24 6PK STRL BLUE (TOWEL DISPOSABLE) ×2 IMPLANT
TRAY FOLEY W/BAG SLVR 14FR LF (SET/KITS/TRAYS/PACK) ×2 IMPLANT
WATER STERILE IRR 1000ML POUR (IV SOLUTION) ×2 IMPLANT

## 2023-03-05 NOTE — Discharge Summary (Signed)
Postpartum Discharge Summary      Patient Name: Michelle Schultz DOB: 20-May-1990 MRN: [id number]  Date of admission: 03/05/2023 Delivery date:03/05/2023 Delivering provider: Warden Fillers Date of discharge: 03/07/2023  Admitting diagnosis: Gestational hypertension [O13.9] S/P cesarean section [Z98.891] Intrauterine pregnancy: [redacted]w[redacted]d     Secondary diagnosis:  Principal Problem:   Gestational hypertension Active Problems:   Acute blood loss anemia   GDM (gestational diabetes mellitus)   S/P cesarean section  Additional problems: none    Discharge diagnosis: Term Pregnancy Delivered and Csection repeat, bilateral salpingectomy                                                Post partum procedures: none Augmentation: N/A Complications: None  Hospital course: Sceduled C/S   32 y.o. yo G3P3003 at [redacted]w[redacted]d was admitted to the hospital 03/05/2023 for scheduled cesarean section with the following indication:Elective Repeat.Delivery details are as follows:  Membrane Rupture Time/Date: 10:34 PM,03/05/2023  Delivery Method:C-Section, Low Transverse Operative Delivery:N/A Details of operation can be found in separate operative note.  Patient had a postpartum course complicated by nothing. Fasting glucose not checked postpartum. BPs controlled on lasix. Moderate anemia, asymptomatic, oral iron started.  She is ambulating, tolerating a regular diet, passing flatus, and urinating well. Patient is discharged home in stable condition on  03/07/23        Newborn Data: Birth date:03/05/2023 Birth time:10:34 PM Gender:Female Living status:Living Apgars:9 ,9  Weight:2948 g    Magnesium Sulfate received: No BMZ received: No Rhophylac:N/A MMR:N/A Transfusion:No  Immunizations received: Immunization History  Administered Date(s) Administered   Tdap 12/09/2016, 03/17/2021    Physical exam  Vitals:   03/06/23 1000 03/06/23 1400 03/06/23 2122 03/07/23 0630  BP: 107/69 113/74 103/79 105/78   Pulse: 70 78 70 71  Resp: 18 18 16 17   Temp: 98.2 F (36.8 C) 97.7 F (36.5 C) 98.4 F (36.9 C) 98.3 F (36.8 C)  TempSrc: Oral Oral Oral Oral  SpO2:  99% 99% 99%  Weight:      Height:       General: alert, cooperative, and no distress Lochia: appropriate Uterine Fundus: firm Incision: Healing well with no significant drainage DVT Evaluation: No evidence of DVT seen on physical exam. Labs: Lab Results  Component Value Date   WBC 8.3 03/06/2023   HGB 8.1 (L) 03/06/2023   HCT 25.7 (L) 03/06/2023   MCV 81.3 03/06/2023   PLT 126 (L) 03/06/2023      Latest Ref Rng & Units 03/06/2023    4:31 AM  CMP  Creatinine 0.44 - 1.00 mg/dL 9.52    Edinburgh Score:    03/06/2023   12:55 AM  Edinburgh Postnatal Depression Scale Screening Tool  I have been able to laugh and see the funny side of things. 3  I have looked forward with enjoyment to things. 0  I have blamed myself unnecessarily when things went wrong. 0  I have been anxious or worried for no good reason. 0  I have felt scared or panicky for no good reason. 0  Things have been getting on top of me. 1  I have been so unhappy that I have had difficulty sleeping. 0  I have felt sad or miserable. 0  I have been so unhappy that I have been crying. 0  The thought of harming myself  has occurred to me. 0  Edinburgh Postnatal Depression Scale Total 4   Edinburgh Postnatal Depression Scale Total: 4   After visit meds:  Allergies as of 03/07/2023   No Known Allergies      Medication List     STOP taking these medications    Accu-Chek Guide test strip Generic drug: glucose blood   aspirin EC 81 MG tablet   insulin NPH Human 100 UNIT/ML injection Commonly known as: NOVOLIN N   INSULIN SYRINGE 1CC/31GX5/16" 31G X 5/16" 1 ML Misc       TAKE these medications    ferrous sulfate 325 (65 FE) MG EC tablet Take 1 tablet (325 mg total) by mouth every other day.   furosemide 20 MG tablet Commonly known as:  LASIX Take 1 tablet (20 mg total) by mouth daily.   ibuprofen 600 MG tablet Commonly known as: ADVIL Take 1 tablet (600 mg total) by mouth every 6 (six) hours.   oxyCODONE 5 MG immediate release tablet Commonly known as: Oxy IR/ROXICODONE Take 1 tablet (5 mg total) by mouth every 4 (four) hours as needed for moderate pain (pain score 4-6).   prenatal multivitamin Tabs tablet Take 1 tablet by mouth daily at 12 noon.         Discharge home in stable condition Infant Feeding: Breast Infant Disposition:home with mother Discharge instruction: per After Visit Summary and Postpartum booklet. Activity: Advance as tolerated. Pelvic rest for 6 weeks.  Diet: routine diet Future Appointments: Future Appointments  Date Time Provider Department Center  03/08/2023  2:50 PM [facility] NURSE [facility]  03/13/2023  2:50 PM [facility] NURSE [facility]  04/20/2023  8:55 AM Anyanwu, Jethro Bastos, MD [facility]   Follow up Visit: Message sent 11/4 to [facility]  Please schedule this patient for a In person postpartum visit in 4 weeks with the following provider: Any provider. Additional Postpartum F/U:2 hour GTT, Incision check 1 week, and BP check 2-3 days  High risk pregnancy complicated by:  GDM, GHTN, hx of CS Delivery mode:  C-Section, Low Transverse Anticipated Birth Control:  salpingectomy at time of c/s   03/07/2023 Silvano Bilis, MD

## 2023-03-05 NOTE — H&P (Signed)
LABOR AND DELIVERY ADMISSION HISTORY AND PHYSICAL NOTE  HPI: Michelle Schultz is a 32 y.o. G3P2002 at [redacted]w[redacted]d by early ultrasound who presents to maternity admissions reporting elevated BP and blurry vision. Pregnancy c/b obesity, GDMA2, hx HELLP, hx PPH. Receives [facility] with [place].   Patient presents with mild range pressures at home that developed today. Noticed blurry vision as well. Denies HA, RUQ pain, worsening extremity swelling, CP, SOB. Does have a hx of HELLP. Had mild range pressures in office in August that were sustained but has had normal Bps since. Notes sugars under good control with insulin.   +FM. Denies LOF, VB, urinary symptoms, change in vaginal discharge.   She plans on breast feeding feeding. Her contraception plan is: bilateral tubal ligation.  Prenatal History/Complications:  Sono:  @[redacted]w[redacted]d , CWD, normal anatomy, cephalic presentation, posterior fundal placenta, 75%ile, EFW 2917  Pregnancy complications:  Patient Active Problem List   Diagnosis Date Noted   Gestational hypertension 03/05/2023   Obesity affecting pregnancy, antepartum 01/18/2023   Choroid plexus cysts, fetal, affecting care of mother, antepartum 12/20/2022   History of cesarean section 05/28/2021   Transient hypertension of pregnancy in second trimester 04/27/2021   GDM (gestational diabetes mellitus) 03/18/2021   Supervision of high-risk pregnancy 11/19/2020   History of HELLP, currently pregnant 11/19/2020   History of postpartum hemorrhage 11/19/2020    Past Medical History: Past Medical History:  Diagnosis Date   Eczema    Gestational diabetes    History of HELLP syndrome, currently pregnant    History of pre-eclampsia    Pregnancy induced hypertension     Past Surgical History: Past Surgical History:  Procedure Laterality Date   CESAREAN SECTION N/A 01/21/2017   Procedure: CESAREAN SECTION;  Surgeon: Tereso Newcomer, MD;  Location: [facility];  Service: Obstetrics;   Laterality: N/A;   CESAREAN SECTION N/A 05/28/2021   Procedure: CESAREAN SECTION;  Surgeon: Kathrynn Running, MD;  Location: [facility] LD ORS;  Service: Obstetrics;  Laterality: N/A;    Obstetrical History: OB History     Gravida  3   Para  2   Term  2   Preterm      AB      Living  2      SAB      IAB      Ectopic      Multiple  0   Live Births  2           Social History: Social History   Socioeconomic History   Marital status: Married    Spouse name: Deniece Portela   Number of children: Not on file   Years of education: 16   Highest education level: Not on file  Occupational History   Occupation: Real estate  Tobacco Use   Smoking status: Former    Types: Cigarettes   Smokeless tobacco: Never   Tobacco comments:    1 pack/week-quit 2017  Vaping Use   Vaping status: Never Used  Substance and Sexual Activity   Alcohol use: Not Currently    Comment: prior to pregnancy   Drug use: Never   Sexual activity: Yes    Partners: Male    Birth control/protection: None  Other Topics Concern   Not on file  Social History Narrative   Not on file   Social Determinants of Health   Financial Resource Strain: Not on file  Food Insecurity: Not on file  Transportation Needs: Not on file  Physical Activity: Not on  file  Stress: Not on file  Social Connections: Not on file    Family History: Family History  Problem Relation Age of Onset   Hypertension Mother    Heart attack Maternal Grandfather    Heart attack Paternal Grandfather    Breast cancer Neg Hx    Ovarian cancer Neg Hx    Diabetes Neg Hx     Allergies: No Known Allergies  Medications Prior to Admission  Medication Sig Dispense Refill Last Dose   aspirin EC 81 MG tablet Take 162 mg by mouth daily. Swallow whole.   03/05/2023   insulin NPH Human (NOVOLIN N) 100 UNIT/ML injection Inject 0.1 mLs (10 Units total) into the skin at bedtime. 10 mL 3 03/04/2023   Prenatal Vit-Fe Fumarate-FA (PRENATAL  MULTIVITAMIN) TABS tablet Take 1 tablet by mouth daily at 12 noon.   03/04/2023   glucose blood (ACCU-CHEK GUIDE) test strip Use to check blood sugars four times a day was instructed 100 each 12    Insulin Syringe-Needle U-100 (INSULIN SYRINGE 1CC/31GX5/16") 31G X 5/16" 1 ML MISC 10 Units by Does not apply route at bedtime. 90 each 1      Review of Systems  All systems reviewed and negative except as stated in HPI  Physical Exam BP (!) 147/92   Pulse 91   Temp 98 F (36.7 C) (Oral)   Resp 19   Ht 5\' 1"  (1.549 m)   Wt 83.2 kg   LMP 06/11/2022   SpO2 99%   BMI 34.67 kg/m   Physical Exam  Constitutional: Well-developed, well-nourished female in no acute distress  Cardiovascular: normal rate Respiratory: normal effort GI: Abd soft, non-tender MS: Extremities nontender, trace edema, normal ROM Neurologic: Alert and oriented x 4   FHT:  Baseline 150, moderate variability, accelerations present, no decelerations Contractions: UI  Prenatal labs: ABO, Rh:   Antibody:   Rubella: Immune (04/15 0000) RPR: Non Reactive (08/20 0917)  HBsAg: Negative (04/15 0000)  HIV: Non Reactive (08/20 0917)  GC/Chlamydia:  Neisseria Gonorrhea  Date Value Ref Range Status  02/28/2023 Negative  Final   Chlamydia  Date Value Ref Range Status  02/28/2023 Negative  Final   GBS: Negative/-- (10/29 0946)    Prenatal Transfer Tool  Maternal Diabetes: Yes:  Diabetes Type:  Insulin/Medication controlled Genetic Screening: Normal Maternal Ultrasounds/Referrals: Isolated choroid plexus cyst Fetal Ultrasounds or other Referrals:  Referred to Materal Fetal Medicine  Maternal Substance Abuse:  No Significant Maternal Medications:  Meds include: Other: ASA, NPH Significant Maternal Lab Results: Group B Strep positive  No results found for this or any previous visit (from the past 24 hour(s)).  Assessment: Michelle Schultz is a 32 y.o. G3P2002 at [redacted]w[redacted]d here for new diagnosis of gHTN from MAU.  PreE  labs pending. Will proceed with repeat cesarean delivery with BTL. Lasix PP.  Joanne Gavel, MD [place] Fellow Center for [facility], [facility]  03/05/2023, 8:56 PM

## 2023-03-05 NOTE — Transfer of Care (Signed)
Immediate Anesthesia Transfer of Care Note  Patient: Michelle Schultz  Procedure(s) Performed: CESAREAN SECTION WITH BILATERAL TUBAL LIGATION BILATERAL TUBAL LIGATION (Bilateral)  Patient Location: PACU  Anesthesia Type:Spinal  Level of Consciousness: awake, alert , and oriented  Airway & Oxygen Therapy: Patient Spontanous Breathing  Post-op Assessment: Report given to RN and Post -op Vital signs reviewed and stable  Post vital signs: Reviewed and stable  Last Vitals:  Vitals Value Taken Time  BP 117/73 03/05/23 2338  Temp    Pulse 74 03/05/23 2343  Resp 18 03/05/23 2343  SpO2 96 % 03/05/23 2343  Vitals shown include unfiled device data.  Last Pain:  Vitals:   03/05/23 2005  TempSrc: Oral         Complications: No notable events documented.

## 2023-03-05 NOTE — Anesthesia Procedure Notes (Signed)
Spinal  Patient location during procedure: OR Start time: 03/05/2023 10:03 PM End time: 03/05/2023 10:06 PM Reason for block: surgical anesthesia Staffing Performed: anesthesiologist  Anesthesiologist: Mal Amabile, MD Performed by: Mal Amabile, MD Authorized by: Mal Amabile, MD   Preanesthetic Checklist Completed: patient identified, IV checked, site marked, risks and benefits discussed, surgical consent, monitors and equipment checked, pre-op evaluation and timeout performed Spinal Block Patient position: sitting Prep: DuraPrep and site prepped and draped Patient monitoring: heart rate, cardiac monitor, continuous pulse ox and blood pressure Approach: midline Location: L3-4 Injection technique: single-shot Needle Needle type: Pencan  Needle gauge: 24 G Needle length: 9 cm Needle insertion depth: 6 cm Assessment Sensory level: T4 Events: CSF return Additional Notes Patient tolerated procedure well. Adequate sensory level.

## 2023-03-05 NOTE — Op Note (Signed)
Michelle Schultz PROCEDURE DATE: 03/05/2023  PREOPERATIVE DIAGNOSES: Intrauterine pregnancy at [redacted]w[redacted]d weeks gestation;  elective repeat, two prior Csections  POSTOPERATIVE DIAGNOSES: The same, viable infant delivered  PROCEDURE: RepeatLow Transverse Cesarean Section  SURGEON:  Dr. Donavan Foil   ASSISTANT:  Hessie Dibble, MD An experienced assistant was required given the standard of surgical care given the complexity of the case.  This assistant was needed for exposure, dissection, suctioning, retraction, instrument exchange, assisting with delivery with administration of fundal pressure, and for overall help during the procedure.  ANESTHESIOLOGY TEAM: Anesthesiologist: Mal Amabile, MD CRNA: Claudina Lick, CRNA; Renford Dills, CRNA  INDICATIONS: Michelle Schultz is a 32 y.o. [phone] at [redacted]w[redacted]d here for cesarean section secondary to the indications listed under preoperative diagnoses; please see preoperative note for further details.  The risks of surgery were discussed with the patient including but were not limited to: bleeding which may require transfusion or reoperation; infection which may require antibiotics; injury to bowel, bladder, ureters or other surrounding organs; injury to the fetus; need for additional procedures including hysterectomy in the event of a life-threatening hemorrhage; formation of adhesions; placental abnormalities wth subsequent pregnancies; incisional problems; thromboembolic phenomenon and other postoperative/anesthesia complications.  The patient concurred with the proposed plan, giving informed written consent for the procedure.    FINDINGS:  Viable female infant in cephalic presentation.  Apgars 9 and 9.  Amniotic fluid: clear.  Intact placenta, three vessel cord.  Normal uterus, fallopian tubes and ovaries bilaterally.  ANESTHESIA: spinal INTRAVENOUS FLUIDS: 2000 ml   ESTIMATED BLOOD LOSS: 383 ml URINE OUTPUT:  300 ml SPECIMENS: Placenta sent to L&D  . COMPLICATIONS: None immediate  PROCEDURE IN DETAIL:  The patient preoperatively received intravenous antibiotics and had sequential compression devices applied to her lower extremities.  She was then taken to the operating room where spinal anesthesia was found to be adequate. She was then placed in a dorsal supine position with a leftward tilt, and prepped and draped in a sterile manner.  A foley catheter was  placed into her bladder and attached to constant gravity.  After an adequate timeout was performed, a Pfannenstiel skin incision was made with scalpel and carried through to the underlying layer of fascia. The fascia was incised in the midline, and this incision was extended sharply with mayo scissors. The rectus muscles were separated in the midline and the peritoneum was entered bluntly.   The Alexis self-retaining retractor was introduced into the abdominal cavity.  Attention was turned to the lower uterine segment where a low transverse hysterotomy was made with a scalpel and extended bluntly in caudad and cephalad directions.  The infant was successfully delivered, the cord was clamped and cut after one minute, and the infant was handed over to the awaiting neonatology team. Uterine massage was then administered, and the placenta delivered intact with a three-vessel cord. The uterus was cleared of clots and debris.  The hysterotomy was closed with 0-Monocryl in a running fashion.      The pelvis was cleared of all clot and debris. Hemostasis was confirmed on all surfaces. The uterus incision was once again inspected and found to be hemostatic. BTL***   The retractor was removed.  The peritoneum was closed with a 2-0 Vicryl running stitch. The fascia was then closed using 0 Vicryl in a running fashion.  The subcutaneous layer was irrigated, any areas of bleeding were cauterized with the bovie,  was reapproximated with 2-0 plain gut in a running fashion, was  found to be hemostatic.. . The  skin was closed with a 4-0 Monocryl subcuticular stitch. The patient tolerated the procedure well. Sponge, instrument and needle counts were correct x 3.  She was taken to the recovery room in stable condition.   Hessie Dibble, MD FMOB Fellow, Faculty practice [facility], Center for [facility] 03/05/23  11:27 PM

## 2023-03-05 NOTE — MAU Note (Signed)
.  Michelle Schultz is a 32 y.o. at [redacted]w[redacted]d here in MAU reporting: took BP 3x it was elevated - 130/96 144/89 136/95.  Reports blurred vision that comes on and off. Denies HA, epigastric pain, VB, or LOF. +FM   Onset of complaint: 1800 Pain score: 0 Vitals:   03/05/23 2005  BP: 135/88  Pulse: 98  Resp: 19  Temp: 98 F (36.7 C)  SpO2: 100%     FHT:146 Lab orders placed from triage:  UA

## 2023-03-05 NOTE — Anesthesia Preprocedure Evaluation (Addendum)
Anesthesia Evaluation  Patient identified by MRN, date of birth, ID band Patient awake    Reviewed: Allergy & Precautions, H&P , NPO status , Patient's Chart, lab work & pertinent test results  History of Anesthesia Complications Negative for: history of anesthetic complications  Airway Mallampati: III  TM Distance: >3 FB Neck ROM: full    Dental no notable dental hx. (+) Teeth Intact, Dental Advisory Given   Pulmonary former smoker   Pulmonary exam normal breath sounds clear to auscultation       Cardiovascular hypertension, Normal cardiovascular exam Rhythm:Regular Rate:Normal     Neuro/Psych negative neurological ROS  negative psych ROS   GI/Hepatic negative GI ROS, Neg liver ROS,,,  Endo/Other  diabetes, Well Controlled, Gestational, Insulin Dependent Hyperthyroidism Obesity  Renal/GU negative Renal ROS  negative genitourinary   Musculoskeletal negative musculoskeletal ROS (+)    Abdominal  (+) + obese  Peds  Hematology  (+) Blood dyscrasia, anemia   Anesthesia Other Findings   Reproductive/Obstetrics (+) Pregnancy Hx/o previous C/Section x 2 37 2/7 weeks PPROM in labor                             Anesthesia Physical Anesthesia Plan  ASA: 2 and emergent  Anesthesia Plan: Spinal   Post-op Pain Management: Minimal or no pain anticipated and Regional block*   Induction: Intravenous  PONV Risk Score and Plan: 4 or greater and Scopolamine patch - Pre-op, Treatment may vary due to age or medical condition and Ondansetron  Airway Management Planned: Natural Airway  Additional Equipment: None and Fetal Monitoring  Intra-op Plan:   Post-operative Plan:   Informed Consent: I have reviewed the patients History and Physical, chart, labs and discussed the procedure including the risks, benefits and alternatives for the proposed anesthesia with the patient or authorized  representative who has indicated his/her understanding and acceptance.     Dental advisory given  Plan Discussed with: CRNA, Anesthesiologist and Surgeon  Anesthesia Plan Comments:         Anesthesia Quick Evaluation

## 2023-03-05 NOTE — MAU Provider Note (Signed)
MAU Provider Note  Chief Complaint: Hypertension and Blurred Vision   Provider seen 2024    SUBJECTIVE HPI: Michelle Schultz is a 32 y.o. G3P2002 at [redacted]w[redacted]d by early ultrasound who presents to maternity admissions reporting elevated BP and blurry vision. Pregnancy c/b obesity, GDMA2, hx HELLP, hx PPH. Receives [facility] with [place].  Patient presents with mild range pressures at home that developed today. Noticed blurry vision as well. Denies HA, RUQ pain, worsening extremity swelling, CP, SOB. Does have a hx of HELLP. Had mild range pressures in office in August that were sustained but has had normal Bps since. Notes sugars under good control with insulin.  +FM. Denies LOF, VB, urinary symptoms, change in vaginal discharge.   HPI  Past Medical History:  Diagnosis Date   Eczema    Gestational diabetes    History of HELLP syndrome, currently pregnant    History of pre-eclampsia    Pregnancy induced hypertension    Past Surgical History:  Procedure Laterality Date   CESAREAN SECTION N/A 01/21/2017   Procedure: CESAREAN SECTION;  Surgeon: Michelle Newcomer, MD;  Location: [facility];  Service: Obstetrics;  Laterality: N/A;   CESAREAN SECTION N/A 05/28/2021   Procedure: CESAREAN SECTION;  Surgeon: Michelle Running, MD;  Location: [facility] LD ORS;  Service: Obstetrics;  Laterality: N/A;   Social History   Socioeconomic History   Marital status: Married    Spouse name: Michelle Schultz   Number of children: Not on file   Years of education: 16   Highest education level: Not on file  Occupational History   Occupation: Real estate  Tobacco Use   Smoking status: Former    Types: Cigarettes   Smokeless tobacco: Never   Tobacco comments:    1 pack/week-quit 2017  Vaping Use   Vaping status: Never Used  Substance and Sexual Activity   Alcohol use: Not Currently    Comment: prior to pregnancy   Drug use: Never   Sexual activity: Yes    Partners: Male    Birth control/protection: None   Other Topics Concern   Not on file  Social History Narrative   Not on file   Social Determinants of Health   Financial Resource Strain: Not on file  Food Insecurity: Not on file  Transportation Needs: Not on file  Physical Activity: Not on file  Stress: Not on file  Social Connections: Not on file  Intimate Partner Violence: Not on file   No current facility-administered medications on file prior to encounter.   Current Outpatient Medications on File Prior to Encounter  Medication Sig Dispense Refill   aspirin EC 81 MG tablet Take 162 mg by mouth daily. Swallow whole.     insulin NPH Human (NOVOLIN N) 100 UNIT/ML injection Inject 0.1 mLs (10 Units total) into the skin at bedtime. 10 mL 3   Prenatal Vit-Fe Fumarate-FA (PRENATAL MULTIVITAMIN) TABS tablet Take 1 tablet by mouth daily at 12 noon.     glucose blood (ACCU-CHEK GUIDE) test strip Use to check blood sugars four times a day was instructed 100 each 12   Insulin Syringe-Needle U-100 (INSULIN SYRINGE 1CC/31GX5/16") 31G X 5/16" 1 ML MISC 10 Units by Does not apply route at bedtime. 90 each 1   No Known Allergies  ROS:  Pertinent positives/negatives listed above.  I have reviewed patient's Past Medical Hx, Surgical Hx, Family Hx, Social Hx, medications and allergies.   Physical Exam  Patient Vitals for the past 24 hrs:  BP  Temp Temp src Pulse Resp SpO2 Height Weight  03/05/23 2030 (!) 147/92 -- -- 91 -- 99 % -- --  03/05/23 2025 -- -- -- -- -- 100 % -- --  03/05/23 2022 (!) 142/92 -- -- 99 -- -- -- --  03/05/23 2020 -- -- -- -- -- 99 % -- --  03/05/23 2005 135/88 98 F (36.7 C) Oral 98 19 100 % 5\' 1"  (1.549 m) 83.2 kg   Constitutional: Well-developed, well-nourished female in no acute distress  Cardiovascular: normal rate Respiratory: normal effort GI: Abd soft, non-tender MS: Extremities nontender, trace edema, normal ROM Neurologic: Alert and oriented x 4  FHT:  Baseline 150, moderate variability, accelerations  present, no decelerations Contractions: UI  LAB RESULTS No results found for this or any previous visit (from the past 24 hour(s)).     IMAGING US FETAL BPP W/NONSTRESS  Result Date: 03/03/2023 ----------------------------------------------------------------------  OBSTETRICS REPORT                       (Signed Final 03/03/2023 07:22 am) ---------------------------------------------------------------------- Patient Info  ID #:       [id number]                          D.O.B.:  Feb 18, 1991 (32 yrs)  Name:       Michelle Schultz                   Visit Date: 02/28/2023 10:12 am ---------------------------------------------------------------------- Performed By  Attending:        Jaynie Schultz         Ref. Address:     [address]                    [address]                                                             [address]  Performed By:     Michelle Marten RN     [facility]         [facility]                                                             [facility]  Referred By:      [facility] Michelle Schultz ---------------------------------------------------------------------- Orders  #  Description  Code        Ordered By  1  US FETAL BPP W/NONSTRESS              14782.9     Michelle Schultz ----------------------------------------------------------------------  #  Order #                     Accession #                Episode #  1  [id number]                   [id number]                 [id number] ---------------------------------------------------------------------- Indications  [redacted] weeks gestation of pregnancy                Z3A.36  Gestational diabetes in pregnancy, insulin     O24.414  controlled  Hypertension - Chronic/Pre-existing            O10.019 ---------------------------------------------------------------------- Fetal Evaluation  Num Of Fetuses:          1  Cardiac Activity:       Observed  Presentation:           Cephalic  Amniotic Fluid  AFI FV:      Within normal limits  AFI Sum(cm)     %Tile       Largest Pocket(cm)  13.12           46          5.57  RUQ(cm)       RLQ(cm)       LUQ(cm)        LLQ(cm)  5.57          2.11          3.96           1.48 ---------------------------------------------------------------------- Biophysical Evaluation  Amniotic F.V:   Within normal limits       F. Tone:        Observed  F. Movement:    Observed                   N.S.T:          Reactive  F. Breathing:   Observed                   Score:          10/10 ---------------------------------------------------------------------- OB History  Blood Type:   A+  Gravidity:    3         Term:   2  Living:       2 ---------------------------------------------------------------------- Gestational Age  LMP:           37w 3d        Date:  06/11/22                  EDD:   03/18/23  Clinical EDD:  36w 4d                                        EDD:   03/24/23  Best:          36w 4d     Det. By:  Clinical EDD             EDD:   03/24/23 ---------------------------------------------------------------------- Impression  Antenatal testing is  reassuring. BPP 10/10. Normal amniotic  fluid volume. Cephalic presentation. ---------------------------------------------------------------------- Recommendations  Continue weekly antenatal testing till delivery . ----------------------------------------------------------------------              Michelle Collins, MD Electronically Signed Final Report   03/03/2023 07:22 am ----------------------------------------------------------------------   [place] MFM FETAL BPP WO NON STRESS  Result Date: 02/20/2023 ----------------------------------------------------------------------  OBSTETRICS REPORT                       (Signed Final 02/20/2023 09:13 am) ---------------------------------------------------------------------- Patient Info  ID #:       [id number]                           D.O.B.:  June 28, 1990 (32 yrs)  Name:       Michelle Schultz                   Visit Date: 02/20/2023 07:33 am ---------------------------------------------------------------------- Performed By  Attending:        Ma Rings MD         Ref. Address:     [address]                                                             [address]  Performed By:     Tommie Raymond BS,       [facility]                    RD[facility], [facility]T                                [facility]  Referred By:      [facility] ---------------------------------------------------------------------- Orders  #  Description                           Code        Ordered By  1  [place] MFM FETAL BPP WO NON               76819.01    YU FANG     STRESS  2  [place] MFM OB FOLLOW UP                   91478.29    YU FANG ----------------------------------------------------------------------  #  Order #  Accession #                Episode #  1  [id number]                   [id number]                 [id number]  2  [id number]                   [id number]                 [id number] ---------------------------------------------------------------------- Indications  Gestational diabetes in pregnancy, insulin     O24.414  controlled  Obesity complicating pregnancy, third          O99.213  trimester (PG BMI 32)  Poor obstetric history: Prior fetal            O09.299  macrosomia, antepartum ( 4450g)  Poor obstetric history: Previous               O09.299  preeclampsia (HELLP)  [redacted] weeks gestation of pregnancy                Z3A.35  Previous cesarean delivery, antepartum         O34.219  Poor obstetric history: Previous gestational   O09.299  diabetes  Neg MAT 21, neg AFP  Encounter for other antenatal screening        Z36.2  follow-up  ---------------------------------------------------------------------- Fetal Evaluation  Num Of Fetuses:         1  Fetal Heart Rate(bpm):  135  Cardiac Activity:       Observed  Presentation:           Cephalic  Placenta:               Posterior Fundal  P. Cord Insertion:      Previously seen  Amniotic Fluid  AFI FV:      Within normal limits  AFI Sum(cm)     %Tile       Largest Pocket(cm)  12.43           39          5.24  RUQ(cm)       RLQ(cm)       LUQ(cm)        LLQ(cm)  0             2.98          4.2            5.24 ---------------------------------------------------------------------- Biophysical Evaluation  Amniotic F.V:   Within normal limits       F. Tone:        Observed  F. Movement:    Observed                   Score:          8/8  F. Breathing:   Observed ---------------------------------------------------------------------- Biometry  BPD:      86.1  mm     G. Age:  34w 5d         34  %    CI:        75.98   %    70 - 86  FL/HC:      21.8   %    20.1 - 22.3  HC:      313.1  mm     G. Age:  35w 1d         13  %    HC/AC:      0.93        0.93 - 1.11  AC:      336.8  mm     G. Age:  37w 4d         97  %    FL/BPD:     79.2   %    71 - 87  FL:       68.2  mm     G. Age:  35w 0d         33  %    FL/AC:      20.2   %    20 - 24  LV:        5.1  mm  Est. FW:    2917  gm      6 lb 7 oz     75  % ---------------------------------------------------------------------- OB History  Blood Type:   A+  Gravidity:    3         Term:   2  Living:       2 ---------------------------------------------------------------------- Gestational Age  LMP:           36w 2d        Date:  06/11/22                  EDD:   03/18/23  Clinical EDD:  46N 3d                                        EDD:   03/24/23  U/S Today:     35w 4d                                        EDD:   03/23/23  Best:          35w 3d     Det. By:  Clinical EDD             EDD:   03/24/23  ---------------------------------------------------------------------- Targeted Anatomy  Central Nervous System  Calvarium/Cranial V.:  Appears normal         Cereb./Vermis:          Previously seen  Cavum:                 Appears normal         Cisterna Magna:         Previously seen  Lateral Ventricles:    Appears normal         Midline Falx:           Appears normal  Choroid Plexus:        Not well visualized  Spine  Cervical:              Ltd previously         Sacral:                 Ltd previously  Thoracic:  Ltd previously         Shape/Curvature:        Previously seen  Lumbar:                Ltd previously  Head/Neck  Lips:                  Previously seen        Profile:                Not well visualized  Neck:                  Previously seen        Orbits/Eyes:            Previously seen  Nuchal Fold:           Not applicable         Mandible:               Not well visualized  Nasal Bone:            Not well visualized    Maxilla:                Not well visualized  Thorax  4 Chamber View:        Appears normal         SVC:                    Previously seen  Cardiac Activity:      Observed               Interventr. Septum:     Previously seen  Cardiac Rhythm:        Normal                 Cardiac Axis:           Normal  Cardiac Situs:         Appears normal         Diaphragm:              Appears normal  Rt Outflow Tract:      Previously seen        3 Vessel View:          Previously seen  Lt Outflow Tract:      Appears normal         3 V Trachea View:       Previously seen  Aortic Arch:           Previously seen        IVC:                    Previously seen  Ductal Arch:           Not well visualized    Crossing:               Previously seen  Abdomen  Ventral Wall:          Appears normal         Lt Kidney:              Appears normal  Cord Insertion:        Limited                Rt Kidney:              Appears normal  Situs:  Previously seen        Bladder:                 Appears normal  Stomach:               Appears normal  Extremities  Lt Humerus:            Not well visualized    Lt Femur:               Previously seen  Rt Humerus:            Previously seen        Rt Femur:               Previously seen  Lt Forearm:            Previously seen        Lt Lower Leg:           Previously seen  Rt Forearm:            Previously seen        Rt Lower Leg:           Previously seen  Lt Hand:               Previously seen        Lt Foot:                Foot vis. previously  Rt Hand:               Hand vis. previously   Rt Foot:                Foot vis. previously  Other  Umbilical Cord:        Previously seen        Genitalia:              Previously seen  Comment:     Adv Technically difficult due to gestational age. ---------------------------------------------------------------------- Cervix Uterus Adnexa  Cervix  Not visualized (advanced GA >24wks)  Uterus  No abnormality visualized.  Right Ovary  Within normal limits.  Left Ovary  Within normal limits.  Cul De Sac  No free fluid seen.  Adnexa  No abnormality visualized ---------------------------------------------------------------------- Comments  This patient was seen for a follow up growth scan due to  gestational diabetes that is treated with insulin.  She denies  any problems since her last exam.  She was informed that the fetal growth and amniotic fluid  level appears appropriate for her gestational age.  A BPP performed today was 8 out of 8.  Due to gestational diabetes treated with insulin, she should  continue weekly fetal testing in your office until delivery.  These tests have already been scheduled.  The patient has a repeat cesarean delivery scheduled on  March 17, 2023.  No further exams were scheduled in our office. ----------------------------------------------------------------------                   Ma Rings, MD Electronically Signed Final Report   02/20/2023 09:13 am  ----------------------------------------------------------------------   [place] MFM OB FOLLOW UP  Result Date: 02/20/2023 ----------------------------------------------------------------------  OBSTETRICS REPORT                       (Signed Final 02/20/2023 09:13 am) ---------------------------------------------------------------------- Patient Info  ID #:       [id number]  D.O.B.:  [birth year] (32 yrs)  Name:       CHANCY CLAROS                   Visit Date: 02/20/2023 07:33 am ---------------------------------------------------------------------- Performed By  Attending:        Ma Rings MD         Ref. Address:     [address]                                                             [address]  Performed By:     Tommie Raymond BS,       [facility] RVT                                [facility]  Referred By:      [facility] ---------------------------------------------------------------------- Orders  #  Description                           Code        Ordered By  1  [place] MFM FETAL BPP WO NON               76819.01    YU FANG     STRESS  2  [place] MFM OB FOLLOW UP                   [id number]    YU FANG ----------------------------------------------------------------------  #  Order #                     Accession #                Episode #  1  [id number]                   [id number]                 [id number]  2  [id number]                   [id number]                 [id number] ---------------------------------------------------------------------- Indications  Gestational diabetes in pregnancy, insulin     O24.414  controlled  Obesity complicating pregnancy,  third          O99.213  trimester (PG BMI 32)  Poor obstetric history: Prior fetal            O09.299  macrosomia, antepartum ( 4010U)  Poor  obstetric history: Previous               O09.299  preeclampsia (HELLP)  [redacted] weeks gestation of pregnancy                Z3A.35  Previous cesarean delivery, antepartum         O34.219  Poor obstetric history: Previous gestational   O09.299  diabetes  Neg MAT 21, neg AFP  Encounter for other antenatal screening        Z36.2  follow-up ---------------------------------------------------------------------- Fetal Evaluation  Num Of Fetuses:         1  Fetal Heart Rate(bpm):  135  Cardiac Activity:       Observed  Presentation:           Cephalic  Placenta:               Posterior Fundal  P. Cord Insertion:      Previously seen  Amniotic Fluid  AFI FV:      Within normal limits  AFI Sum(cm)     %Tile       Largest Pocket(cm)  12.43           39          5.24  RUQ(cm)       RLQ(cm)       LUQ(cm)        LLQ(cm)  0             2.98          4.2            5.24 ---------------------------------------------------------------------- Biophysical Evaluation  Amniotic F.V:   Within normal limits       F. Tone:        Observed  F. Movement:    Observed                   Score:          8/8  F. Breathing:   Observed ---------------------------------------------------------------------- Biometry  BPD:      86.1  mm     G. Age:  34w 5d         34  %    CI:        75.98   %    70 - 86                                                          FL/HC:      21.8   %    20.1 - 22.3  HC:      313.1  mm     G. Age:  35w 1d         13  %    HC/AC:      0.93        0.93 - 1.11  AC:      336.8  mm     G. Age:  37w 4d         97  %    FL/BPD:  79.2   %    71 - 87  FL:       68.2  mm     G. Age:  35w 0d         33  %    FL/AC:      20.2   %    20 - 24  LV:        5.1  mm  Est. FW:    2917  gm      6 lb 7 oz     75  % ---------------------------------------------------------------------- OB History  Blood Type:   A+  Gravidity:    3         Term:   2  Living:       2 ----------------------------------------------------------------------  Gestational Age  LMP:           36w 2d        Date:  06/11/22                  EDD:   03/18/23  Clinical EDD:  14N 3d                                        EDD:   03/24/23  U/S Today:     35w 4d                                        EDD:   03/23/23  Best:          35w 3d     Det. By:  Clinical EDD             EDD:   03/24/23 ---------------------------------------------------------------------- Targeted Anatomy  Central Nervous System  Calvarium/Cranial V.:  Appears normal         Cereb./Vermis:          Previously seen  Cavum:                 Appears normal         Cisterna Magna:         Previously seen  Lateral Ventricles:    Appears normal         Midline Falx:           Appears normal  Choroid Plexus:        Not well visualized  Spine  Cervical:              Ltd previously         Sacral:                 Ltd previously  Thoracic:              Ltd previously         Shape/Curvature:        Previously seen  Lumbar:                Ltd previously  Head/Neck  Lips:                  Previously seen        Profile:                Not well visualized  Neck:  Previously seen        Orbits/Eyes:            Previously seen  Nuchal Fold:           Not applicable         Mandible:               Not well visualized  Nasal Bone:            Not well visualized    Maxilla:                Not well visualized  Thorax  4 Chamber View:        Appears normal         SVC:                    Previously seen  Cardiac Activity:      Observed               Interventr. Septum:     Previously seen  Cardiac Rhythm:        Normal                 Cardiac Axis:           Normal  Cardiac Situs:         Appears normal         Diaphragm:              Appears normal  Rt Outflow Tract:      Previously seen        3 Vessel View:          Previously seen  Lt Outflow Tract:      Appears normal         3 V Trachea View:       Previously seen  Aortic Arch:           Previously seen        IVC:                    Previously seen  Ductal  Arch:           Not well visualized    Crossing:               Previously seen  Abdomen  Ventral Wall:          Appears normal         Lt Kidney:              Appears normal  Cord Insertion:        Limited                Rt Kidney:              Appears normal  Situs:                 Previously seen        Bladder:                Appears normal  Stomach:               Appears normal  Extremities  Lt Humerus:            Not well visualized    Lt Femur:               Previously seen  Rt Humerus:  Previously seen        Rt Femur:               Previously seen  Lt Forearm:            Previously seen        Lt Lower Leg:           Previously seen  Rt Forearm:            Previously seen        Rt Lower Leg:           Previously seen  Lt Hand:               Previously seen        Lt Foot:                Foot vis. previously  Rt Hand:               Hand vis. previously   Rt Foot:                Foot vis. previously  Other  Umbilical Cord:        Previously seen        Genitalia:              Previously seen  Comment:     Adv Technically difficult due to gestational age. ---------------------------------------------------------------------- Cervix Uterus Adnexa  Cervix  Not visualized (advanced GA >24wks)  Uterus  No abnormality visualized.  Right Ovary  Within normal limits.  Left Ovary  Within normal limits.  Cul De Sac  No free fluid seen.  Adnexa  No abnormality visualized ---------------------------------------------------------------------- Comments  This patient was seen for a follow up growth scan due to  gestational diabetes that is treated with insulin.  She denies  any problems since her last exam.  She was informed that the fetal growth and amniotic fluid  level appears appropriate for her gestational age.  A BPP performed today was 8 out of 8.  Due to gestational diabetes treated with insulin, she should  continue weekly fetal testing in your office until delivery.  These tests have already been  scheduled.  The patient has a repeat cesarean delivery scheduled on  March 17, 2023.  No further exams were scheduled in our office. ----------------------------------------------------------------------                   Ma Rings, MD Electronically Signed Final Report   02/20/2023 09:13 am ----------------------------------------------------------------------   US FETAL BPP W/NONSTRESS  Result Date: 02/15/2023 ----------------------------------------------------------------------  OBSTETRICS REPORT                       (Signed Final 02/15/2023 10:28 am) ---------------------------------------------------------------------- Patient Info  ID #:       [id number]                          D.O.B.:  08/17/1990 (32 yrs)  Name:       Michelle Schultz                   Visit Date: 02/14/2023 10:58 am ---------------------------------------------------------------------- Performed By  Attending:        Mauston Bing MD     Ref. Address:     [address]  Performed By:     Michelle Marten RN     Location:         Center for                                                             [facility]  Referred By:      [facility] Michelle Schultz ---------------------------------------------------------------------- Orders  #  Description                           Code        Ordered By  1  US FETAL BPP W/NONSTRESS              45409.8     [place] ----------------------------------------------------------------------  #  Order #                     Accession #                Episode #  1  [id number]                   [id number]                 [id number] ---------------------------------------------------------------------- Indications  [redacted] weeks gestation of pregnancy                Z3A.34  Gestational diabetes in pregnancy,  insulin     O24.414  controlled ---------------------------------------------------------------------- Fetal Evaluation  Num Of Fetuses:         1  Cardiac Activity:       Observed  Presentation:           Cephalic  Amniotic Fluid  AFI FV:      Within normal limits  AFI Sum(cm)     %Tile       Largest Pocket(cm)  11.76           33          3.76  RUQ(cm)       RLQ(cm)       LUQ(cm)        LLQ(cm)  3.63          1.22          3.76           3.15 ---------------------------------------------------------------------- Biophysical Evaluation  Amniotic F.V:   Within normal limits       F. Tone:        Observed  F. Movement:    Observed  N.S.T:          Reactive  F. Breathing:   Observed                   Score:          10/10 ---------------------------------------------------------------------- OB History  Blood Type:   A+  Gravidity:    3         Term:   2  Living:       2 ---------------------------------------------------------------------- Gestational Age  LMP:           35w 3d        Date:  06/11/22                  EDD:   03/18/23  Clinical EDD:  34w 4d                                        EDD:   03/24/23  Best:          34w 4d     Det. By:  Clinical EDD             EDD:   03/24/23 ---------------------------------------------------------------------- Impression  Reassuring antenatal testing ---------------------------------------------------------------------- Recommendations  Continue weekly testing. ----------------------------------------------------------------------                 Novelty Bing, MD Electronically Signed Final Report   02/15/2023 10:28 am ----------------------------------------------------------------------   US FETAL BPP W/NONSTRESS  Result Date: 02/13/2023 ----------------------------------------------------------------------  OBSTETRICS REPORT                       (Signed Final 02/13/2023 08:06 am)  ---------------------------------------------------------------------- Patient Info  ID #:       [id number]                          D.O.B.:  10-13-1990 (32 yrs)  Name:       Michelle Schultz                   Visit Date: 02/07/2023 09:10 am ---------------------------------------------------------------------- Performed By  Attending:        Jaynie Schultz         Ref. Address:     [address]                    [address]                                                             [address]  Performed By:     Michelle Marten RN     Location:         [facility] for                                                             [facility]  Referred By:      [facility] Michelle Schultz ---------------------------------------------------------------------- Orders  #  Description                           Code        Ordered By  1  US FETAL BPP W/NONSTRESS              16109.6     Michelle Schultz ----------------------------------------------------------------------  #  Order #                     Accession #                Episode #  1  [id number]                   [id number]                 [id number] ---------------------------------------------------------------------- Indications  [redacted] weeks gestation of pregnancy                Z3A.33  Gestational diabetes in pregnancy, insulin     O24.414  controlled ---------------------------------------------------------------------- Fetal Evaluation  Num Of Fetuses:         1  Cardiac Activity:       Observed  Presentation:           Cephalic  Amniotic Fluid  AFI FV:      Within normal limits  AFI Sum(cm)     %Tile       Largest Pocket(cm)  14.35           50          5.4  RUQ(cm)       RLQ(cm)       LUQ(cm)        LLQ(cm)  2.77          5.4           1.98           4.2 ---------------------------------------------------------------------- Biophysical Evaluation  Amniotic  F.V:   Within normal limits       F. Tone:        Observed  F. Movement:    Observed                   N.S.T:          Reactive  F. Breathing:   Observed                   Score:          10/10 ---------------------------------------------------------------------- OB History  Blood Type:   A+  Gravidity:    3         Term:   2  Living:       2 ---------------------------------------------------------------------- Gestational Age  LMP:           34w 3d        Date:  06/11/22                  EDD:   03/18/23  Clinical  EDD:  33w 4d                                        EDD:   03/24/23  Best:          33w 4d     Det. By:  Clinical EDD             EDD:   03/24/23 ---------------------------------------------------------------------- Impression  Antenatal testing is reassuring. BPP 10/10.  Normal amniotic  fluid volume. ---------------------------------------------------------------------- Recommendations  Continue weekly antenatal testing till delivery . ----------------------------------------------------------------------               Michelle Collins, MD Electronically Signed Final Report   02/13/2023 08:06 am ----------------------------------------------------------------------    MAU Management/MDM: Orders Placed This Encounter  Procedures   Urinalysis, Routine w reflex microscopic -Urine, Clean Catch   CBC   Comprehensive metabolic panel   Protein / creatinine ratio, urine   RPR   Place and maintain sequential compression device   Type and screen [facility]    Meds ordered this encounter  Medications   lactated ringers bolus 1,000 mL   lactated ringers infusion   sodium citrate-citric acid (ORACIT) solution 30 mL     Available prenatal records reviewed.  Patient meets criteria for gestational hypertension at this time with mild range BP >4 hours apart. As she is [redacted]w[redacted]d, will proceed with delivery tonight. Did obtain preE labs given current symptom of vision changes and mild  range pressures. Fetal status reassuring with reactive tracing.  ASSESSMENT 1. Gestational hypertension, third trimester   2. History of HELLP, currently pregnant   3. History of postpartum hemorrhage   4. History of cesarean section   5. Obesity affecting pregnancy, antepartum, unspecified obesity type   6. Supervision of high risk pregnancy in third trimester   7. Insulin controlled gestational diabetes mellitus (GDM) in third trimester     PLAN Admit to L&D for repeat C/S w/BTL  Wylene Simmer, MD [facility] Fellow 03/05/2023  8:44 PM

## 2023-03-06 ENCOUNTER — Encounter (HOSPITAL_COMMUNITY): Payer: Self-pay | Admitting: Obstetrics and Gynecology

## 2023-03-06 DIAGNOSIS — Z98891 History of uterine scar from previous surgery: Secondary | ICD-10-CM

## 2023-03-06 LAB — CBC
HCT: 25.7 % — ABNORMAL LOW (ref 36.0–46.0)
Hemoglobin: 8.1 g/dL — ABNORMAL LOW (ref 12.0–15.0)
MCH: 25.6 pg — ABNORMAL LOW (ref 26.0–34.0)
MCHC: 31.5 g/dL (ref 30.0–36.0)
MCV: 81.3 fL (ref 80.0–100.0)
Platelets: 126 10*3/uL — ABNORMAL LOW (ref 150–400)
RBC: 3.16 MIL/uL — ABNORMAL LOW (ref 3.87–5.11)
RDW: 13.2 % (ref 11.5–15.5)
WBC: 8.3 10*3/uL (ref 4.0–10.5)
nRBC: 0 % (ref 0.0–0.2)

## 2023-03-06 LAB — CREATININE, SERUM
Creatinine, Ser: 0.68 mg/dL (ref 0.44–1.00)
GFR, Estimated: >60 mL/min (ref >60)

## 2023-03-06 LAB — RPR: RPR Ser Ql: NONREACTIVE

## 2023-03-06 MED ORDER — FUROSEMIDE 20 MG PO TABS
20.0000 mg | ORAL_TABLET | Freq: Every day | ORAL | Status: DC
  Administered 2023-03-06 – 2023-03-07 (×2): 20 mg via ORAL
  Filled 2023-03-06 (×2): qty 1

## 2023-03-06 MED ORDER — SIMETHICONE 80 MG PO CHEW
80.0000 mg | CHEWABLE_TABLET | Freq: Three times a day (TID) | ORAL | Status: DC
Start: 2023-03-06 — End: 2023-03-07
  Administered 2023-03-06 – 2023-03-07 (×4): 80 mg via ORAL
  Filled 2023-03-06 (×4): qty 1

## 2023-03-06 MED ORDER — SENNOSIDES-DOCUSATE SODIUM 8.6-50 MG PO TABS
2.0000 | ORAL_TABLET | Freq: Every day | ORAL | Status: DC
Start: 2023-03-06 — End: 2023-03-07
  Administered 2023-03-06 – 2023-03-07 (×2): 2 via ORAL
  Filled 2023-03-06 (×2): qty 2

## 2023-03-06 MED ORDER — PRENATAL MULTIVITAMIN CH
1.0000 | ORAL_TABLET | Freq: Every day | ORAL | Status: DC
Start: 2023-03-06 — End: 2023-03-07
  Administered 2023-03-06 – 2023-03-07 (×2): 1 via ORAL
  Filled 2023-03-06 (×2): qty 1

## 2023-03-06 MED ORDER — KETOROLAC TROMETHAMINE 30 MG/ML IJ SOLN
INTRAMUSCULAR | Status: AC
  Filled 2023-03-06: qty 1

## 2023-03-06 MED ORDER — MAGNESIUM HYDROXIDE 400 MG/5ML PO SUSP: 30.0000 mL | ORAL | Status: DC | PRN

## 2023-03-06 MED ORDER — GABAPENTIN 100 MG PO CAPS
200.0000 mg | ORAL_CAPSULE | Freq: Every day | ORAL | Status: DC
Start: 2023-03-06 — End: 2023-03-07
  Administered 2023-03-06: 200 mg via ORAL
  Filled 2023-03-06: qty 2

## 2023-03-06 MED ORDER — SIMETHICONE 80 MG PO CHEW: 80.0000 mg | CHEWABLE_TABLET | ORAL | Status: DC | PRN

## 2023-03-06 MED ORDER — OXYTOCIN-SODIUM CHLORIDE 30-0.9 UT/500ML-% IV SOLN: 2.5000 [IU]/h | INTRAVENOUS | Status: AC

## 2023-03-06 MED ORDER — ACETAMINOPHEN 500 MG PO TABS
1000.0000 mg | ORAL_TABLET | Freq: Four times a day (QID) | ORAL | Status: DC
  Administered 2023-03-06 – 2023-03-07 (×5): 1000 mg via ORAL
  Filled 2023-03-06 (×5): qty 2

## 2023-03-06 MED ORDER — LACTATED RINGERS IV SOLN: INTRAVENOUS | Status: AC

## 2023-03-06 MED ORDER — IBUPROFEN 600 MG PO TABS
600.0000 mg | ORAL_TABLET | Freq: Four times a day (QID) | ORAL | Status: DC
  Administered 2023-03-06 – 2023-03-07 (×5): 600 mg via ORAL
  Filled 2023-03-06 (×5): qty 1

## 2023-03-06 MED ORDER — COCONUT OIL OIL: 1.0000 | TOPICAL_OIL | Status: DC | PRN

## 2023-03-06 MED ORDER — KETOROLAC TROMETHAMINE 30 MG/ML IJ SOLN
30.0000 mg | Freq: Four times a day (QID) | INTRAMUSCULAR | Status: AC
  Administered 2023-03-06: 30 mg via INTRAVENOUS
  Filled 2023-03-06: qty 1

## 2023-03-06 MED ORDER — FERROUS SULFATE 325 (65 FE) MG PO TABS
325.0000 mg | ORAL_TABLET | ORAL | Status: DC
  Administered 2023-03-06: 325 mg via ORAL
  Filled 2023-03-06: qty 1

## 2023-03-06 MED ORDER — DIBUCAINE (PERIANAL) 1 % EX OINT: 1.0000 | TOPICAL_OINTMENT | CUTANEOUS | Status: DC | PRN

## 2023-03-06 MED ORDER — TETANUS-DIPHTH-ACELL PERTUSSIS 5-2.5-18.5 LF-MCG/0.5 IM SUSY: 0.5000 mL | PREFILLED_SYRINGE | Freq: Once | INTRAMUSCULAR | Status: DC

## 2023-03-06 MED ORDER — WITCH HAZEL-GLYCERIN EX PADS: 1.0000 | MEDICATED_PAD | CUTANEOUS | Status: DC | PRN

## 2023-03-06 MED ORDER — DIPHENHYDRAMINE HCL 25 MG PO CAPS: 25.0000 mg | ORAL_CAPSULE | Freq: Four times a day (QID) | ORAL | Status: DC | PRN

## 2023-03-06 MED ORDER — MENTHOL 3 MG MT LOZG: 1.0000 | LOZENGE | OROMUCOSAL | Status: DC | PRN

## 2023-03-06 MED ORDER — ENOXAPARIN SODIUM 40 MG/0.4ML IJ SOSY
40.0000 mg | PREFILLED_SYRINGE | INTRAMUSCULAR | Status: DC
Start: 2023-03-06 — End: 2023-03-07
  Administered 2023-03-06 – 2023-03-07 (×2): 40 mg via SUBCUTANEOUS
  Filled 2023-03-06 (×2): qty 0.4

## 2023-03-06 MED ORDER — ZOLPIDEM TARTRATE 5 MG PO TABS: 5.0000 mg | ORAL_TABLET | Freq: Every evening | ORAL | Status: DC | PRN

## 2023-03-06 MED ORDER — OXYCODONE HCL 5 MG PO TABS: 5.0000 mg | ORAL_TABLET | ORAL | Status: DC | PRN

## 2023-03-06 NOTE — Progress Notes (Signed)
POSTPARTUM PROGRESS NOTE  POD #1  Subjective:  Michelle Schultz is a 32 y.o. [id number] s/p repeat LTCS at [redacted]w[redacted]d. No acute events overnight. She reports she is doing well. She denies any problems with ambulating or po intake. Foley catheter just removed, has not voided yet. Denies nausea or vomiting. She has passed flatus. Pain is well controlled.  Lochia is normal.  Objective: Blood pressure 103/78, pulse 60, temperature 97.6 F (36.4 C), temperature source Oral, resp. rate 16, height 5\' 1"  (1.549 m), weight 83.2 kg, last menstrual period 06/11/2022, SpO2 100%, unknown if currently breastfeeding.  Physical Exam:  General: alert, cooperative and no distress Chest: no respiratory distress Heart: regular rate, distal pulses intact Uterine Fundus: firm, appropriately tender DVT Evaluation: No calf swelling or tenderness Extremities: No edema Skin: warm, dry; incision clean/dry/intact w/ honeycomb dressing in place  Recent Labs    03/05/23 2039 03/06/23 0431  HGB 10.2* 8.1*  HCT 31.8* 25.7*    Assessment/Plan: Michelle Schultz is a 32 y.o. [id number] s/p repeat LTCS at [redacted]w[redacted]d after new diagnosis of gHTN.  POD#1 - Doing welll; pain well controlled. H/H appropriate  Routine postpartum care  OOB, ambulated  Lovenox for VTE prophylaxis  Post-operative blood loss anemia: Clinically significant, asymptomatic  Start po ferrous sulfate every other day  Contraception: s/p bilateral salpingectomy Feeding: breast  Dispo: Plan for discharge tomorrow vs Wednesday.   LOS: 1 day   Sundra Aland, MD OB Fellow  03/06/2023, 9:16 AM

## 2023-03-06 NOTE — Lactation Note (Signed)
This note was copied from a baby's chart. Lactation Consultation Note  Patient Name: Michelle Schultz [id number] Date: 03/06/2023 Age:33 hours  Mom chooses to formula feed.   Maternal Data    Feeding    LATCH Score                    Lactation Tools Discussed/Used    Interventions    Discharge    Consult Status Consult Status: Complete    Niccolo Burggraf G 03/06/2023, 12:02 AM

## 2023-03-06 NOTE — Anesthesia Postprocedure Evaluation (Signed)
Anesthesia Post Note  Patient: Chartered loss adjuster  Procedure(s) Performed: CESAREAN SECTION WITH BILATERAL TUBAL LIGATION BILATERAL TUBAL LIGATION (Bilateral)     Patient location during evaluation: PACU Anesthesia Type: Spinal Level of consciousness: oriented and awake and alert Pain management: pain level controlled Vital Signs Assessment: post-procedure vital signs reviewed and stable Respiratory status: spontaneous breathing, respiratory function stable and nonlabored ventilation Cardiovascular status: blood pressure returned to baseline and stable Postop Assessment: no headache, no backache, no apparent nausea or vomiting and spinal receding Anesthetic complications: no   No notable events documented.  Last Vitals:  Vitals:   03/06/23 0000 03/06/23 0015  BP: 109/74 111/73  Pulse: 70 70  Resp: 20 13  Temp:    SpO2: 98% 98%    Last Pain:  Vitals:   03/06/23 0015  TempSrc:   PainSc: 0-No pain   Pain Goal:    LLE Motor Response: Purposeful movement (03/06/23 0015) LLE Sensation: No sensation (absent) (03/06/23 0015) RLE Motor Response: No movement due to regional block (03/06/23 0015) RLE Sensation: No sensation (absent) (03/06/23 0015)     Epidural/Spinal Function Cutaneous sensation: Vague (03/06/23 0015), Patient able to flex knees: Yes (03/06/23 0015), Patient able to lift hips off bed: Yes (03/06/23 0015), Back pain beyond tenderness at insertion site: No (03/06/23 0015), Progressively worsening motor and/or sensory loss: No (03/06/23 0015), Bowel and/or bladder incontinence post epidural: No (03/06/23 0015)  Braydan Marriott A.

## 2023-03-07 ENCOUNTER — Encounter: Payer: BLUE CROSS/BLUE SHIELD | Admitting: Obstetrics & Gynecology

## 2023-03-07 ENCOUNTER — Other Ambulatory Visit: Payer: BLUE CROSS/BLUE SHIELD

## 2023-03-07 ENCOUNTER — Other Ambulatory Visit (HOSPITAL_COMMUNITY): Payer: Self-pay

## 2023-03-07 MED ORDER — OXYCODONE HCL 5 MG PO TABS
5.0000 mg | ORAL_TABLET | ORAL | 0 refills | Status: DC | PRN
  Filled 2023-03-07: qty 15, 3d supply, fill #0

## 2023-03-07 MED ORDER — FERROUS SULFATE 325 (65 FE) MG PO TABS
325.0000 mg | ORAL_TABLET | ORAL | 0 refills | Status: DC
  Filled 2023-03-07: qty 60, 120d supply, fill #0

## 2023-03-07 MED ORDER — FUROSEMIDE 20 MG PO TABS
20.0000 mg | ORAL_TABLET | Freq: Every day | ORAL | 0 refills | Status: DC
  Filled 2023-03-07: qty 5, 5d supply, fill #0

## 2023-03-07 MED ORDER — IBUPROFEN 600 MG PO TABS
600.0000 mg | ORAL_TABLET | Freq: Four times a day (QID) | ORAL | 0 refills | Status: DC
  Filled 2023-03-07: qty 30, 8d supply, fill #0

## 2023-03-08 ENCOUNTER — Ambulatory Visit: Payer: BLUE CROSS/BLUE SHIELD

## 2023-03-08 LAB — SURGICAL PATHOLOGY

## 2023-03-13 ENCOUNTER — Ambulatory Visit: Payer: BLUE CROSS/BLUE SHIELD | Admitting: *Deleted

## 2023-03-13 VITALS — BP 132/92

## 2023-03-13 DIAGNOSIS — O139 Gestational [pregnancy-induced] hypertension without significant proteinuria, unspecified trimester: Secondary | ICD-10-CM

## 2023-03-13 DIAGNOSIS — Z0131 Encounter for examination of blood pressure with abnormal findings: Secondary | ICD-10-CM

## 2023-03-13 MED ORDER — NIFEDIPINE ER OSMOTIC RELEASE 30 MG PO TB24: 30.0000 mg | ORAL_TABLET | Freq: Every day | ORAL | 2 refills | Status: DC

## 2023-03-13 NOTE — Progress Notes (Signed)
Subjective:  Michelle Schultz is a 32 y.o. female here for BP check and incision check.  Hypertension ROS: no chest pain on exertion, no dyspnea on exertion, and no swelling of ankles.   Pt reports incision covered, no issues or concerns.  Objective:  BP (!) 132/92   LMP 06/11/2022   Appearance alert, well appearing, and in no distress. Incision healing well. Honeycomb removed.   Assessment:   Blood Pressure today in office needs improvement.  Incision healed nicely, no signs of infection, scar looks good  Plan:  Discussed BP with Dr Macon Large, will start pt on Procardia XL 30mg  daily, and pt to send a blood pressure in one week .   Scheryl Marten, RN

## 2023-03-14 ENCOUNTER — Encounter: Payer: BLUE CROSS/BLUE SHIELD | Admitting: Obstetrics and Gynecology

## 2023-03-14 ENCOUNTER — Other Ambulatory Visit: Payer: BLUE CROSS/BLUE SHIELD

## 2023-03-15 ENCOUNTER — Encounter: Payer: BLUE CROSS/BLUE SHIELD | Admitting: Family Medicine

## 2023-03-15 ENCOUNTER — Other Ambulatory Visit: Payer: BLUE CROSS/BLUE SHIELD

## 2023-03-15 ENCOUNTER — Encounter (HOSPITAL_COMMUNITY)
Admission: RE | Admit: 2023-03-15 | Discharge: 2023-03-15 | Disposition: A | Discharge status: Home / Self Care | Payer: BLUE CROSS/BLUE SHIELD | Source: Ambulatory Visit | Attending: Obstetrics and Gynecology | Admitting: Obstetrics and Gynecology

## 2023-03-17 ENCOUNTER — Encounter (HOSPITAL_COMMUNITY): Admission: AD | Payer: Self-pay | Source: Home / Self Care

## 2023-03-17 ENCOUNTER — Inpatient Hospital Stay (HOSPITAL_COMMUNITY)
Admission: AD | Admit: 2023-03-17 | Payer: BLUE CROSS/BLUE SHIELD | Source: Home / Self Care | Admitting: Obstetrics and Gynecology

## 2023-03-17 SURGERY — Surgical Case
Anesthesia: Choice | Laterality: Bilateral

## 2023-03-21 ENCOUNTER — Other Ambulatory Visit: Payer: BLUE CROSS/BLUE SHIELD

## 2023-03-21 ENCOUNTER — Encounter: Payer: BLUE CROSS/BLUE SHIELD | Admitting: Obstetrics & Gynecology

## 2023-04-20 ENCOUNTER — Encounter: Payer: Self-pay | Admitting: Obstetrics & Gynecology

## 2023-04-20 ENCOUNTER — Ambulatory Visit (INDEPENDENT_AMBULATORY_CARE_PROVIDER_SITE_OTHER): Payer: BLUE CROSS/BLUE SHIELD | Admitting: Obstetrics & Gynecology

## 2023-04-20 VITALS — BP 127/86 | HR 91 | Wt 167.2 lb

## 2023-04-20 DIAGNOSIS — Z98891 History of uterine scar from previous surgery: Secondary | ICD-10-CM

## 2023-04-20 DIAGNOSIS — Z8759 Personal history of other complications of pregnancy, childbirth and the puerperium: Secondary | ICD-10-CM

## 2023-04-20 DIAGNOSIS — Z8632 Personal history of gestational diabetes: Secondary | ICD-10-CM

## 2023-04-20 NOTE — Progress Notes (Signed)
Post Partum Visit Note  Michelle Schultz is a 32 y.o. [id number] female who presents for a postpartum visit. She is 6 weeks postpartum following a repeat cesarean section.  I have fully reviewed the prenatal and intrapartum course; had GDM and CHTN. The delivery was at [redacted]w[redacted]d gestational weeks.  Anesthesia: spinal. Postpartum course has been well. Baby is doing well. Baby is feeding by bottle - Similac . Bleeding  Period started . Bowel function is normal. Bladder function is normal. Patient is sexually active. Contraception method is tubal ligation. Postpartum depression screening: negative. Wants to defer pap until new year as she is on her period.   The pregnancy intention screening data noted above was reviewed. Potential methods of contraception were discussed. The patient elected to proceed with No data recorded.   Edinburgh Postnatal Depression Scale - 04/20/23 0918       Edinburgh Postnatal Depression Scale:  In the Past 7 Days   I have been able to laugh and see the funny side of things. 0    I have looked forward with enjoyment to things. 0    I have blamed myself unnecessarily when things went wrong. 0    I have been anxious or worried for no good reason. 0    I have felt scared or panicky for no good reason. 0    Things have been getting on top of me. 1    I have been so unhappy that I have had difficulty sleeping. 0    I have felt sad or miserable. 0    I have been so unhappy that I have been crying. 0    The thought of harming myself has occurred to me. 0    Edinburgh Postnatal Depression Scale Total 1             There are no preventive care reminders to display for this patient.  The following portions of the patient's history were reviewed and updated as appropriate: allergies, current medications, past family history, past medical history, past social history, past surgical history, and problem list.  Review of Systems Pertinent items noted in HPI and remainder of  comprehensive ROS otherwise negative.  Objective:  BP 127/86   Pulse 91   Wt 167 lb 3.2 oz (75.8 kg)   LMP 06/11/2022   Breastfeeding No   BMI 31.59 kg/m    General:  alert and no distress   Breasts:  not indicated  Lungs: clear to auscultation bilaterally  Heart:  regular rate and rhythm  Abdomen: soft, non-tender; bowel sounds normal; no masses,  no organomegaly   Wound well approximated incision, incision C/D/I  GU exam:  not indicated       Assessment:   Normal postpartum exam.   Plan:   Essential components of care per ACOG recommendations:  1.  Mood and well being: Patient with negative depression screening today. Reviewed local resources for support.  - Patient tobacco use? No.   - hx of drug use? No.    2. Infant care and feeding:  -Patient currently breastmilk feeding? No.  -Social determinants of health (SDOH) reviewed in EPIC. No concerns.   3. Sexuality, contraception and birth spacing - Patient does not want a pregnancy in the next year.  Desired family size is 3 children.  - Already underwent bilateral salpingectomy  4. Sleep and fatigue -Encouraged family/partner/community support of 4 hrs of uninterrupted sleep to help with mood and fatigue  5. Physical Recovery  -  Discussed patients delivery and complications. - Patient had a C-section repeat; no problems after delivery.  - Patient has urinary incontinence? No. - Patient is safe to resume physical and sexual activity  6.  Health Maintenance - HM due items addressed Yes - Last pap smear  Diagnosis  Date Value Ref Range Status  01/14/2020   Final   - Negative for intraepithelial lesion or malignancy (NILM)   Pap smear not done at today's visit.  -Breast Cancer screening indicated? No.   7. Chronic Disease/Pregnancy Condition follow up:  #GDM: Return for postpartum 2 hr GTT soon #CHTN:  PCP follow up  Jaynie Collins, MD Center for [facility], [facility]

## 2023-04-20 NOTE — Patient Instructions (Signed)
Return for fasting postpartum 2 hr GTT and pap smear

## 2023-05-23 ENCOUNTER — Ambulatory Visit: Payer: BLUE CROSS/BLUE SHIELD | Admitting: Obstetrics & Gynecology

## 2023-05-23 ENCOUNTER — Other Ambulatory Visit: Payer: BLUE CROSS/BLUE SHIELD

## 2024-01-11 ENCOUNTER — Ambulatory Visit: Admitting: Pediatrics

## 2024-01-11 ENCOUNTER — Encounter: Payer: Self-pay | Admitting: Pediatrics

## 2024-01-11 VITALS — BP 126/91 | HR 96 | Temp 98.1°F | Ht 61.0 in | Wt 151.8 lb

## 2024-01-11 DIAGNOSIS — R030 Elevated blood-pressure reading, without diagnosis of hypertension: Secondary | ICD-10-CM | POA: Insufficient documentation

## 2024-01-11 DIAGNOSIS — I10 Essential (primary) hypertension: Secondary | ICD-10-CM | POA: Diagnosis not present

## 2024-01-11 DIAGNOSIS — Z6828 Body mass index (BMI) 28.0-28.9, adult: Secondary | ICD-10-CM | POA: Insufficient documentation

## 2024-01-11 MED ORDER — ZEPBOUND 2.5 MG/0.5ML ~~LOC~~ SOAJ: 2.5000 mg | SUBCUTANEOUS | 0 refills | Status: DC

## 2024-01-11 NOTE — Patient Instructions (Signed)

## 2024-01-11 NOTE — Assessment & Plan Note (Addendum)
 Using compounded tirzepatide  effective with 12-pound weight loss, no side effects. Discussed insurance coverage for [facility] as an alternative. She is a GLP1 candidate by BMI and comorbid HTN.  - Continue regular exercise program and nutritional changes - Submit insurance request for tirzepatide . - Consider Georjean if covered by insurance. - Continue current GLP-1 dose. - Monitor weight loss, target 1-2 pounds per week, max 10 pounds per month.

## 2024-01-11 NOTE — Assessment & Plan Note (Signed)
 Slightly elevated, expected to decrease with weight loss. No immediate medication needed. - Monitor blood pressure regularly.

## 2024-01-11 NOTE — Progress Notes (Signed)
 Establish Care Note  BP (!) 126/91 (BP Location: Right Arm, Patient Position: Sitting, Cuff Size: Large)   Pulse 96   Temp 98.1 F (36.7 C)   Ht 5' 1 (1.549 m)   Wt 151 lb 12.8 oz (68.9 kg)   LMP 01/01/2024 (Within Weeks)   SpO2 98%   BMI 28.68 kg/m    Subjective:    Patient ID: Michelle Schultz, female    DOB: 1991/04/07, 33 y.o.   MRN: [id number]  HPI: Michelle Schultz is a 33 y.o. female  Chief Complaint  Patient presents with   Establish Care    Pt presents today to establish care and would like to get a physical done. Pt states she takes GP1 and would like to get it through her insurance.     Establishing care, the following was discussed today:  Discussed the use of AI scribe software for clinical note transcription with the patient, who gave verbal consent to proceed.  History of Present Illness   Michelle Schultz is a 33 year old female who presents for management of GLP-1 medication for weight loss.  She started taking tirzepatide , a GLP-1 medication, approximately four to five weeks ago and has experienced significant satisfaction with the treatment, noting a weight loss of twelve pounds since initiation. She is currently on a self-pay program for the medication, which costs $200 per vial from a telehealth company called [employer].  She has not experienced any side effects from the medication. She is considering insurance coverage options to potentially switch to a different GLP-1 medication, such as Georjean, which is often covered by insurance.  She is not currently on any medications for hypertension. She is hopeful that continued weight loss will help manage her blood pressure.  She is not taking any other medications at this time and is due for a physical examination, including routine yearly labs to check sugar, cholesterol, electrolytes, liver function, and kidney function.      Current Outpatient Medications on File Prior to Visit  Medication Sig Dispense Refill    ferrous sulfate  325 (65 FE) MG tablet Take 1 tablet (325 mg total) by mouth every other day. 60 tablet 0   ibuprofen  (ADVIL ) 600 MG tablet Take 1 tablet (600 mg total) by mouth every 6 (six) hours. 30 tablet 0   Prenatal Vit-Fe Fumarate-FA (PRENATAL MULTIVITAMIN) TABS tablet Take 1 tablet by mouth daily at 12 noon. (Patient not taking: Reported on 01/11/2024)     No current facility-administered medications on file prior to visit.    #HM Will review HM records and updated as needed.  Relevant past medical, surgical, family and social history reviewed and updated as indicated. Interim medical history since our last visit reviewed. Allergies and medications reviewed and updated.  ROS per HPI unless specifically indicated above     Objective:    BP (!) 126/91 (BP Location: Right Arm, Patient Position: Sitting, Cuff Size: Large)   Pulse 96   Temp 98.1 F (36.7 C)   Ht 5' 1 (1.549 m)   Wt 151 lb 12.8 oz (68.9 kg)   LMP 01/01/2024 (Within Weeks)   SpO2 98%   BMI 28.68 kg/m   Wt Readings from Last 3 Encounters:  01/11/24 151 lb 12.8 oz (68.9 kg)  04/20/23 167 lb 3.2 oz (75.8 kg)  03/05/23 183 lb 8 oz (83.2 kg)     Physical Exam Constitutional:      Appearance: Normal appearance.  Pulmonary:     Effort: Pulmonary effort  is normal.  Musculoskeletal:        General: Normal range of motion.  Skin:    Comments: Normal skin color  Neurological:     General: No focal deficit present.     Mental Status: She is alert. Mental status is at baseline.  Psychiatric:        Mood and Affect: Mood normal.        Behavior: Behavior normal.        Thought Content: Thought content normal.         01/11/2024   10:35 AM 02/28/2023   12:54 PM 12/20/2022    8:56 AM 12/08/2022    2:10 PM 05/27/2021    3:06 PM  Depression screen PHQ 2/9  Decreased Interest 0 0 0 0 0  Down, Depressed, Hopeless 0 0 0 0 0  PHQ - 2 Score 0 0 0 0 0  Altered sleeping 0 0 0 0   Tired, decreased energy 0 1 1 1     Change in appetite 0 0 0 0   Feeling bad or failure about yourself  0 0 0 0   Trouble concentrating 0 0 0 0   Moving slowly or fidgety/restless 0 0 0 0   Suicidal thoughts 0 0 0 0   PHQ-9 Score 0 1 1 1    Difficult doing work/chores  Not difficult at all  Not difficult at all         01/11/2024   10:35 AM 02/28/2023   12:54 PM 12/20/2022    8:56 AM 12/08/2022    2:10 PM  GAD 7 : Generalized Anxiety Score  Nervous, Anxious, on Edge 0 0 0 1  Control/stop worrying 0 0 0 0  Worry too much - different things 0 0 0 0  Trouble relaxing 0 0 0 0  Restless 0 0 0 0  Easily annoyed or irritable 0 1 1 1   Afraid - awful might happen 0 0 0 0  Total GAD 7 Score 0 1 1 2   Anxiety Difficulty  Not difficult at all         Assessment & Plan:  Assessment & Plan   BMI 28.0-28.9,adult Assessment & Plan: Using compounded tirzepatide  effective with 12-pound weight loss, no side effects. Discussed insurance coverage for [facility] as an alternative. [employer] insurance request for tirzepatide . - Consider Georjean if covered by insurance. - Continue current GLP-1 dose. - Monitor weight loss, target 1-2 pounds per week, max 10 pounds per month.  Orders: -     Lipid panel -     Hemoglobin A1c -     Comprehensive metabolic panel with GFR -     CBC with Differential/Platelet -     Zepbound ; Inject 2.5 mg into the skin once a week.  Dispense: 2 mL; Refill: 0  Primary hypertension Assessment & Plan: Slightly elevated, expected to decrease with weight loss. No immediate medication needed. - Monitor blood pressure regularly.  Orders: -     Zepbound ; Inject 2.5 mg into the skin once a week.  Dispense: 2 mL; Refill: 0   Clinical coverage for weight loss GLP's  Medication being dispensed is Zepbound  2 mL/28 days. Titration doses are 2 mL/28 days.  [x]  Product being prescribed is FDA approved for the indication, age, weight (if applicable) and not does not exceed dosing limits per the Prescribing  Information per the clinical conditions for use.  [x]  Patient's baseline weight measured within the last 45 days as required by provider  before dispensing.  [x]  Patient is weight management therapy and One of the following:   [x]  The beneficiary is 34 years of age or over and has ONE of the following:  []  A BMI greater than or equal to 30 kg/m2  [x]  A BMI greater than or equal to 27 kg/m2 with at least one weight-related comorbidity/risk factor/complication (i.e. hypertension, type 2 diabetes, obstructive sleep apnea, cardiovascular disease, dyslipidemia)   If patient has one weight-related comorbidity/risk factor/complication (i.e. hypertension, type 2 diabetes, obstructive sleep apnea, cardiovascular disease, dyslipidemia), please list HYPERTENSION  [x]  The beneficiary has participated in 6 months or greater of lifestyle modification and will continue while on medication including structured nutrition following provider recommend dietary modifications and physical activity, unless physical activity is not clinically appropriate at the time GLP1 therapy commences AND  [x]  The beneficiary will NOT be using the requested agent in combination with another GLP-1 receptor agonist agent AND  [x]  The beneficiary does NOT have any FDA-labeled contraindications to the requested agent, including pregnancy, lactation, history of medullary thyroid cancer or multiple endocrine neoplasia type II.   Last BMI/Weight/Height recorded Estimated body mass index is 28.68 kg/m as calculated from the following:   Height as of this encounter: 5' 1 (1.549 m).   Weight as of this encounter: 151 lb 12.8 oz (68.9 kg).   Follow up plan: Return in about 4 weeks (around 02/08/2024) for Physical.  [name], MD

## 2024-01-12 LAB — CBC WITH DIFFERENTIAL/PLATELET
Basophils Absolute: 0 x10E3/uL (ref 0.0–0.2)
Basos: 1 %
EOS (ABSOLUTE): 0.1 x10E3/uL (ref 0.0–0.4)
Eos: 3 %
Hematocrit: 45 % (ref 34.0–46.6)
Hemoglobin: 14.6 g/dL (ref 11.1–15.9)
Immature Grans (Abs): 0 x10E3/uL (ref 0.0–0.1)
Immature Granulocytes: 0 %
Lymphocytes Absolute: 0.9 x10E3/uL (ref 0.7–3.1)
Lymphs: 23 %
MCH: 28.4 pg (ref 26.6–33.0)
MCHC: 32.4 g/dL (ref 31.5–35.7)
MCV: 88 fL (ref 79–97)
Monocytes Absolute: 0.3 x10E3/uL (ref 0.1–0.9)
Monocytes: 9 %
Neutrophils Absolute: 2.6 x10E3/uL (ref 1.4–7.0)
Neutrophils: 64 %
Platelets: 236 x10E3/uL (ref 150–450)
RBC: 5.14 x10E6/uL (ref 3.77–5.28)
RDW: 13.7 % (ref 11.7–15.4)
WBC: 4 x10E3/uL (ref 3.4–10.8)

## 2024-01-12 LAB — COMPREHENSIVE METABOLIC PANEL WITH GFR
ALT: 20 IU/L (ref 0–32)
AST: 19 IU/L (ref 0–40)
Albumin: 4.7 g/dL (ref 3.9–4.9)
Alkaline Phosphatase: 50 IU/L (ref 44–121)
BUN/Creatinine Ratio: 10 (ref 9–23)
BUN: 9 mg/dL (ref 6–20)
Bilirubin Total: 0.5 mg/dL (ref 0.0–1.2)
CO2: 21 mmol/L (ref 20–29)
Calcium: 9.8 mg/dL (ref 8.7–10.2)
Chloride: 104 mmol/L (ref 96–106)
Creatinine, Ser: 0.88 mg/dL (ref 0.57–1.00)
Globulin, Total: 2.6 g/dL (ref 1.5–4.5)
Glucose: 84 mg/dL (ref 70–99)
Potassium: 4.2 mmol/L (ref 3.5–5.2)
Sodium: 139 mmol/L (ref 134–144)
Total Protein: 7.3 g/dL (ref 6.0–8.5)
eGFR: 88 mL/min/1.73 (ref >59)

## 2024-01-12 LAB — LIPID PANEL
Chol/HDL Ratio: 2.6 ratio (ref 0.0–4.4)
Cholesterol, Total: 158 mg/dL (ref 100–199)
HDL: 60 mg/dL (ref >39)
LDL Chol Calc (NIH): 87 mg/dL (ref 0–99)
Triglycerides: 54 mg/dL (ref 0–149)
VLDL Cholesterol Cal: 11 mg/dL (ref 5–40)

## 2024-01-12 LAB — HEMOGLOBIN A1C
Est. average glucose Bld gHb Est-mCnc: 103 mg/dL
Hgb A1c MFr Bld: 5.2 % (ref 4.8–5.6)

## 2024-01-16 ENCOUNTER — Ambulatory Visit: Payer: Self-pay | Admitting: Pediatrics

## 2024-01-31 ENCOUNTER — Other Ambulatory Visit: Payer: Self-pay | Admitting: Pediatrics

## 2024-01-31 DIAGNOSIS — Z6828 Body mass index (BMI) 28.0-28.9, adult: Secondary | ICD-10-CM

## 2024-01-31 DIAGNOSIS — I10 Essential (primary) hypertension: Secondary | ICD-10-CM

## 2024-01-31 NOTE — Telephone Encounter (Signed)
 Copied from CRM #[id number]. Topic: Clinical - Medication Refill >> Jan 31, 2024  1:21 PM Antwanette L wrote: Medication: tirzepatide  (ZEPBOUND ) 2.5 MG/0.5ML Pen  Has the patient contacted their pharmacy? Yes   This is the patient's preferred pharmacy:    [id number] - [place], [place] - [address] AT [facility] [address] [place] [address] Phone: [phone] Fax: [phone]   Is this the correct pharmacy for this prescription? Yes    Has the prescription been filled recently? Yes. Last refill was on 01/11/24 but [facility] has not [facility] an order from Dr. Herold  Is the patient out of the medication? Yes  Has the patient been seen for an appointment in the last year OR does the patient have an upcoming appointment? Yes. Last ov with Dr. Herold was on 01/11/24  Can we respond through MyChart? No. Contact the patient by phone at [phone]  Agent: Please be advised that Rx refills may take up to 3 business days. We ask that you follow-up with your pharmacy.

## 2024-02-01 ENCOUNTER — Telehealth: Payer: Self-pay

## 2024-02-01 NOTE — Telephone Encounter (Signed)
 Pt.notified

## 2024-02-01 NOTE — Telephone Encounter (Signed)
 Requested medications are due for refill today.  yes  Requested medications are on the active medications list.  yes  Last refill. 01/11/2024 2mL 0 rf  Future visit scheduled.   yes  Notes to clinic.  Medication not assigned to a protocol. Please review for refill.    Requested Prescriptions  Pending Prescriptions Disp Refills   tirzepatide  (ZEPBOUND ) 2.5 MG/0.5ML Pen 2 mL 0    Sig: Inject 2.5 mg into the skin once a week.     Off-Protocol Failed - 02/01/2024  5:23 PM      Failed - Medication not assigned to a protocol, review manually.      Passed - Valid encounter within last 12 months    Recent Outpatient Visits           3 weeks ago BMI 28.0-28.9,adult   [place] [facility] Herold Hadassah SQUIBB, MD

## 2024-02-01 NOTE — Telephone Encounter (Signed)
 Copied from CRM [id number]. Topic: Clinical - Prescription Issue >> Feb 01, 2024  1:23 PM Michelle Schultz wrote: Reason for CRM: Patient tried to pick up Zepbound  at the pharmacy but the cost was $1200. Patient requesting a prescription that will be covered by her insurance. Meagan [phone]

## 2024-02-05 ENCOUNTER — Telehealth: Payer: Self-pay

## 2024-02-05 ENCOUNTER — Other Ambulatory Visit (HOSPITAL_COMMUNITY): Payer: Self-pay

## 2024-02-05 NOTE — Telephone Encounter (Signed)
 Copied from CRM [id number]. Topic: Clinical - Medication Prior Auth >> Feb 05, 2024  9:33 AM Michelle Schultz wrote: Reason for CRM: Patients tirzepatide  (ZEPBOUND ) 2.5 MG/0.5ML Pen [[id number]] needs to be sent to insurance for Prior Authorization//Patient also stated that the insurance would be faxing over information to the office about authorization

## 2024-02-06 MED ORDER — ZEPBOUND 2.5 MG/0.5ML ~~LOC~~ SOAJ: 2.5000 mg | SUBCUTANEOUS | 0 refills | Status: AC

## 2024-02-06 NOTE — Telephone Encounter (Signed)
 Pharmacy Patient Advocate Encounter  Received notification from [employer] that Prior Authorization for Zepbound  2.5MG /0.5ML pen-injectors has been DENIED.  Full denial letter will be uploaded to the media tab. See denial reason below.  We reviewed the information you provided in support of a request to obtain Zepbound  2.5 mg/0.5 PEN INJCTR under your patient's plan. We are unable to approve this request for the following reason(s): ? Coverage is provided in situations when documentation has been provided to confirm that when at baseline, the patient had a body mass index of greater than or equal to 32 kilograms per meter squared (32 kg/m2); or, when at baseline, the patient had a body mass index greater than or equal to 27 kg/m2 and the patient had, or the patient currently has, at least TWO of the following weight-related comorbidities: hypertension, type 2 diabetes, dyslipidemia, obstructive sleep apnea, cardiovascular disease, knee osteoarthritis, asthma, chronic obstructive pulmonary disease, metabolic dysfunctionassociated steatotic liver disease/non-alcoholic fatty liver disease, polycystic ovarian syndrome, or coronary artery disease. This refers to baseline prior to any glucagon-like peptide-1 (GLP-1) agonist (for example, liraglutide [Saxenda, generic], Tzhncb) or GLP1/glucose-dependent insulinotropic polypeptide (GIP) receptor agonist (for example, Zepbound ). Coverage cannot be authorized at this time. PA #/Case ID/Reference #: [id number]

## 2024-02-06 NOTE — Telephone Encounter (Signed)
 Attempted to reach pt to provide information below left vm   OKAY FOR E2C2 to provide pt information below

## 2024-02-07 NOTE — Telephone Encounter (Signed)
 In reviewing providers note I only see patient has 1 listed co-morbidity hypertension and her current plan requires at least two based on her BMI being 27-31.99. Will send to provider to verify that and then can follow up with patient.

## 2024-02-09 ENCOUNTER — Encounter: Admitting: Pediatrics

## 2024-02-09 NOTE — Telephone Encounter (Signed)
 Patient notified via my chart message.

## 2024-02-13 ENCOUNTER — Encounter: Admitting: Pediatrics

## 2024-03-01 ENCOUNTER — Encounter: Payer: Self-pay | Admitting: Pediatrics

## 2024-03-01 ENCOUNTER — Ambulatory Visit (INDEPENDENT_AMBULATORY_CARE_PROVIDER_SITE_OTHER): Admitting: Pediatrics

## 2024-03-01 VITALS — BP 133/88 | HR 93 | Temp 98.7°F | Ht 61.0 in | Wt 146.4 lb

## 2024-03-01 DIAGNOSIS — Z0000 Encounter for general adult medical examination without abnormal findings: Secondary | ICD-10-CM

## 2024-03-01 DIAGNOSIS — Z1330 Encounter for screening examination for mental health and behavioral disorders, unspecified: Secondary | ICD-10-CM

## 2024-03-01 NOTE — Progress Notes (Signed)
 BP 133/88   Pulse 93   Temp 98.7 F (37.1 C) (Oral)   Ht 5' 1 (1.549 m)   Wt 146 lb 6.4 oz (66.4 kg)   LMP 02/02/2024 (Approximate)   SpO2 100%   Breastfeeding No   BMI 27.66 kg/m    Annual Physical Exam - Female  Subjective:   CC: Annual Exam   Michelle Schultz is a 33 y.o. female patient here for a preventative health maintenance exam and has no acute complaints.  Health Habits: DIET: in general, a healthy diet   EXERCISE: active   DENTAL EXAM: Up to Date EYE EXAM: Up to Date                       Relevant Gynecologic History LMP: Patient's last menstrual period was 02/02/2024 (approximate).  Menstrual Status: premenopausal, Flow regular every month without intermenstrual spotting PAP History:  Result Date Procedure Results Follow-ups  01/14/2020 Cytology - PAP High risk HPV: Negative Adequacy: Satisfactory for evaluation; transformation zone component PRESENT. Diagnosis: - Negative for intraepithelial lesion or malignancy (NILM) Comment: Normal Reference Range HPV - Negative   07/06/2016 HM PAP SMEAR      History abnormal PAP: No   Family history breast, ovarian cancer: No Domestic Violence Screen, feels safe at home: Yes  family history includes Heart attack in her maternal grandfather and paternal grandfather; Hypertension in her mother.  Social History   Tobacco Use   Smoking status: Former    Types: Cigarettes   Smokeless tobacco: Never   Tobacco comments:    1 pack/week-quit 2017  Vaping Use   Vaping status: Never Used  Substance Use Topics   Alcohol use: Not Currently    Comment: prior to pregnancy   Drug use: Never   Social History   Social History Narrative   Not on file    Social drivers questionnaire is reviewed and is positive for : none  Depression Screening:     03/01/2024    4:00 PM 01/11/2024   10:35 AM 02/28/2023   12:54 PM 12/20/2022    8:56 AM 12/08/2022    2:10 PM  Depression screen PHQ 2/9  Decreased Interest 0 0 0 0 0   Down, Depressed, Hopeless 0 0 0 0 0  PHQ - 2 Score 0 0 0 0 0  Altered sleeping 0 0 0 0 0  Tired, decreased energy 0 0 1 1 1   Change in appetite 0 0 0 0 0  Feeling bad or failure about yourself  0 0 0 0 0  Trouble concentrating 0 0 0 0 0  Moving slowly or fidgety/restless 0 0 0 0 0  Suicidal thoughts 0 0 0 0 0  PHQ-9 Score 0  0  1  1  1    Difficult doing work/chores Not difficult at all  Not difficult at all  Not difficult at all     Data saved with a previous flowsheet row definition       03/01/2024    4:01 PM 01/11/2024   10:35 AM 02/28/2023   12:54 PM 12/20/2022    8:56 AM  GAD 7 : Generalized Anxiety Score  Nervous, Anxious, on Edge 0 0 0 0  Control/stop worrying 0 0 0 0  Worry too much - different things 0 0 0 0  Trouble relaxing 0 0 0 0  Restless 0 0 0 0  Easily annoyed or irritable 0 0 1 1  Afraid - awful might  happen 0 0 0 0  Total GAD 7 Score 0 0 1 1  Anxiety Difficulty Not difficult at all  Not difficult at all     Mental Health Plan: CTM  Self Management Goals  Goals   None     Health Maintenance Colon Cancer Screening : Not applicable Mammogram : Not applicable DXA scan : Not applicable Immunizations : up to date and documented  Review of Systems See HPI for relevant ROS.  Outpatient Medications Prior to Visit  Medication Sig Dispense Refill   tirzepatide  (ZEPBOUND ) 2.5 MG/0.5ML Pen Inject 2.5 mg into the skin once a week. 2 mL 0   ferrous sulfate  325 (65 FE) MG tablet Take 1 tablet (325 mg total) by mouth every other day. 60 tablet 0   ibuprofen  (ADVIL ) 600 MG tablet Take 1 tablet (600 mg total) by mouth every 6 (six) hours. 30 tablet 0   Prenatal Vit-Fe Fumarate-FA (PRENATAL MULTIVITAMIN) TABS tablet Take 1 tablet by mouth daily at 12 noon. (Patient not taking: Reported on 01/11/2024)     No facility-administered medications prior to visit.     Patient Active Problem List   Diagnosis Date Noted   BMI 28.0-28.9,adult 01/11/2024   Elevated  blood pressure reading 01/11/2024    Objective:   Vitals:   03/01/24 1554 03/01/24 1640  BP: 133/88   Pulse: (!) 116 93  Temp: 98.7 F (37.1 C)   Height: 5' 1 (1.549 m)   Weight: 146 lb 6.4 oz (66.4 kg)   SpO2: 100%   TempSrc: Oral   BMI (Calculated): 27.68     Body mass index is 27.66 kg/m.  Physical Exam Constitutional:      Appearance: Normal appearance.  HENT:     Head: Normocephalic and atraumatic.  Eyes:     Pupils: Pupils are equal, round, and reactive to light.  Cardiovascular:     Rate and Rhythm: Normal rate and regular rhythm.     Pulses: Normal pulses.     Heart sounds: Normal heart sounds.  Pulmonary:     Effort: Pulmonary effort is normal.     Breath sounds: Normal breath sounds.  Abdominal:     General: Abdomen is flat.     Palpations: Abdomen is soft.  Musculoskeletal:        General: Normal range of motion.     Cervical back: Normal range of motion.  Skin:    General: Skin is warm and dry.     Capillary Refill: Capillary refill takes less than 2 seconds.  Neurological:     General: No focal deficit present.     Mental Status: She is alert. Mental status is at baseline.  Psychiatric:        Mood and Affect: Mood normal.        Behavior: Behavior normal.     Assessment and Plan:   Annual physical exam Discussed lifestyle modifications and goals including plant based eating styles (such as: Mediterranean eating style), regular exercise (at least 150 min of moderate-intensity aerobic exercise per week, given AHA workout handout), get adequate sleep, and continue working with PCP towards meeting health goals to ensure healthy aging.   Encounter for behavioral health screening As part of their intake evaluation, the patient was screened for depression, anxiety.  PHQ9 SCORE 0, GAD7 SCORE 0. Screening results negative for tested conditions. See plan under problem/diagnosis above.   This plan was discussed with the patient and questions were  answered. There were no further  concerns.  Follow up as indicated, or sooner should any new problems arise, if conditions worsen, or if they are otherwise concerned.   See patient instructions for additional information.  Michelle SHAUNNA Nett, MD  Family Medicine      Future Appointments  Date Time Provider Department Center  06/07/2024 10:00 AM Melvin Pao, NP [facility] [address]

## 2024-03-08 ENCOUNTER — Encounter: Payer: Self-pay | Admitting: Pediatrics

## 2024-06-07 ENCOUNTER — Encounter: Payer: Self-pay | Admitting: Nurse Practitioner

## 2024-06-07 ENCOUNTER — Encounter: Admitting: Nurse Practitioner

## 2024-10-24 ENCOUNTER — Encounter: Admitting: Nurse Practitioner
